# Patient Record
Sex: Female | Born: 1975 | Race: White | Hispanic: No | Marital: Married | Smoking: Former smoker
Health system: Southern US, Community
[De-identification: ages and names within clinical notes are randomized; demographics above are authoritative.]

## PROBLEM LIST (undated history)

## (undated) VITALS — BP 107/89 | HR 89 | Temp 98.3°F | Resp 16 | Ht 66.0 in | Wt 120.0 lb

## (undated) DIAGNOSIS — F32A Depression, unspecified: Secondary | ICD-10-CM

## (undated) DIAGNOSIS — N390 Urinary tract infection, site not specified: Secondary | ICD-10-CM

## (undated) DIAGNOSIS — F1110 Opioid abuse, uncomplicated: Secondary | ICD-10-CM

## (undated) DIAGNOSIS — F329 Major depressive disorder, single episode, unspecified: Secondary | ICD-10-CM

## (undated) DIAGNOSIS — F1910 Other psychoactive substance abuse, uncomplicated: Secondary | ICD-10-CM

## (undated) HISTORY — PX: TUBAL LIGATION: SHX77

## (undated) HISTORY — PX: CHOLECYSTECTOMY: SHX55

---

## 2003-04-17 ENCOUNTER — Emergency Department (HOSPITAL_COMMUNITY): Admission: EM | Admit: 2003-04-17 | Discharge: 2003-04-17 | Payer: Self-pay | Admitting: Emergency Medicine

## 2003-07-15 ENCOUNTER — Emergency Department (HOSPITAL_COMMUNITY): Admission: EM | Admit: 2003-07-15 | Discharge: 2003-07-15 | Payer: Self-pay | Admitting: Emergency Medicine

## 2003-07-20 ENCOUNTER — Emergency Department (HOSPITAL_COMMUNITY): Admission: EM | Admit: 2003-07-20 | Discharge: 2003-07-20 | Payer: Self-pay | Admitting: Emergency Medicine

## 2004-01-31 ENCOUNTER — Emergency Department (HOSPITAL_COMMUNITY): Admission: EM | Admit: 2004-01-31 | Discharge: 2004-02-01 | Payer: Self-pay | Admitting: Emergency Medicine

## 2004-07-14 ENCOUNTER — Emergency Department (HOSPITAL_COMMUNITY): Admission: EM | Admit: 2004-07-14 | Discharge: 2004-07-14 | Payer: Self-pay | Admitting: Emergency Medicine

## 2004-08-17 ENCOUNTER — Emergency Department (HOSPITAL_COMMUNITY): Admission: EM | Admit: 2004-08-17 | Discharge: 2004-08-17 | Payer: Self-pay | Admitting: Family Medicine

## 2004-08-29 ENCOUNTER — Emergency Department (HOSPITAL_COMMUNITY): Admission: EM | Admit: 2004-08-29 | Discharge: 2004-08-29 | Payer: Self-pay | Admitting: *Deleted

## 2004-09-06 ENCOUNTER — Emergency Department (HOSPITAL_COMMUNITY): Admission: EM | Admit: 2004-09-06 | Discharge: 2004-09-06 | Payer: Self-pay | Admitting: Emergency Medicine

## 2004-12-26 ENCOUNTER — Emergency Department (HOSPITAL_COMMUNITY): Admission: EM | Admit: 2004-12-26 | Discharge: 2004-12-26 | Payer: Self-pay | Admitting: *Deleted

## 2005-03-20 ENCOUNTER — Emergency Department (HOSPITAL_COMMUNITY): Admission: EM | Admit: 2005-03-20 | Discharge: 2005-03-20 | Payer: Self-pay | Admitting: Emergency Medicine

## 2005-07-07 ENCOUNTER — Emergency Department (HOSPITAL_COMMUNITY): Admission: EM | Admit: 2005-07-07 | Discharge: 2005-07-07 | Payer: Self-pay | Admitting: Emergency Medicine

## 2005-09-28 ENCOUNTER — Emergency Department (HOSPITAL_COMMUNITY): Admission: EM | Admit: 2005-09-28 | Discharge: 2005-09-28 | Payer: Self-pay | Admitting: Emergency Medicine

## 2008-04-15 ENCOUNTER — Emergency Department (HOSPITAL_COMMUNITY): Admission: EM | Admit: 2008-04-15 | Discharge: 2008-04-15 | Payer: Self-pay | Admitting: Emergency Medicine

## 2008-06-28 ENCOUNTER — Emergency Department (HOSPITAL_COMMUNITY): Admission: EM | Admit: 2008-06-28 | Discharge: 2008-06-28 | Payer: Self-pay | Admitting: Emergency Medicine

## 2008-06-30 ENCOUNTER — Emergency Department (HOSPITAL_COMMUNITY): Admission: EM | Admit: 2008-06-30 | Discharge: 2008-06-30 | Payer: Self-pay | Admitting: Emergency Medicine

## 2008-08-26 ENCOUNTER — Encounter: Admission: RE | Admit: 2008-08-26 | Discharge: 2008-08-26 | Payer: Self-pay | Admitting: Obstetrics and Gynecology

## 2009-03-24 ENCOUNTER — Emergency Department (HOSPITAL_COMMUNITY): Admission: EM | Admit: 2009-03-24 | Discharge: 2009-03-24 | Payer: Self-pay | Admitting: Emergency Medicine

## 2009-06-08 ENCOUNTER — Emergency Department (HOSPITAL_COMMUNITY): Admission: EM | Admit: 2009-06-08 | Discharge: 2009-06-09 | Payer: Self-pay | Admitting: Emergency Medicine

## 2009-06-15 ENCOUNTER — Emergency Department (HOSPITAL_COMMUNITY): Admission: EM | Admit: 2009-06-15 | Discharge: 2009-06-15 | Payer: Self-pay | Admitting: Emergency Medicine

## 2009-10-06 ENCOUNTER — Emergency Department (HOSPITAL_COMMUNITY): Admission: EM | Admit: 2009-10-06 | Discharge: 2009-10-06 | Payer: Self-pay | Admitting: Emergency Medicine

## 2009-11-29 ENCOUNTER — Emergency Department (HOSPITAL_COMMUNITY): Admission: EM | Admit: 2009-11-29 | Discharge: 2009-11-29 | Payer: Self-pay | Admitting: Emergency Medicine

## 2009-12-02 ENCOUNTER — Inpatient Hospital Stay (HOSPITAL_COMMUNITY): Admission: AD | Admit: 2009-12-02 | Discharge: 2009-12-02 | Payer: Self-pay | Admitting: Obstetrics & Gynecology

## 2009-12-02 ENCOUNTER — Ambulatory Visit: Payer: Self-pay | Admitting: Gynecology

## 2010-01-25 ENCOUNTER — Emergency Department (HOSPITAL_COMMUNITY): Admission: EM | Admit: 2010-01-25 | Discharge: 2010-01-25 | Payer: Self-pay | Admitting: Emergency Medicine

## 2010-08-25 LAB — POCT I-STAT, CHEM 8
BUN: 10 mg/dL (ref 6–23)
Calcium, Ion: 1.15 mmol/L (ref 1.12–1.32)
Creatinine, Ser: 0.9 mg/dL (ref 0.4–1.2)
Glucose, Bld: 88 mg/dL (ref 70–99)
Hemoglobin: 14.6 g/dL (ref 12.0–15.0)
TCO2: 25 mmol/L (ref 0–100)

## 2010-08-25 LAB — CBC
HCT: 40.7 % (ref 36.0–46.0)
MCH: 33.6 pg (ref 26.0–34.0)
MCV: 97.6 fL (ref 78.0–100.0)
Platelets: 220 10*3/uL (ref 150–400)
RBC: 4.17 MIL/uL (ref 3.87–5.11)
WBC: 7.7 10*3/uL (ref 4.0–10.5)

## 2010-08-25 LAB — DIFFERENTIAL
Eosinophils Absolute: 0.1 10*3/uL (ref 0.0–0.7)
Eosinophils Relative: 1 % (ref 0–5)
Lymphocytes Relative: 34 % (ref 12–46)
Lymphs Abs: 2.6 10*3/uL (ref 0.7–4.0)
Monocytes Absolute: 0.8 10*3/uL (ref 0.1–1.0)

## 2010-08-26 ENCOUNTER — Inpatient Hospital Stay (HOSPITAL_COMMUNITY)
Admission: AD | Admit: 2010-08-26 | Discharge: 2010-08-26 | Disposition: A | Discharge status: Home / Self Care | Payer: Self-pay | Source: Ambulatory Visit | Attending: Obstetrics & Gynecology | Admitting: Obstetrics & Gynecology

## 2010-08-26 ENCOUNTER — Inpatient Hospital Stay (HOSPITAL_COMMUNITY): Payer: Self-pay

## 2010-08-26 DIAGNOSIS — N83209 Unspecified ovarian cyst, unspecified side: Secondary | ICD-10-CM

## 2010-08-26 DIAGNOSIS — R109 Unspecified abdominal pain: Secondary | ICD-10-CM | POA: Insufficient documentation

## 2010-08-26 DIAGNOSIS — N12 Tubulo-interstitial nephritis, not specified as acute or chronic: Secondary | ICD-10-CM | POA: Insufficient documentation

## 2010-08-26 LAB — URINALYSIS, ROUTINE W REFLEX MICROSCOPIC
Bilirubin Urine: NEGATIVE
Glucose, UA: NEGATIVE mg/dL
Ketones, ur: NEGATIVE mg/dL
Protein, ur: NEGATIVE mg/dL
pH: 5 (ref 5.0–8.0)

## 2010-08-26 LAB — CBC
HCT: 42.3 % (ref 36.0–46.0)
Hemoglobin: 15.1 g/dL — ABNORMAL HIGH (ref 12.0–15.0)
MCH: 33.3 pg (ref 26.0–34.0)
MCV: 93.4 fL (ref 78.0–100.0)
Platelets: 262 10*3/uL (ref 150–400)
RBC: 4.53 MIL/uL (ref 3.87–5.11)
WBC: 11.4 10*3/uL — ABNORMAL HIGH (ref 4.0–10.5)

## 2010-08-26 LAB — WET PREP, GENITAL

## 2010-08-26 LAB — URINE MICROSCOPIC-ADD ON

## 2010-08-27 LAB — GC/CHLAMYDIA PROBE AMP, GENITAL: GC Probe Amp, Genital: NEGATIVE

## 2010-08-28 LAB — URINALYSIS, ROUTINE W REFLEX MICROSCOPIC
Glucose, UA: NEGATIVE mg/dL
Protein, ur: NEGATIVE mg/dL
Specific Gravity, Urine: 1.022 (ref 1.005–1.030)
Urobilinogen, UA: 1 mg/dL (ref 0.0–1.0)

## 2010-08-28 LAB — GC/CHLAMYDIA PROBE AMP, GENITAL

## 2010-08-28 LAB — WET PREP, GENITAL
Yeast Wet Prep HPF POC: NONE SEEN
Yeast Wet Prep HPF POC: NONE SEEN

## 2010-08-28 LAB — CBC
HCT: 41.4 % (ref 36.0–46.0)
HCT: 42.4 % (ref 36.0–46.0)
Hemoglobin: 14.9 g/dL (ref 12.0–15.0)
MCHC: 35.4 g/dL (ref 30.0–36.0)
MCV: 97.9 fL (ref 78.0–100.0)
Platelets: 225 10*3/uL (ref 150–400)
RBC: 4.34 MIL/uL (ref 3.87–5.11)
RDW: 13 % (ref 11.5–15.5)
RDW: 13.1 % (ref 11.5–15.5)
WBC: 6.1 10*3/uL (ref 4.0–10.5)

## 2010-08-28 LAB — URINE CULTURE

## 2010-08-28 LAB — DIFFERENTIAL
Basophils Absolute: 0.1 10*3/uL (ref 0.0–0.1)
Basophils Relative: 1 % (ref 0–1)
Eosinophils Absolute: 0.1 10*3/uL (ref 0.0–0.7)
Eosinophils Relative: 2 % (ref 0–5)

## 2010-08-28 LAB — BASIC METABOLIC PANEL
BUN: 12 mg/dL (ref 6–23)
Chloride: 107 mEq/L (ref 96–112)
Creatinine, Ser: 0.73 mg/dL (ref 0.4–1.2)
Glucose, Bld: 106 mg/dL — ABNORMAL HIGH (ref 70–99)
Potassium: 3.6 mEq/L (ref 3.5–5.1)

## 2010-08-28 LAB — POCT PREGNANCY, URINE: Preg Test, Ur: NEGATIVE

## 2010-08-30 LAB — COMPREHENSIVE METABOLIC PANEL
CO2: 23 mEq/L (ref 19–32)
Calcium: 8.7 mg/dL (ref 8.4–10.5)
Creatinine, Ser: 0.65 mg/dL (ref 0.4–1.2)
GFR calc non Af Amer: >60 mL/min (ref >60)
Glucose, Bld: 99 mg/dL (ref 70–99)
Total Protein: 6.2 g/dL (ref 6.0–8.3)

## 2010-08-30 LAB — DIFFERENTIAL
Lymphocytes Relative: 40 % (ref 12–46)
Lymphs Abs: 2.6 10*3/uL (ref 0.7–4.0)
Neutrophils Relative %: 49 % (ref 43–77)

## 2010-08-30 LAB — URINALYSIS, ROUTINE W REFLEX MICROSCOPIC
Hgb urine dipstick: NEGATIVE
Specific Gravity, Urine: 1.003 — ABNORMAL LOW (ref 1.005–1.030)
Urobilinogen, UA: 0.2 mg/dL (ref 0.0–1.0)

## 2010-08-30 LAB — POCT PREGNANCY, URINE: Preg Test, Ur: NEGATIVE

## 2010-08-30 LAB — CBC
Hemoglobin: 13.2 g/dL (ref 12.0–15.0)
MCHC: 34.3 g/dL (ref 30.0–36.0)
MCV: 98.4 fL (ref 78.0–100.0)
RBC: 3.92 MIL/uL (ref 3.87–5.11)

## 2010-08-30 LAB — GC/CHLAMYDIA PROBE AMP, GENITAL
Chlamydia, DNA Probe: NEGATIVE
GC Probe Amp, Genital: NEGATIVE

## 2010-08-30 LAB — WET PREP, GENITAL: Trich, Wet Prep: NONE SEEN

## 2010-08-30 LAB — LIPASE, BLOOD: Lipase: 23 U/L (ref 11–59)

## 2010-09-01 ENCOUNTER — Inpatient Hospital Stay (HOSPITAL_COMMUNITY)
Admission: AD | Admit: 2010-09-01 | Discharge: 2010-09-01 | Disposition: A | Discharge status: Home / Self Care | Payer: Self-pay | Source: Ambulatory Visit | Attending: Obstetrics and Gynecology | Admitting: Obstetrics and Gynecology

## 2010-09-01 ENCOUNTER — Inpatient Hospital Stay (HOSPITAL_COMMUNITY): Payer: Self-pay

## 2010-09-01 DIAGNOSIS — N390 Urinary tract infection, site not specified: Secondary | ICD-10-CM

## 2010-09-01 DIAGNOSIS — N949 Unspecified condition associated with female genital organs and menstrual cycle: Secondary | ICD-10-CM | POA: Insufficient documentation

## 2010-09-01 LAB — RAPID URINE DRUG SCREEN, HOSP PERFORMED
Amphetamines: NOT DETECTED
Benzodiazepines: POSITIVE — AB
Cocaine: NOT DETECTED
Tetrahydrocannabinol: POSITIVE — AB

## 2010-09-01 LAB — URINE MICROSCOPIC-ADD ON

## 2010-09-01 LAB — URINALYSIS, ROUTINE W REFLEX MICROSCOPIC
Nitrite: NEGATIVE
Protein, ur: NEGATIVE mg/dL
Specific Gravity, Urine: 1.025 (ref 1.005–1.030)
Urobilinogen, UA: 0.2 mg/dL (ref 0.0–1.0)

## 2010-09-01 LAB — CBC
MCHC: 35 g/dL (ref 30.0–36.0)
RDW: 13 % (ref 11.5–15.5)
WBC: 8.6 10*3/uL (ref 4.0–10.5)

## 2010-09-15 ENCOUNTER — Emergency Department (HOSPITAL_COMMUNITY)
Admission: EM | Admit: 2010-09-15 | Discharge: 2010-09-15 | Disposition: A | Discharge status: Home / Self Care | Payer: Self-pay | Attending: Emergency Medicine | Admitting: Emergency Medicine

## 2010-09-15 DIAGNOSIS — F3289 Other specified depressive episodes: Secondary | ICD-10-CM | POA: Insufficient documentation

## 2010-09-15 DIAGNOSIS — R319 Hematuria, unspecified: Secondary | ICD-10-CM | POA: Insufficient documentation

## 2010-09-15 DIAGNOSIS — R109 Unspecified abdominal pain: Secondary | ICD-10-CM | POA: Insufficient documentation

## 2010-09-15 DIAGNOSIS — F329 Major depressive disorder, single episode, unspecified: Secondary | ICD-10-CM | POA: Insufficient documentation

## 2010-09-15 DIAGNOSIS — F17200 Nicotine dependence, unspecified, uncomplicated: Secondary | ICD-10-CM | POA: Insufficient documentation

## 2010-09-15 DIAGNOSIS — M545 Low back pain, unspecified: Secondary | ICD-10-CM | POA: Insufficient documentation

## 2010-09-15 DIAGNOSIS — N390 Urinary tract infection, site not specified: Secondary | ICD-10-CM | POA: Insufficient documentation

## 2010-09-15 DIAGNOSIS — R10819 Abdominal tenderness, unspecified site: Secondary | ICD-10-CM | POA: Insufficient documentation

## 2010-09-15 DIAGNOSIS — M549 Dorsalgia, unspecified: Secondary | ICD-10-CM | POA: Insufficient documentation

## 2010-09-15 DIAGNOSIS — R300 Dysuria: Secondary | ICD-10-CM | POA: Insufficient documentation

## 2010-09-15 LAB — CBC
HCT: 41 % (ref 36.0–46.0)
Hemoglobin: 14.3 g/dL (ref 12.0–15.0)
RDW: 12.7 % (ref 11.5–15.5)

## 2010-09-15 LAB — URINALYSIS, ROUTINE W REFLEX MICROSCOPIC
Bilirubin Urine: NEGATIVE
Ketones, ur: NEGATIVE mg/dL
Nitrite: NEGATIVE
Protein, ur: NEGATIVE mg/dL
Urobilinogen, UA: 1 mg/dL (ref 0.0–1.0)
pH: 6.5 (ref 5.0–8.0)

## 2010-09-15 LAB — POCT I-STAT, CHEM 8
BUN: 8 mg/dL (ref 6–23)
Calcium, Ion: 1.17 mmol/L (ref 1.12–1.32)
Creatinine, Ser: 0.6 mg/dL (ref 0.4–1.2)
TCO2: 30 mmol/L (ref 0–100)

## 2010-09-15 LAB — DIFFERENTIAL
Basophils Absolute: 0 10*3/uL (ref 0.0–0.1)
Eosinophils Relative: 2 % (ref 0–5)
Lymphocytes Relative: 24 % (ref 12–46)
Monocytes Absolute: 0.4 10*3/uL (ref 0.1–1.0)

## 2010-09-15 LAB — URINE MICROSCOPIC-ADD ON

## 2010-09-15 LAB — POCT PREGNANCY, URINE: Preg Test, Ur: NEGATIVE

## 2010-09-25 ENCOUNTER — Inpatient Hospital Stay (HOSPITAL_COMMUNITY)
Admission: AD | Admit: 2010-09-25 | Discharge: 2010-09-25 | Disposition: A | Discharge status: Home / Self Care | Payer: Self-pay | Source: Ambulatory Visit | Attending: Obstetrics & Gynecology | Admitting: Obstetrics & Gynecology

## 2010-09-25 DIAGNOSIS — N760 Acute vaginitis: Secondary | ICD-10-CM

## 2010-09-25 DIAGNOSIS — N83209 Unspecified ovarian cyst, unspecified side: Secondary | ICD-10-CM | POA: Insufficient documentation

## 2010-09-25 DIAGNOSIS — A499 Bacterial infection, unspecified: Secondary | ICD-10-CM

## 2010-09-25 DIAGNOSIS — B9689 Other specified bacterial agents as the cause of diseases classified elsewhere: Secondary | ICD-10-CM | POA: Insufficient documentation

## 2010-09-25 LAB — CBC
HCT: 39.4 % (ref 36.0–46.0)
Hemoglobin: 13.6 g/dL (ref 12.0–15.0)
MCH: 32.6 pg (ref 26.0–34.0)
MCHC: 34.5 g/dL (ref 30.0–36.0)

## 2010-09-25 LAB — URINALYSIS, ROUTINE W REFLEX MICROSCOPIC
Bilirubin Urine: NEGATIVE
Glucose, UA: NEGATIVE mg/dL
Hgb urine dipstick: NEGATIVE
Ketones, ur: NEGATIVE mg/dL
pH: 8 (ref 5.0–8.0)

## 2010-09-25 LAB — POCT PREGNANCY, URINE: Preg Test, Ur: NEGATIVE

## 2010-10-05 ENCOUNTER — Encounter: Payer: Self-pay | Admitting: Family Medicine

## 2010-10-11 ENCOUNTER — Other Ambulatory Visit: Payer: Self-pay | Admitting: Obstetrics & Gynecology

## 2010-10-11 DIAGNOSIS — N83202 Unspecified ovarian cyst, left side: Secondary | ICD-10-CM

## 2010-10-12 ENCOUNTER — Ambulatory Visit (HOSPITAL_COMMUNITY): Payer: Self-pay | Attending: Obstetrics & Gynecology

## 2010-10-27 ENCOUNTER — Encounter: Payer: Self-pay | Admitting: Family Medicine

## 2010-10-27 ENCOUNTER — Inpatient Hospital Stay (HOSPITAL_COMMUNITY)
Admission: AD | Admit: 2010-10-27 | Discharge: 2010-10-27 | Disposition: A | Discharge status: Home / Self Care | Payer: Self-pay | Source: Ambulatory Visit | Attending: Family Medicine | Admitting: Family Medicine

## 2010-10-27 ENCOUNTER — Inpatient Hospital Stay (HOSPITAL_COMMUNITY): Payer: Self-pay

## 2010-10-27 DIAGNOSIS — B9689 Other specified bacterial agents as the cause of diseases classified elsewhere: Secondary | ICD-10-CM | POA: Insufficient documentation

## 2010-10-27 DIAGNOSIS — A499 Bacterial infection, unspecified: Secondary | ICD-10-CM

## 2010-10-27 DIAGNOSIS — R109 Unspecified abdominal pain: Secondary | ICD-10-CM

## 2010-10-27 DIAGNOSIS — N83209 Unspecified ovarian cyst, unspecified side: Secondary | ICD-10-CM

## 2010-10-27 DIAGNOSIS — N760 Acute vaginitis: Secondary | ICD-10-CM | POA: Insufficient documentation

## 2010-10-27 LAB — URINALYSIS, ROUTINE W REFLEX MICROSCOPIC
Ketones, ur: NEGATIVE mg/dL
Leukocytes, UA: NEGATIVE
Nitrite: NEGATIVE
Specific Gravity, Urine: >1.03 — ABNORMAL HIGH (ref 1.005–1.030)
Urobilinogen, UA: 0.2 mg/dL (ref 0.0–1.0)
pH: 5.5 (ref 5.0–8.0)

## 2010-10-27 LAB — CBC
MCV: 95.8 fL (ref 78.0–100.0)
Platelets: 281 10*3/uL (ref 150–400)
RBC: 4.57 MIL/uL (ref 3.87–5.11)
WBC: 11.8 10*3/uL — ABNORMAL HIGH (ref 4.0–10.5)

## 2010-10-27 LAB — WET PREP, GENITAL: Trich, Wet Prep: NONE SEEN

## 2010-10-27 LAB — URINE MICROSCOPIC-ADD ON

## 2010-10-27 LAB — POCT PREGNANCY, URINE: Preg Test, Ur: NEGATIVE

## 2010-10-28 LAB — GC/CHLAMYDIA PROBE AMP, GENITAL: Chlamydia, DNA Probe: NEGATIVE

## 2010-11-08 ENCOUNTER — Inpatient Hospital Stay (HOSPITAL_COMMUNITY)
Admission: AD | Admit: 2010-11-08 | Discharge: 2010-11-08 | Disposition: A | Discharge status: Home / Self Care | Payer: Self-pay | Source: Ambulatory Visit | Attending: Family Medicine | Admitting: Family Medicine

## 2010-11-08 DIAGNOSIS — N390 Urinary tract infection, site not specified: Secondary | ICD-10-CM | POA: Insufficient documentation

## 2010-11-08 LAB — URINE MICROSCOPIC-ADD ON

## 2010-11-08 LAB — URINALYSIS, ROUTINE W REFLEX MICROSCOPIC
Bilirubin Urine: NEGATIVE
Glucose, UA: NEGATIVE mg/dL
Specific Gravity, Urine: 1.025 (ref 1.005–1.030)
Urobilinogen, UA: 0.2 mg/dL (ref 0.0–1.0)
pH: 6 (ref 5.0–8.0)

## 2010-11-08 LAB — WET PREP, GENITAL: Yeast Wet Prep HPF POC: NONE SEEN

## 2010-11-30 ENCOUNTER — Encounter: Payer: Self-pay | Admitting: Obstetrics and Gynecology

## 2011-01-09 ENCOUNTER — Emergency Department (HOSPITAL_COMMUNITY)
Admission: EM | Admit: 2011-01-09 | Discharge: 2011-01-09 | Disposition: A | Discharge status: Home / Self Care | Payer: Self-pay | Attending: Emergency Medicine | Admitting: Emergency Medicine

## 2011-01-09 ENCOUNTER — Emergency Department (HOSPITAL_COMMUNITY): Payer: Self-pay

## 2011-01-09 DIAGNOSIS — M79609 Pain in unspecified limb: Secondary | ICD-10-CM | POA: Insufficient documentation

## 2011-01-09 DIAGNOSIS — N938 Other specified abnormal uterine and vaginal bleeding: Secondary | ICD-10-CM | POA: Insufficient documentation

## 2011-01-09 DIAGNOSIS — IMO0002 Reserved for concepts with insufficient information to code with codable children: Secondary | ICD-10-CM | POA: Insufficient documentation

## 2011-01-09 DIAGNOSIS — Y9352 Activity, horseback riding: Secondary | ICD-10-CM | POA: Insufficient documentation

## 2011-01-09 DIAGNOSIS — N898 Other specified noninflammatory disorders of vagina: Secondary | ICD-10-CM | POA: Insufficient documentation

## 2011-01-09 DIAGNOSIS — N949 Unspecified condition associated with female genital organs and menstrual cycle: Secondary | ICD-10-CM | POA: Insufficient documentation

## 2011-01-09 DIAGNOSIS — Z23 Encounter for immunization: Secondary | ICD-10-CM | POA: Insufficient documentation

## 2011-02-27 ENCOUNTER — Emergency Department (HOSPITAL_COMMUNITY)
Admission: EM | Admit: 2011-02-27 | Discharge: 2011-02-27 | Disposition: A | Discharge status: Home / Self Care | Payer: Self-pay | Attending: Emergency Medicine | Admitting: Emergency Medicine

## 2011-02-27 ENCOUNTER — Emergency Department (HOSPITAL_COMMUNITY): Payer: Self-pay

## 2011-02-27 DIAGNOSIS — R05 Cough: Secondary | ICD-10-CM | POA: Insufficient documentation

## 2011-02-27 DIAGNOSIS — B9789 Other viral agents as the cause of diseases classified elsewhere: Secondary | ICD-10-CM | POA: Insufficient documentation

## 2011-02-27 DIAGNOSIS — J40 Bronchitis, not specified as acute or chronic: Secondary | ICD-10-CM | POA: Insufficient documentation

## 2011-02-27 DIAGNOSIS — R059 Cough, unspecified: Secondary | ICD-10-CM | POA: Insufficient documentation

## 2011-04-22 IMAGING — US US ART/VEN ABD/PELV/SCROTUM DOPPLER LTD
1 series · 13 of 25 positions shown · non-contrast
Comparison: 08/26/2010

CLINICAL DATA: P 08/11/2010. Right lower quadrant pain with vaginal
bleeding for 3 weeks

TRANSABDOMINAL AND TRANSVAGINAL ULTRASOUND OF PELVIS
DOPPLER ULTRASOUND OF OVARIES
TECHNIQUE: Both transabdominal and transvaginal ultrasound
examinations of the pelvis were performed. Transabdominal technique
was performed for global imaging of the pelvis including uterus,
ovaries, adnexal regions, and pelvic cul-de-sac.
It was necessary to proceed with endovaginal exam following the
transabdominal exam to visualize the endometrial lining and
ovaries.
Color and duplex Doppler ultrasound was utilized to evaluate blood
flow to the ovaries.

[Series 1: us pelvis complete · 13 of 77 slices shown]
[im 1/77]
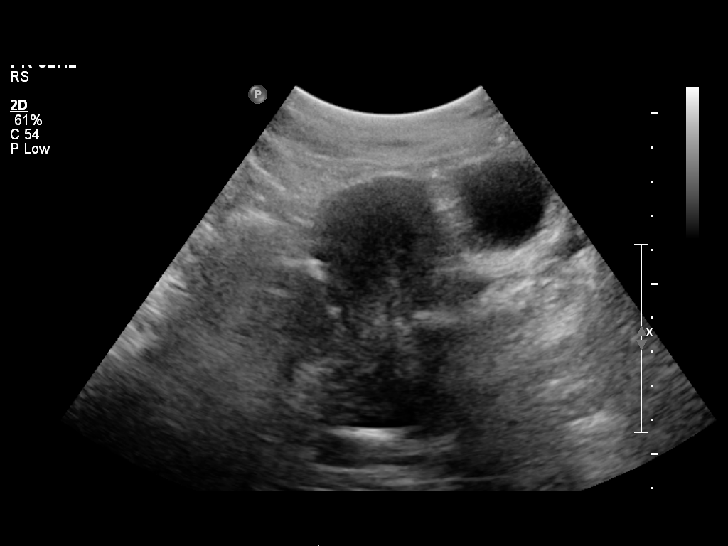
[im 7/77]
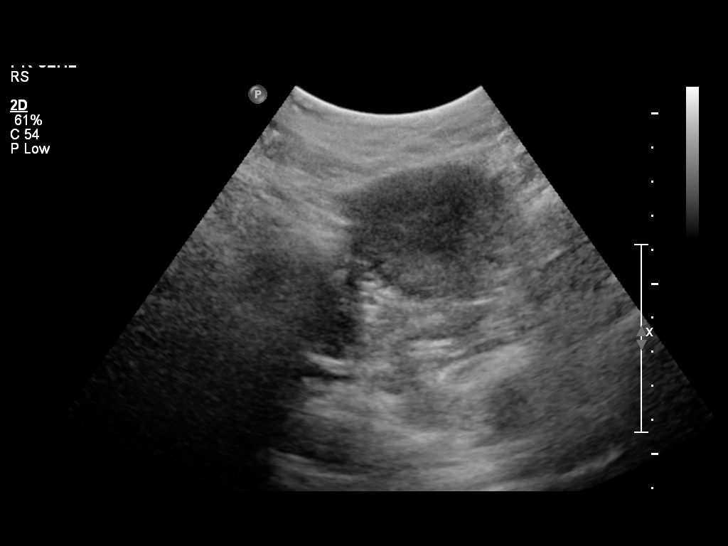
[im 13/77]
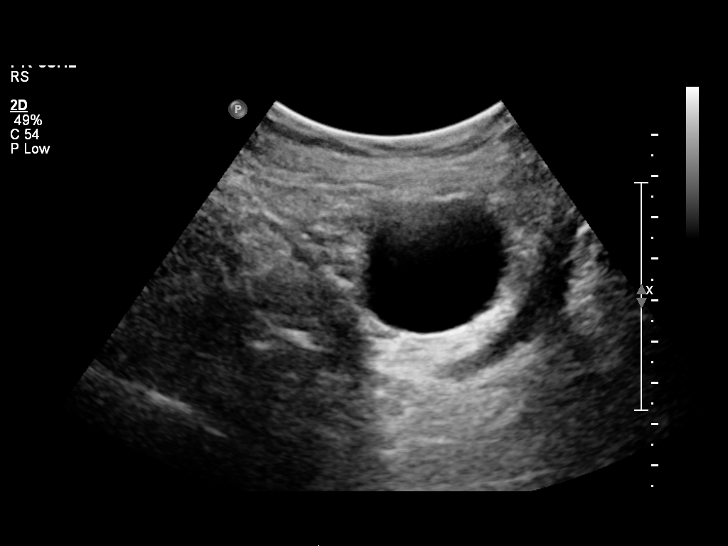
[im 20/77]
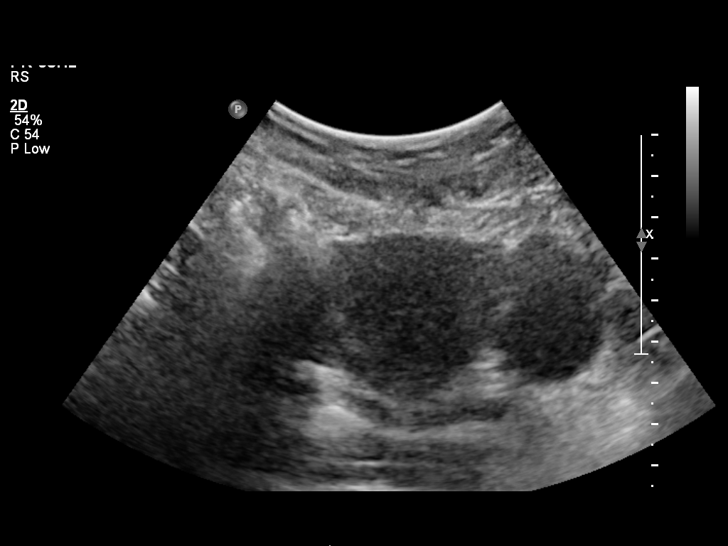
[im 26/77]
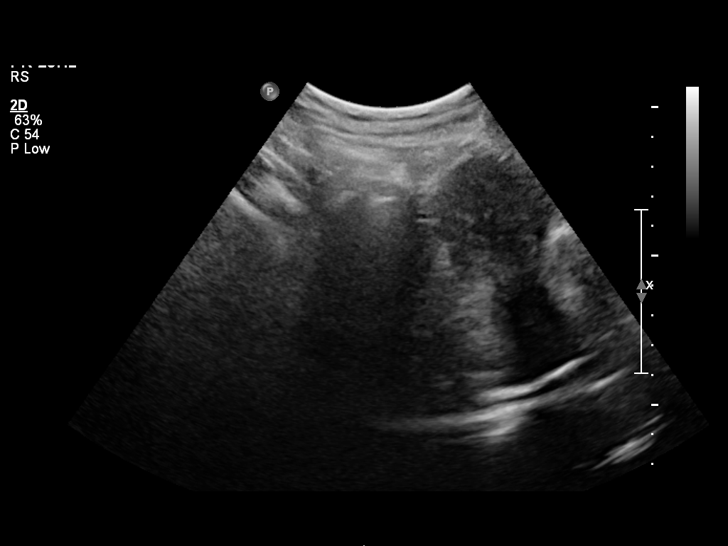
[im 32/77]
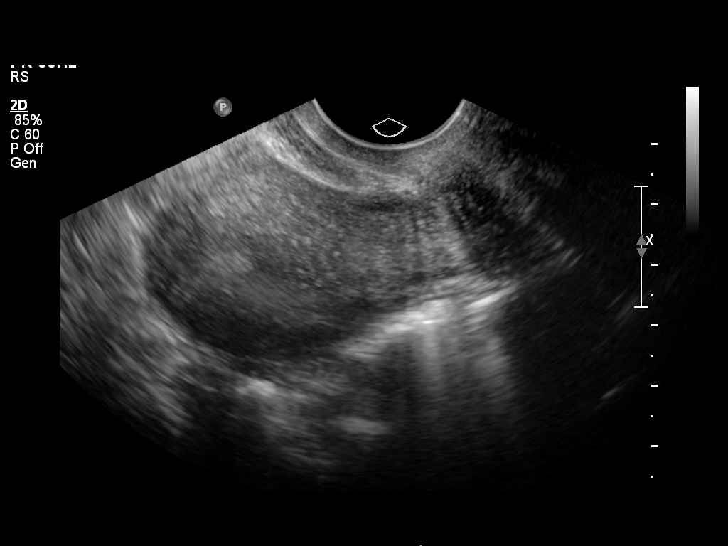
[im 39/77]
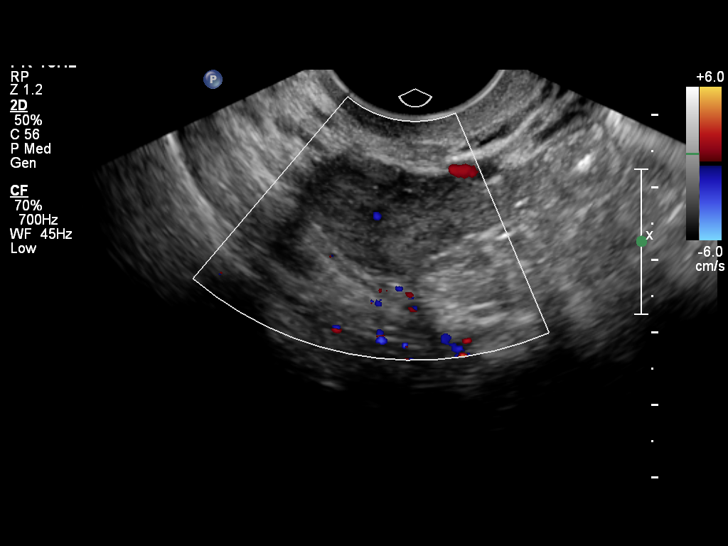
[im 45/77]
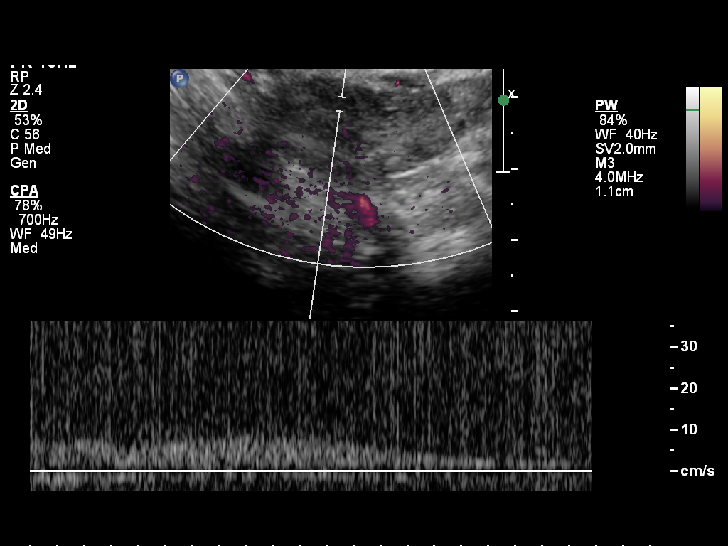
[im 51/77]
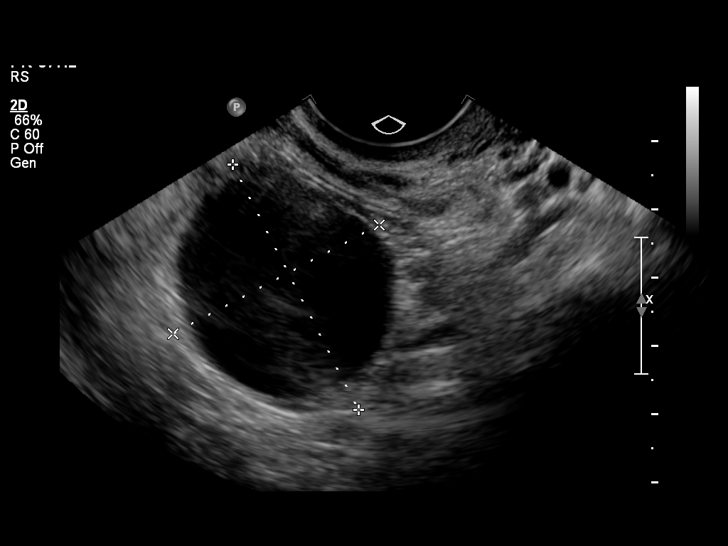
[im 58/77]
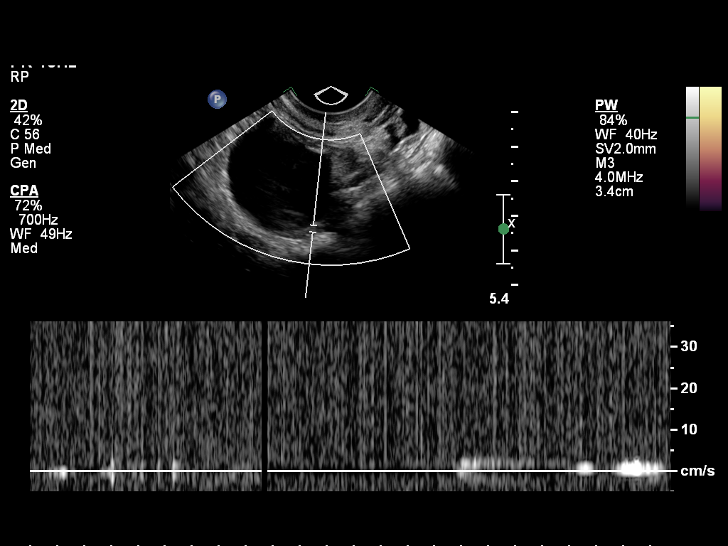
[im 64/77]
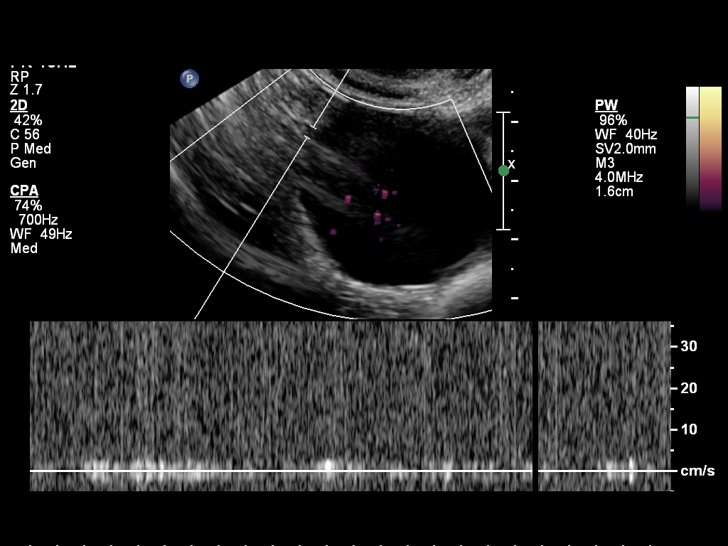
[im 70/77]
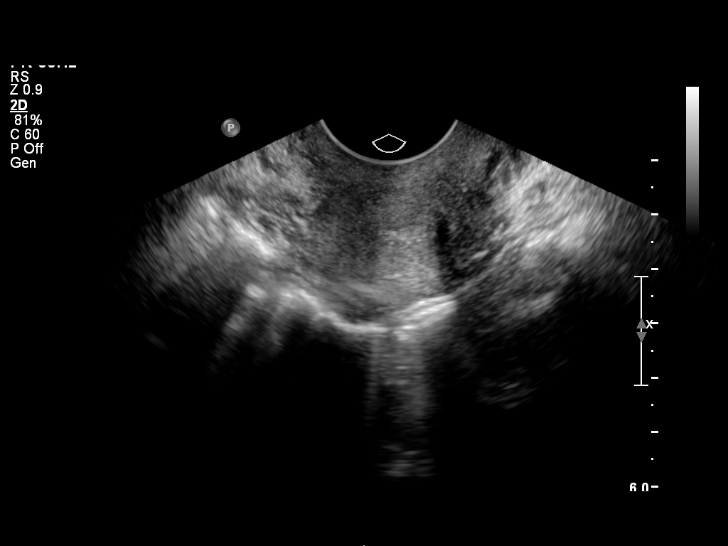
[im 77/77]
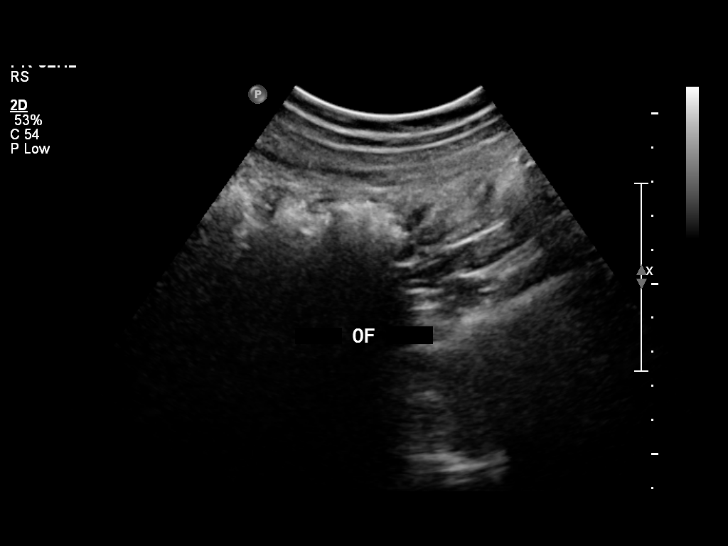

[13 of 25 positions shown; findings below may reference images not displayed]

FINDINGS: Uterus:  Has a sagittal length of 9.5 cm, an AP depth of 3.9 cm and
a transverse width of 4.8 cm.  A homogeneous uterine myometrium is
seen.  No fibroids or other uterine masses identified.

Endometrium:  Is homogeneously echogenic with an AP width of
mm.  No areas of focal thickening or heterogeneity are seen.

Right ovary:  Has a normal appearance measuring 2.2 x 1.6 x 2.6 cm.
.

Left ovary:  Measures 4.0 x 3.4 x 3.6 cm and contains a unilocular
simple cyst measuring 3.3 x 2.9 x 3.1 cm.  This is not
significantly changed in size since the recent exam of 08/26/2010.
No internal echoes are seen today.

Other findings:  No free fluid. Evaluation in the region of the
patient's pain reveals  underlying bowel.

Pulsed Doppler evaluation demonstrates normal low-resistance
arterial and venous waveforms in both ovaries.
IMPRESSION: Left ovarian cyst, stable in size, appearing simple today. The
location of the patient's pain is above the level of the right
ovary.

No sonographic evidence for ovarian torsion.

## 2011-06-17 IMAGING — US US TRANSVAGINAL NON-OB
1 series · 14 of 25 positions shown · non-contrast
Comparison: 09/01/2010

CLINICAL DATA: Left lower quadrant pain.

TRANSVAGINAL ULTRASOUND OF PELVIS
TECHNIQUE: Transvaginal ultrasound examination of the pelvis was
performed including evaluation of the uterus, ovaries, adnexal
regions, and pelvic cul-de-sac.

[Series 1: us transvaginal non-ob · 14 of 36 slices shown]
[im 1/36]
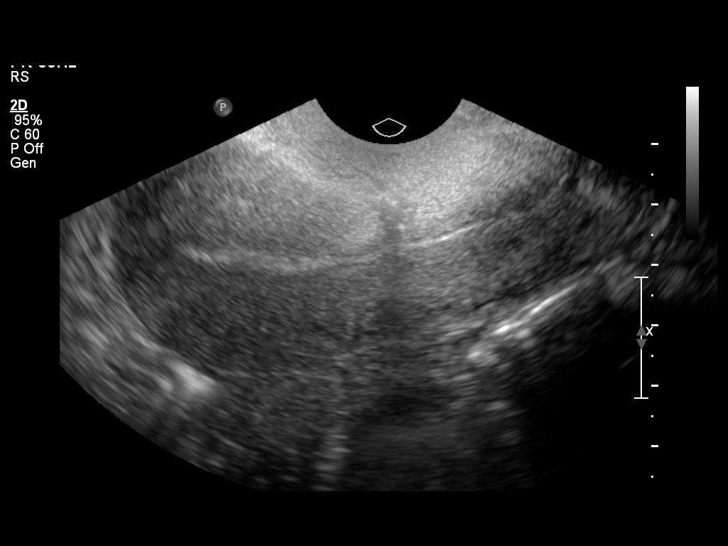
[im 3/36]
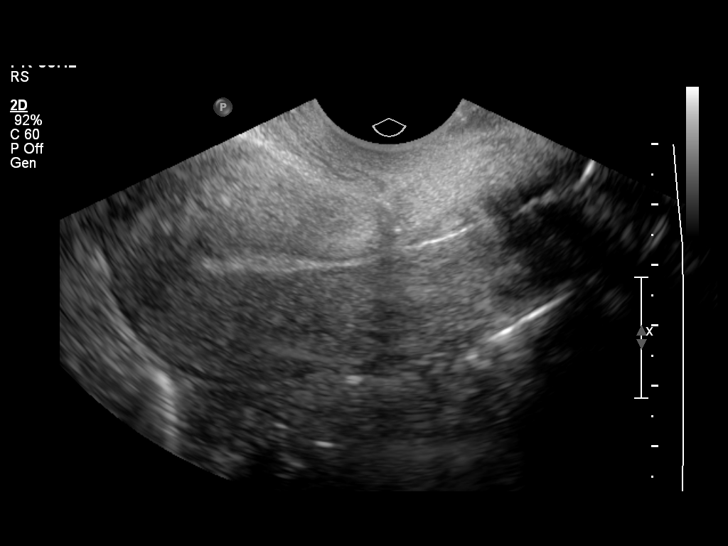
[im 6/36]
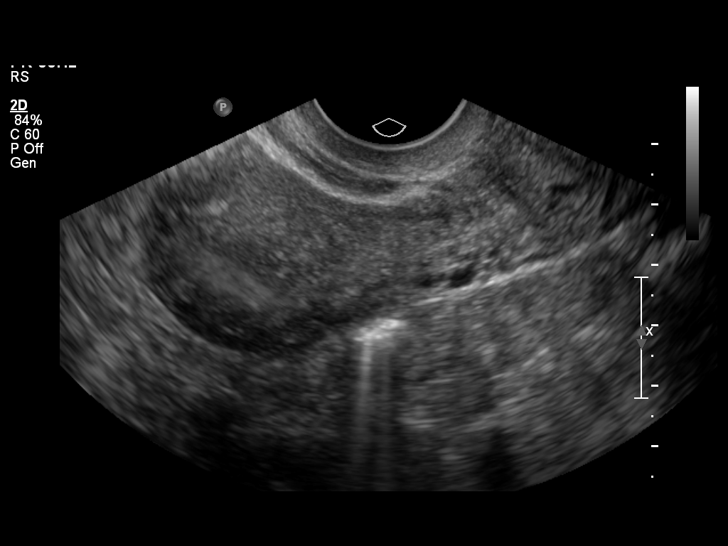
[im 9/36]
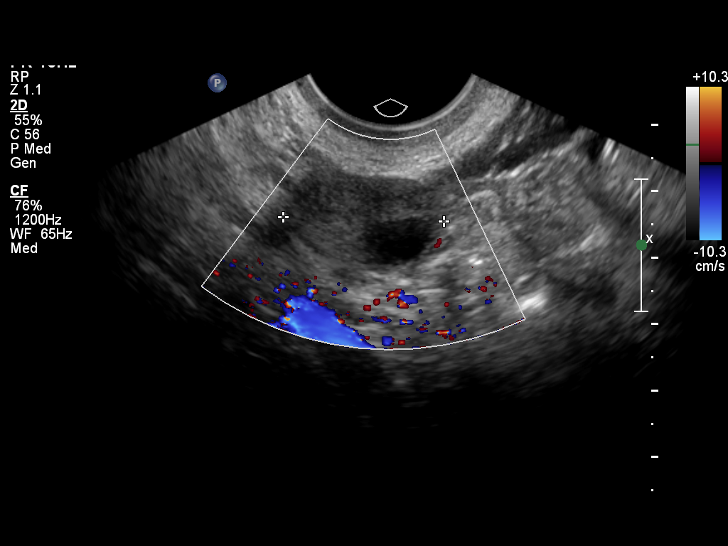
[im 12/36]
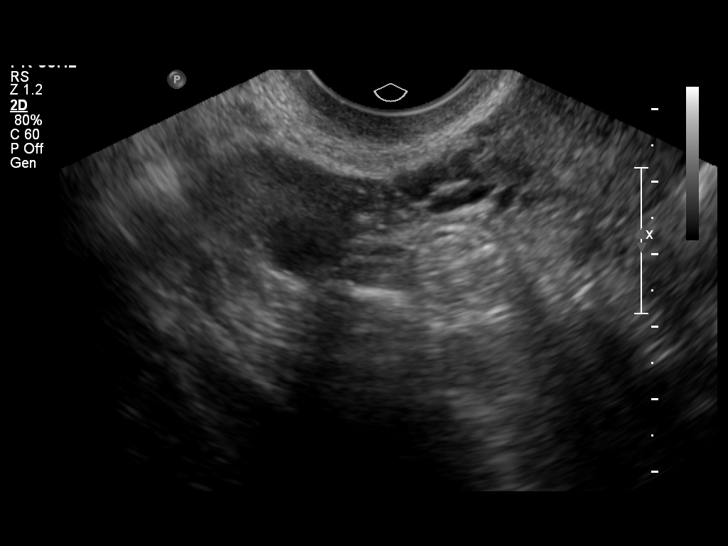
[im 14/36]
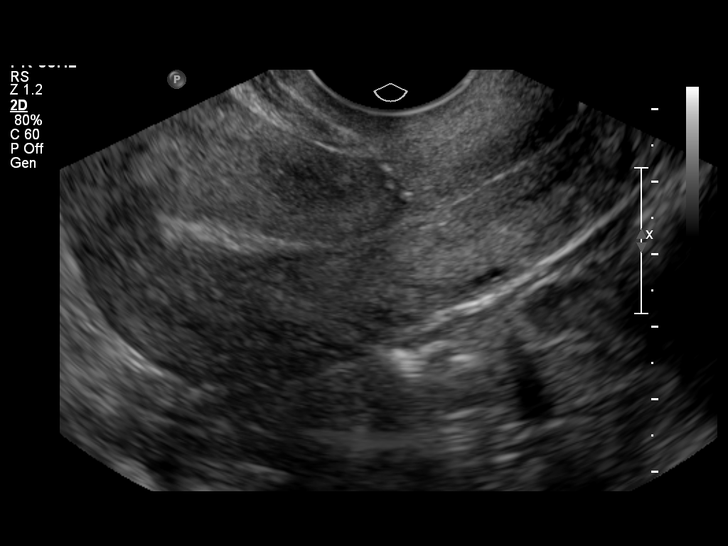
[im 17/36]
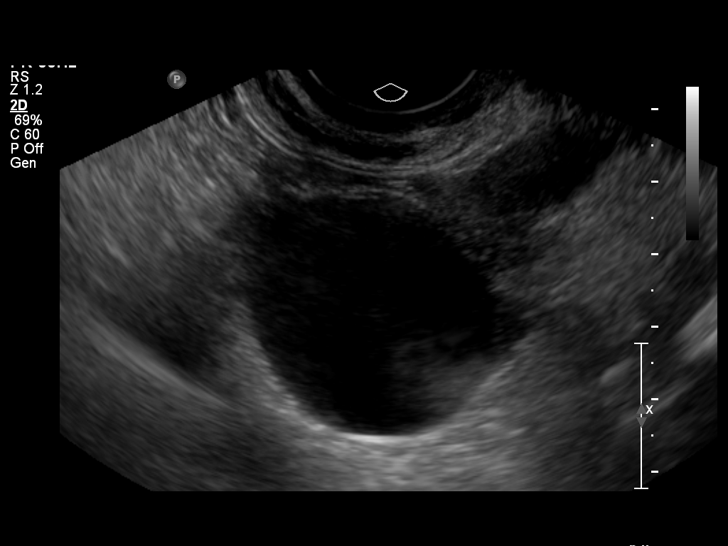
[im 19/36]
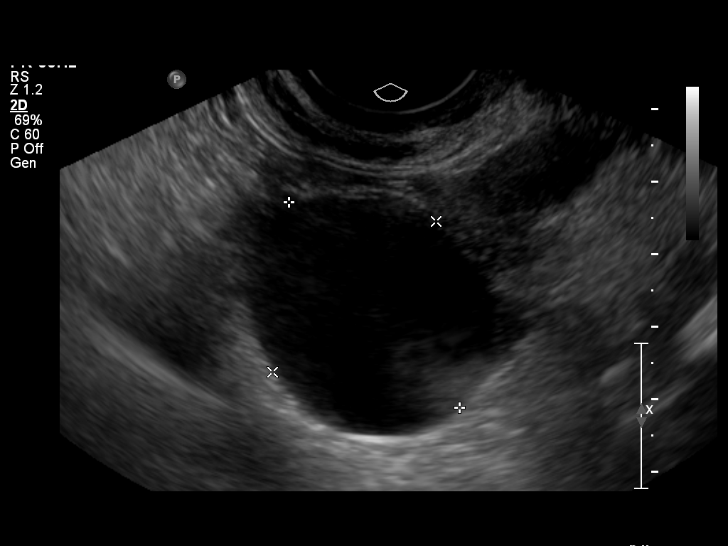
[im 22/36]
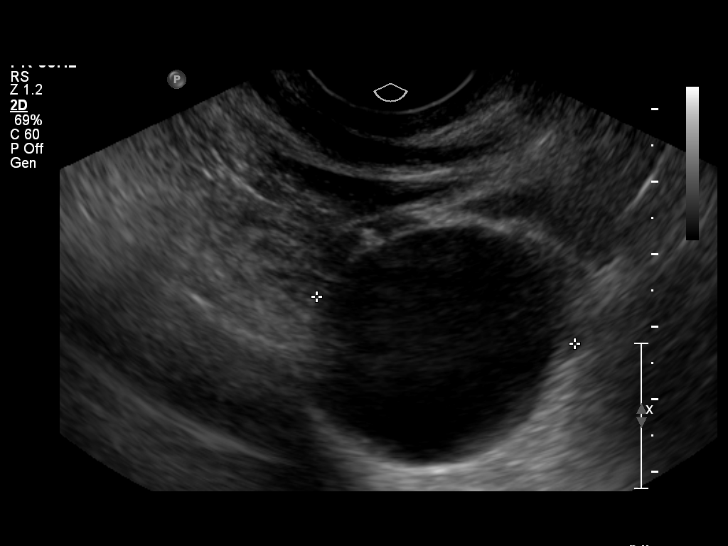
[im 24/36]
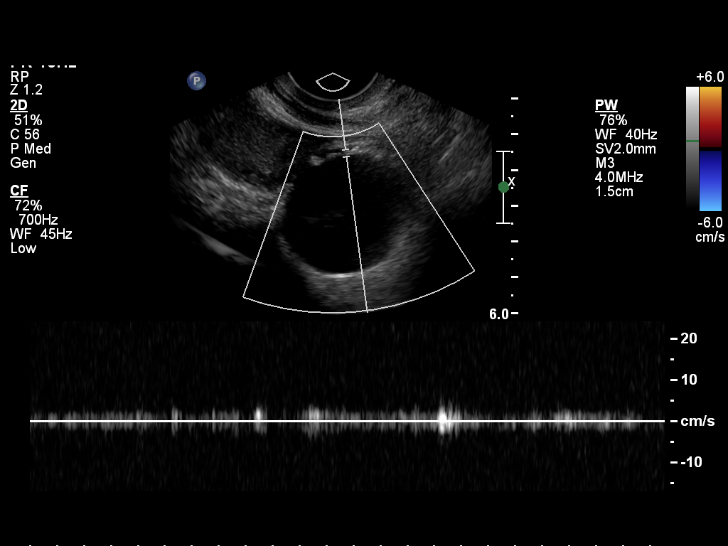
[im 27/36]
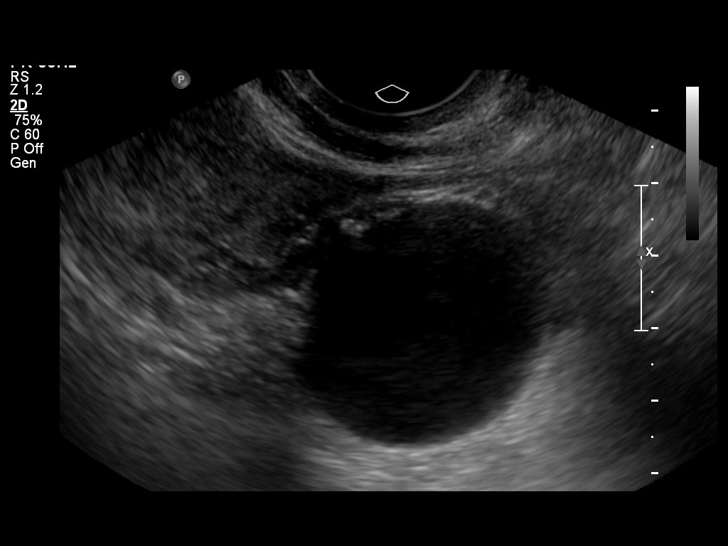
[im 30/36]
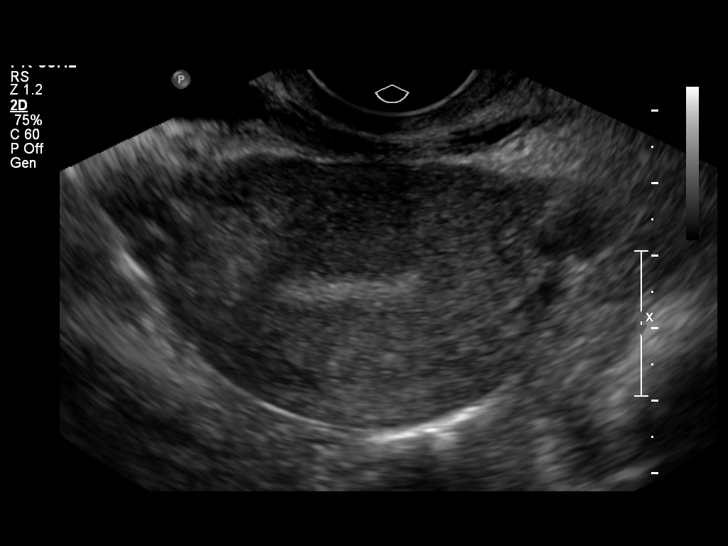
[im 33/36]
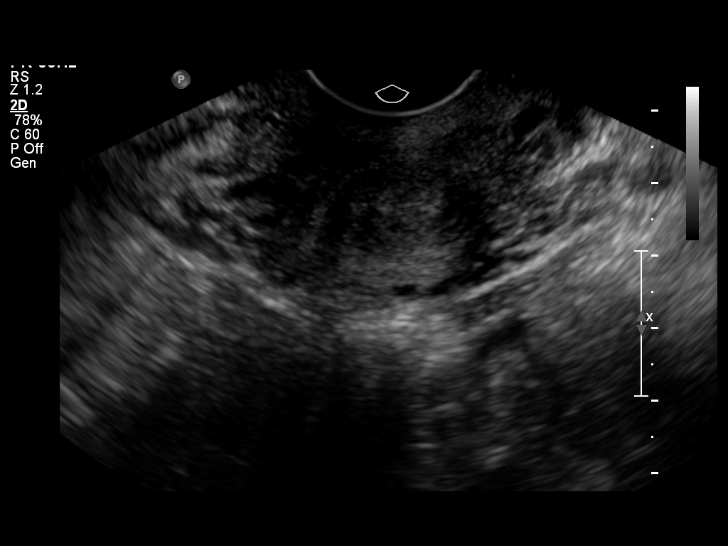
[im 36/36]
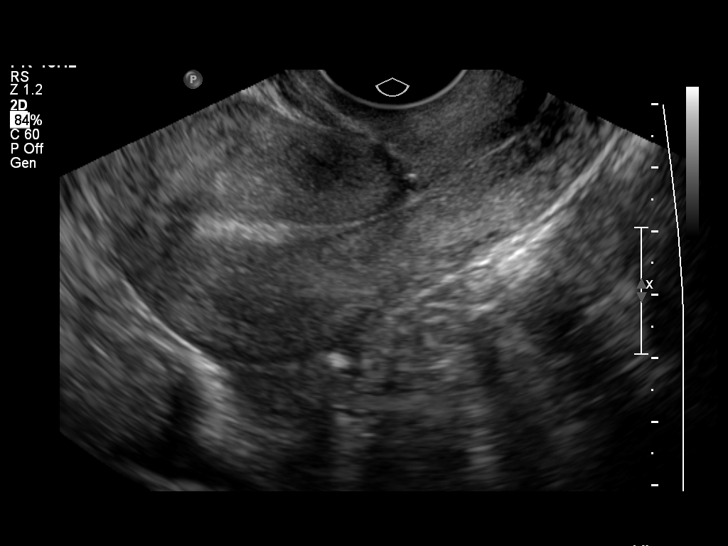

[14 of 25 positions shown; findings below may reference images not displayed]

FINDINGS: Uterus 9.0 x 3.9 x 5.4 cm.  Normal appearance.

Endometrium 4 mm in thickness, normal.

Right Ovary 2.6 x 1.7 x 2.4 cm.  Normal.

Left Ovary 4.1 x 3.3 x 3.6 cm.  There is a simple appearing 3.7 x
3.1 x 3.1 cm cyst, essentially unchanged since the prior exam.

Other Findings:  No free fluid.
IMPRESSION: Stable appearance of the pelvis since the prior exam of 09/01/2010.
Small benign appearing cyst on the left ovary is unchanged.

## 2011-06-20 ENCOUNTER — Emergency Department (HOSPITAL_COMMUNITY): Payer: No Typology Code available for payment source

## 2011-06-20 ENCOUNTER — Encounter (HOSPITAL_COMMUNITY): Payer: Self-pay | Admitting: *Deleted

## 2011-06-20 ENCOUNTER — Emergency Department (HOSPITAL_COMMUNITY)
Admission: EM | Admit: 2011-06-20 | Discharge: 2011-06-20 | Disposition: A | Discharge status: Home / Self Care | Payer: No Typology Code available for payment source | Attending: Emergency Medicine | Admitting: Emergency Medicine

## 2011-06-20 DIAGNOSIS — M542 Cervicalgia: Secondary | ICD-10-CM | POA: Insufficient documentation

## 2011-06-20 DIAGNOSIS — S0083XA Contusion of other part of head, initial encounter: Secondary | ICD-10-CM | POA: Insufficient documentation

## 2011-06-20 DIAGNOSIS — S0003XA Contusion of scalp, initial encounter: Secondary | ICD-10-CM | POA: Insufficient documentation

## 2011-06-20 DIAGNOSIS — IMO0002 Reserved for concepts with insufficient information to code with codable children: Secondary | ICD-10-CM

## 2011-06-20 MED ORDER — IBUPROFEN 800 MG PO TABS
800.0000 mg | ORAL_TABLET | Freq: Once | ORAL | Status: AC
  Administered 2011-06-20: 800 mg via ORAL
  Filled 2011-06-20: qty 1

## 2011-06-20 MED ORDER — METRONIDAZOLE 500 MG PO TABS
ORAL_TABLET | ORAL | Status: AC
  Administered 2011-06-20: 2000 mg via ORAL
  Filled 2011-06-20: qty 4

## 2011-06-20 MED ORDER — IBUPROFEN 800 MG PO TABS: 800.0000 mg | ORAL_TABLET | Freq: Three times a day (TID) | ORAL | Status: AC | PRN

## 2011-06-20 MED ORDER — AZITHROMYCIN 1 G PO PACK
PACK | ORAL | Status: AC
  Administered 2011-06-20: 1 g via ORAL
  Filled 2011-06-20: qty 1

## 2011-06-20 MED ORDER — PROMETHAZINE HCL 25 MG PO TABS
ORAL_TABLET | ORAL | Status: AC
  Administered 2011-06-20: 75 mg
  Filled 2011-06-20: qty 3

## 2011-06-20 MED ORDER — CEFIXIME 400 MG PO TABS
ORAL_TABLET | ORAL | Status: AC
  Administered 2011-06-20: 400 mg via ORAL
  Filled 2011-06-20: qty 1

## 2011-06-20 MED ORDER — LEVONORGESTREL 0.75 MG PO TABS
ORAL_TABLET | ORAL | Status: AC
  Administered 2011-06-20: 1.5 mg via ORAL
  Filled 2011-06-20: qty 2
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
## 2011-06-20 NOTE — ED Provider Notes (Signed)
Medical screening examination/treatment/procedure(s) were performed by non-physician practitioner and as supervising physician I was immediately available for consultation/collaboration.   Nat Christen, MD 06/20/11 2351
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
## 2011-06-20 NOTE — ED Notes (Addendum)
Leisure centre manager to dept to eval pt

## 2011-06-22 LAB — POCT PREGNANCY, URINE: Preg Test, Ur: NEGATIVE

## 2011-09-17 ENCOUNTER — Encounter (HOSPITAL_COMMUNITY): Payer: Self-pay | Admitting: Obstetrics and Gynecology

## 2011-09-17 ENCOUNTER — Inpatient Hospital Stay (HOSPITAL_COMMUNITY)
Admission: AD | Admit: 2011-09-17 | Discharge: 2011-09-17 | Disposition: A | Discharge status: Home / Self Care | Payer: Self-pay | Source: Ambulatory Visit | Attending: Obstetrics and Gynecology | Admitting: Obstetrics and Gynecology

## 2011-09-17 DIAGNOSIS — N644 Mastodynia: Secondary | ICD-10-CM | POA: Insufficient documentation

## 2011-09-17 DIAGNOSIS — R079 Chest pain, unspecified: Secondary | ICD-10-CM | POA: Insufficient documentation

## 2011-09-17 DIAGNOSIS — N390 Urinary tract infection, site not specified: Secondary | ICD-10-CM

## 2011-09-17 DIAGNOSIS — R300 Dysuria: Secondary | ICD-10-CM | POA: Insufficient documentation

## 2011-09-17 DIAGNOSIS — R0789 Other chest pain: Secondary | ICD-10-CM

## 2011-09-17 HISTORY — DX: Depression, unspecified: F32.A

## 2011-09-17 HISTORY — DX: Urinary tract infection, site not specified: N39.0

## 2011-09-17 HISTORY — DX: Major depressive disorder, single episode, unspecified: F32.9

## 2011-09-17 LAB — URINALYSIS, ROUTINE W REFLEX MICROSCOPIC
Glucose, UA: NEGATIVE mg/dL
Ketones, ur: 15 mg/dL — AB
Leukocytes, UA: NEGATIVE
Nitrite: POSITIVE — AB
pH: 6 (ref 5.0–8.0)

## 2011-09-17 LAB — WET PREP, GENITAL
Clue Cells Wet Prep HPF POC: NONE SEEN
Trich, Wet Prep: NONE SEEN

## 2011-09-17 LAB — URINE MICROSCOPIC-ADD ON

## 2011-09-17 LAB — POCT PREGNANCY, URINE: Preg Test, Ur: NEGATIVE

## 2011-09-17 MED ORDER — KETOROLAC TROMETHAMINE 60 MG/2ML IM SOLN
60.0000 mg | Freq: Once | INTRAMUSCULAR | Status: AC
  Administered 2011-09-17: 60 mg via INTRAMUSCULAR
  Filled 2011-09-17: qty 2

## 2011-09-17 MED ORDER — CYCLOBENZAPRINE HCL 10 MG PO TABS
10.0000 mg | ORAL_TABLET | Freq: Once | ORAL | Status: AC
  Administered 2011-09-17: 10 mg via ORAL
  Filled 2011-09-17: qty 1

## 2011-09-17 MED ORDER — CYCLOBENZAPRINE HCL 10 MG PO TABS: 10.0000 mg | ORAL_TABLET | Freq: Two times a day (BID) | ORAL | Status: AC | PRN

## 2011-09-17 MED ORDER — CIPROFLOXACIN HCL 500 MG PO TABS: 500.0000 mg | ORAL_TABLET | Freq: Two times a day (BID) | ORAL | Status: AC

## 2011-09-17 MED ORDER — NAPROXEN 500 MG PO TABS: 500.0000 mg | ORAL_TABLET | Freq: Two times a day (BID) | ORAL | Status: AC

## 2011-09-17 NOTE — MAU Provider Note (Signed)
Attestation of Attending Supervision of Advanced Practitioner: Evaluation and management procedures were performed by the PA/NP/CNM/OB Fellow under my supervision/collaboration. Chart reviewed and agree with management and plan.  Kathleen Likins V 09/17/2011 9:30 PM

## 2011-09-17 NOTE — MAU Provider Note (Signed)
History     CSN: [id number]  Arrival date and time: 09/17/11 1430   First Provider Initiated Contact with Patient 09/17/11 1518      Chief Complaint  Patient presents with  . Chest Pain    left side rib pain  . Dysuria   HPI  Pt is not pregnant and presents with pain with urination for about 1 week and also left sided rib and breast pain after falling off a horse yesterday afternoon.  Pt also has a vaginal discharge that she thinks is a recurrence of BV.  Pt has had frequent ED visits with pain medication prescriptions and history of substance abuse.  Pt has a history of frequent UTIs.  She has had a BTL and also condoms with condom breakage recently.    Past Medical History  Diagnosis Date  . No pertinent past medical history   . Drug dependence   . Depression     Past Surgical History  Procedure Date  . Cesarean section   . Cholecystectomy     History reviewed. No pertinent family history.  History  Substance Use Topics  . Smoking status: Current Everyday Smoker    Types: Cigarettes  . Smokeless tobacco: Not on file  . Alcohol Use: No    Allergies:  Allergies  Allergen Reactions  . Penicillins Hives and Nausea And Vomiting    Prescriptions prior to admission  Medication Sig Dispense Refill  . acetaminophen (TYLENOL) 325 MG tablet Take 650 mg by mouth every 6 (six) hours as needed. pain      . ibuprofen (ADVIL,MOTRIN) 200 MG tablet Take 800 mg by mouth every 6 (six) hours as needed. Pain/inflamation        Review of Systems  Constitutional: Negative for fever and chills.  Cardiovascular: Negative for orthopnea.  Gastrointestinal: Positive for abdominal pain. Negative for diarrhea and constipation.  Genitourinary: Positive for dysuria.  Musculoskeletal:       Left sided rib pain with tenderness and left breast tenderness   Physical Exam   Blood pressure 127/88, pulse 92, temperature 97.4 F (36.3 C), temperature source Oral, resp. rate 16, height 5\' 5"   (1.651 m), weight 131 lb (59.421 kg), last menstrual period 09/11/2011.  Physical Exam  Vitals reviewed. Constitutional: She is oriented to person, place, and time. She appears well-developed and well-nourished.  HENT:  Head: Normocephalic.  Eyes: Pupils are equal, round, and reactive to light.  Neck: Normal range of motion. Neck supple.  Cardiovascular: Normal rate.   Respiratory: Effort normal and breath sounds normal. She has no wheezes. She has no rales. She exhibits tenderness.  GI: Soft. She exhibits no distension. There is tenderness. There is no rebound and no guarding.  Genitourinary:       Small amount of watery white discharge in vault; mucosa clean pale pink; uterus NSSC no CMT adnexal without palpable enlargement- mild right adnexal tenderness  Musculoskeletal: Normal range of motion.       Pt did NOT have difficulty moving in the bed to get in lithotomy position or pain with bed movement  Neurological: She is alert and oriented to person, place, and time.  Skin: Skin is warm and dry.  Psychiatric: Her behavior is normal.    MAU Course  Procedures Injury secondary to horse fall Recurrent UTI   Assessment and Plan  M-S injury secondary to horse fall-prescription for Flexeril 10mg  TID and NSAIDs Recurrent UTI- prophylaxis discussed- prescription for Cipro 500 mg BID for 5 days Need for  primary care provider  [facility] 09/17/2011, 3:32 PM

## 2011-09-17 NOTE — MAU Note (Signed)
Larey Seat off horse yesterday left side rib pain having dysuria for about one week, history of tubal ligation almost one year ago.

## 2011-09-18 LAB — GC/CHLAMYDIA PROBE AMP, GENITAL: Chlamydia, DNA Probe: NEGATIVE

## 2011-12-11 ENCOUNTER — Inpatient Hospital Stay (HOSPITAL_COMMUNITY)
Admission: AD | Admit: 2011-12-11 | Discharge: 2011-12-11 | Discharge status: Against Medical Advice | Payer: Self-pay | Source: Ambulatory Visit | Attending: Obstetrics & Gynecology | Admitting: Obstetrics & Gynecology

## 2011-12-11 DIAGNOSIS — R109 Unspecified abdominal pain: Secondary | ICD-10-CM | POA: Insufficient documentation

## 2011-12-11 DIAGNOSIS — L293 Anogenital pruritus, unspecified: Secondary | ICD-10-CM | POA: Insufficient documentation

## 2011-12-11 DIAGNOSIS — R300 Dysuria: Secondary | ICD-10-CM | POA: Insufficient documentation

## 2011-12-11 LAB — URINALYSIS, ROUTINE W REFLEX MICROSCOPIC
Glucose, UA: NEGATIVE mg/dL
Leukocytes, UA: NEGATIVE
Nitrite: NEGATIVE
Protein, ur: NEGATIVE mg/dL

## 2011-12-11 NOTE — MAU Note (Signed)
Pt started yesterday with thick, white discharge, labial itching and low abd pain as well as as pain upon urination.

## 2011-12-11 NOTE — MAU Note (Signed)
Pt originally signed in at 1845 but then left before triage and returned at 2000

## 2013-07-01 ENCOUNTER — Emergency Department (HOSPITAL_COMMUNITY)
Admission: EM | Admit: 2013-07-01 | Discharge: 2013-07-01 | Disposition: A | Discharge status: Home / Self Care | Payer: Self-pay | Attending: Emergency Medicine | Admitting: Emergency Medicine

## 2013-07-01 ENCOUNTER — Encounter (HOSPITAL_COMMUNITY): Payer: Self-pay | Admitting: *Deleted

## 2013-07-01 ENCOUNTER — Encounter (HOSPITAL_COMMUNITY): Payer: Self-pay | Admitting: Emergency Medicine

## 2013-07-01 ENCOUNTER — Ambulatory Visit (HOSPITAL_COMMUNITY)
Admission: RE | Admit: 2013-07-01 | Discharge: 2013-07-01 | Disposition: A | Discharge status: Home / Self Care | Payer: Self-pay | Source: Intra-hospital | Attending: Psychiatry | Admitting: Psychiatry

## 2013-07-01 DIAGNOSIS — Z3202 Encounter for pregnancy test, result negative: Secondary | ICD-10-CM | POA: Insufficient documentation

## 2013-07-01 DIAGNOSIS — F1110 Opioid abuse, uncomplicated: Secondary | ICD-10-CM | POA: Insufficient documentation

## 2013-07-01 DIAGNOSIS — Z880 Allergy status to penicillin: Secondary | ICD-10-CM | POA: Insufficient documentation

## 2013-07-01 DIAGNOSIS — Z87440 Personal history of urinary (tract) infections: Secondary | ICD-10-CM | POA: Insufficient documentation

## 2013-07-01 DIAGNOSIS — J45909 Unspecified asthma, uncomplicated: Secondary | ICD-10-CM | POA: Insufficient documentation

## 2013-07-01 DIAGNOSIS — F1910 Other psychoactive substance abuse, uncomplicated: Secondary | ICD-10-CM

## 2013-07-01 DIAGNOSIS — F1120 Opioid dependence, uncomplicated: Secondary | ICD-10-CM

## 2013-07-01 DIAGNOSIS — F17200 Nicotine dependence, unspecified, uncomplicated: Secondary | ICD-10-CM | POA: Insufficient documentation

## 2013-07-01 LAB — RAPID URINE DRUG SCREEN, HOSP PERFORMED
Amphetamines: NOT DETECTED
Barbiturates: NOT DETECTED
Benzodiazepines: POSITIVE — AB
Cocaine: NOT DETECTED
Opiates: POSITIVE — AB
Tetrahydrocannabinol: POSITIVE — AB

## 2013-07-01 LAB — ACETAMINOPHEN LEVEL
ACETAMINOPHEN (TYLENOL), SERUM: 27.1 ug/mL (ref 10–30)
Acetaminophen (Tylenol), Serum: 34.9 ug/mL — ABNORMAL HIGH (ref 10–30)

## 2013-07-01 LAB — COMPREHENSIVE METABOLIC PANEL
ALT: 8 U/L (ref 0–35)
AST: 16 U/L (ref 0–37)
Albumin: 4.2 g/dL (ref 3.5–5.2)
Alkaline Phosphatase: 47 U/L (ref 39–117)
BILIRUBIN TOTAL: 0.3 mg/dL (ref 0.3–1.2)
BUN: 8 mg/dL (ref 6–23)
CALCIUM: 9.7 mg/dL (ref 8.4–10.5)
CHLORIDE: 98 meq/L (ref 96–112)
CO2: 26 meq/L (ref 19–32)
CREATININE: 0.67 mg/dL (ref 0.50–1.10)
Glucose, Bld: 98 mg/dL (ref 70–99)
Potassium: 4.1 mEq/L (ref 3.7–5.3)
Sodium: 136 mEq/L — ABNORMAL LOW (ref 137–147)
Total Protein: 7.1 g/dL (ref 6.0–8.3)

## 2013-07-01 LAB — CBC
HEMATOCRIT: 41 % (ref 36.0–46.0)
Hemoglobin: 14.2 g/dL (ref 12.0–15.0)
MCH: 33 pg (ref 26.0–34.0)
MCHC: 34.6 g/dL (ref 30.0–36.0)
MCV: 95.3 fL (ref 78.0–100.0)
Platelets: 290 10*3/uL (ref 150–400)
RBC: 4.3 MIL/uL (ref 3.87–5.11)
RDW: 13.1 % (ref 11.5–15.5)
WBC: 9 10*3/uL (ref 4.0–10.5)

## 2013-07-01 LAB — POCT PREGNANCY, URINE: PREG TEST UR: NEGATIVE

## 2013-07-01 LAB — SALICYLATE LEVEL

## 2013-07-01 LAB — ETHANOL

## 2013-07-01 MED ORDER — ONDANSETRON HCL 4 MG PO TABS: 4.0000 mg | ORAL_TABLET | Freq: Four times a day (QID) | ORAL | Status: DC

## 2013-07-01 MED ORDER — LOPERAMIDE HCL 2 MG PO CAPS: 2.0000 mg | ORAL_CAPSULE | Freq: Four times a day (QID) | ORAL | Status: DC | PRN

## 2013-07-01 MED ORDER — NAPROXEN 500 MG PO TABS: 500.0000 mg | ORAL_TABLET | Freq: Two times a day (BID) | ORAL | Status: DC

## 2013-07-01 MED ORDER — DICYCLOMINE HCL 20 MG PO TABS: 20.0000 mg | ORAL_TABLET | Freq: Two times a day (BID) | ORAL | Status: DC

## 2013-07-01 NOTE — ED Notes (Signed)
Pt here for medical clearance. She's requesting help with substance abuse, opiates and marijuana. Pt is also asking for STD testing
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
## 2013-07-01 NOTE — ED Notes (Addendum)
Patient's belongings: Manson PasseyBrown hoodie/coat White short sleeved jacket Grey/Multi colored leggings Green halter top Pink socks Blue bra White bikini Blue jeans White belt Cell phone Cell phone charger Ipad Black/Multi colored bathing suit [employer] shoes Lavender bra Spoon White purse White and gold purse Gold purse Pink bra Black lingerie Underwear x 5 Cosmetics bag with tampons, make up inside [employer] Black boots

## 2013-07-01 NOTE — ED Notes (Signed)
VS updated Patient denies complaints or needs at this time Call bell in reach

## 2013-07-01 NOTE — Discharge Instructions (Signed)
.I have given you medications to detox at home. Withdraw from opiates will not kill you, it is however very uncomfortable. The medications help with nausea, diarrhea, pain, and abdominal cramping.  Use the resource guide below for outpatient resources. Return if you are unable to hold down fluids.  Narcotic Withdrawal When drug use interferes with normal living activities and relationships, it is abuse. Abuse includes problems with family and friends. Psychological dependence has developed when your mind tells you that the drug is needed. This is usually followed by a physical dependence in which you need more of the drug to get the same feeling or "high." This is known as addiction or chemical dependency. Risk is greater when chemical dependency exists in the family. SYMPTOMS  When tolerance to narcotics has developed, stopping of the narcotic suddenly can cause uncomfortable physical symptoms. Most of the time these are mild and consist of shakes or jitters (tremors) in the hands,a rapid heart rate, rapid breathing, and temperature. Sometimes these symptoms are associated with anxiety, panic attacks, and bad dreams. Other symptoms include:  Irritability.  Anxiety.  Runny nose.  "Goose flesh."  Diarrhea.  Feeling sick to the stomach (nauseous).  Muscle spasms.  Sleeplessness.  Chills.  Sweats.  Drug cravings.  Confusion. The severity of the withdrawal is based on the individual and varies from person to person. Many people choose to continue using narcotics to get rid of the discomfort of withdrawal. They also use to try to feel normal. TREATMENT  Quitting an addiction means stopping use of all chemicals. This is hard but may save your life. With continual drug use, possible outcomes are often loss of self respect and esteem, violence, death, and eventually prison if the use of narcotics has led to the death of another. Addiction cannot be cured, but it can be stopped. This often  requires outside help and the care of professionals. Most hospitals and clinics can refer you to a specialized care center. It is not necessary for you to go through the uncomfortable symptoms of withdrawal. Your caregiver can provide you with medications that will help you through this difficult period. Try to avoid situations, friends, or alcohol, which may have made it possible for you to continue using narcotics in the past. Learn how to say no! HOME CARE INSTRUCTIONS   Drink fluids, get plenty of rest, and take hot baths.  Medicines may be prescribed to help control withdrawal symptoms.  Over-the-counter medicines may be helpful to control diarrhea or an upset stomach.  If your problems resulted from taking prescription pain medicines, make sure you have a follow-up visit with your caregiver within the next few days. Be open about this problem.  If you are dependent or addicted to street drugs, contact a local drug and alcohol treatment center or Narcotics Anonymous.  Have someone with you to monitor your symptoms.  Engage in healthy activities with friends who do not use drugs.  Stay away from the drug scene. It takes a long period of time to overcome addictions to all drugs. There may be times when you feel as though you want to use. Following loss of a physical addiction and going through withdrawal, you have conquered the most difficult part of getting rid of an addiction. Gradually, you will have a lessening of the craving that is telling you that you need narcotics to feel normal. Call your caregiver or a member of your support group if more support is needed. Learn who to talk to  in your family and among your friends so that during these periods you can receive outside help. SEEK IMMEDIATE MEDICAL CARE IF:   You have vomiting that cannot be controlled, especially if you cannot keep liquids down.  You are seeing things or hearing voices that are not really there  (hallucinating).  You have a seizure. Document Released: 08/19/2002 Document Revised: 08/21/2011 Document Reviewed: 05/24/2008 [facility] Patient Information 2014 [place], [place].  Opiate Dependence The above names are all different names used for opiates. Opiates are any medication made from the poppy plant. It is a medication which produces a calming, sleepy effect. Because achieving this effect requires more and more of this drug to get the same result, opiates become addictive. A family history of addiction or an addictive personality increases the risk. When drug use is interfering with normal living activities, it has become abuse. This includes problems with family, friends, and your job. Psychological dependence has developed when your mind tells you that the drug is needed. This emotional dependence is the craving for the "high" that some drugs cause. Emotional addicts always want this high instead of the way they are feeling when not using the drug. This is difficult to overcome. This is usually followed by physical dependence that has developed when continuing increases of drug are required to get the same feeling or "high". This is known as addiction or chemical dependency.  SIGNS OF CHEMICAL DEPENDENCY  Friends and family tell you there is a problem.  Fighting when using drugs.  Mood swings and insomnia.  Forgetfulness.  Not remembering what you do while using (blackouts).  Feeling sick from using drugs but you continue using.  Lie about use or amounts of drugs (chemicals) used.  Need chemicals to get you going.  Suffer in work International aid/development worker or school because of drug use.  Need drugs to relate to people or feel comfortable in social situations.  Use drugs to forget problems.  Difficulty with attention.  Neglecting obligations. If you answered "yes" to any or some of the above signs of chemical dependency, you may have a problem. The longer the use of drugs continues, the  greater the problems will become. Do not experiment with drugs.  SIGNS AND SYMPTOMS OF PHYSICAL DEPENDENCE  Sudden stopping of the narcotic is uncomfortable when tolerance has developed. Physical problems will develop. This is called withdrawal.  How bad the withdrawal is varies from person to person. Some of the smaller problems are:  Tremors in the hands or shakes and jitters.  A fast heart rate and rapid breathing.  An increase in temperature.  Anxiety and panic attacks with bad dreams.  Muscle aches and pains. You may have more serious problems. These can include:  Feeling sick to your stomach or throwing up.  Dehydration develops if you cannot keep fluids down.  Tremors and chills or fever with sweating and anxiety.  Hallucinations and cravings.  Body aching with restlessness and insomnia.  Seizures or convulsions. These problems can last for months. These uncomfortable feeling can cause you to use drugs again just to feel better. OTHER HEALTH RISKS OF NARCOTICS USE INCLUDE:  The increased possibility of getting AIDS, hepatitis, other infectious or sexually transmitted diseases.  Unplanned pregnancy and having a baby born addicted to narcotics. You then put your baby through painful withdrawal symptoms including: shaking, jerking, and crying in pain. Many babies die. Other babies have lifelong disabilities and learning problems. TREATMENT  Effective treatment and management of narcotic addiction requires a  multi-faceted, team approach that includes:  Medications to minimize the symptoms of narcotic withdrawal.  Medications to reduce the need for continued narcotic use.  Medical management of unrelated medical problems.  Pain management.  Social services.  Psychological treatment.  Behavioral therapies. Stopping your dependence is hard but may save your life. If you continue using drugs, the only possible outcome is loss of self respect and esteem, violence, and  eventually prison or death. To stop abuse, you must first realize you have a problem. You control your behavior. Once you realize this, commit to quitting. Addiction is a disease. You need medical help to get well. Your caregiver can counsel you or refer you for counseling. The best way to do this is to seek out an organization for help. These include Alcoholics Anonymous, Narcotics Anonymous, or the [employer] Council on Alcoholism and Drug Dependence. HOW TO STAY CLEAN WHEN YOU HAVE QUIT USING  Develop healthy activities.  Form friendships with those who do not use drugs.  Stay away from all drugs. Alcohol will lessen your ability to say no.  Have ready excuses available about why you cannot use. If that is difficult, stay away from people who knew you used. HOME CARE INSTRUCTIONS   Both prescription medicines and over-the-counter medicines are used as part of the treatment. It is critical to follow the recommendations of your caregiver at home. Any additional over-the-counter medications or changes in the recommended treatment plan should be discussed with your caregiver first.  It is important to keep fluids down. Juices, soda, Gatorade, or a mixture will help prevent dehydration.  Be prepared for the emotional swings of quitting.  Call your local emergency services if seizures (convulsions) occur or if you are unable to keep liquids down.  Keep a written record of medications you take and times given. Overcoming addictions takes years. Over time you will have a lessening of the craving for narcotics. Talk to your caregiver or a member of your support group if you need more help.  Addiction cannot be cured but it can be stopped. Treatment centers are listed in the yellow pages under: Cocaine, Narcotics, and Alcoholics Anonymous. Most hospitals and clinics can refer you to a specialized care center. Document Released: 03/26/2007 Document Revised: 08/21/2011 Document Reviewed:  03/26/2007 [facility] Patient Information 2014 [place].  Emergency Department Resource Guide 1) Find a Doctor and Pay Out of Pocket Although you won't have to find out who is covered by your insurance plan, it is a good idea to ask around and get recommendations. You will then need to call the office and see if the doctor you have chosen will accept you as a new patient and what types of options they offer for patients who are self-pay. Some doctors offer discounts or will set up payment plans for their patients who do not have insurance, but you will need to ask so you aren't surprised when you get to your appointment.  2) Contact Your Local Health Department Not all health departments have doctors that can see patients for sick visits, but many do, so it is worth a call to see if yours does. If you don't know where your local health department is, you can check in your phone book. The CDC also has a tool to help you locate your state's health department, and many state websites also have listings of all of their local health departments.  3) Find a Walk-in Clinic If your illness is not likely to be very severe  or complicated, you may want to try a walk in clinic. These are popping up all over the country in pharmacies, drugstores, and shopping centers. They're usually staffed by nurse practitioners or physician assistants that have been trained to treat common illnesses and complaints. They're usually fairly quick and inexpensive. However, if you have serious medical issues or chronic medical problems, these are probably not your best option.  No Primary Care Doctor: - Call Health Connect at  [phone] - they can help you locate a primary care doctor that  accepts your insurance, provides certain services, etc. - Physician Referral Service- [phone]  Chronic Pain Problems: Organization         Address  Phone   Notes  [facility]  [phone] Patients need to be  referred by their primary care doctor.   Medication Assistance: Organization         Address  Phone   Notes  [facility] Medication [facility] [address] [place] [place], [place] [address] [phone] --Must be a resident of [facility] -- Must have NO insurance coverage whatsoever (no Medicaid/ Medicare, etc.) -- The pt. MUST have a primary care doctor that directs their care regularly and follows them in the community   [facility]  [phone]   [employer]  [phone]    Agencies that provide inexpensive medical care: Organization         Address  Phone   Notes  [facility]  [phone]   [facility]    [phone]   [facility] [address] [place], [place] [address] [phone]   [facility] [address], [place] [phone]   Planned Parenthood    [phone]   [facility]    [phone]   [facility]  [address], [place] Phone:  [phone], Fax:  [phone] Hours of Operation:  9 am - 6 pm, M-F.  Also accepts Medicaid/Medicare and self-pay.  [facility]  [address], [place] Phone: [phone], Fax: [phone]. Hours of Operation:  8:30 am - 5:30 pm, M-F.  Also accepts Medicaid and self-pay.  [facility] [place] [address], [place] Phone: [phone]   [facility] [address] Natasha Bence [place], [place] [phone], Ext. [phone] Mondays & Thursdays: 7-9 AM.  First 15 patients are seen on a first come, first serve basis.    Medicaid-accepting [facility] Providers:  Organization         Address  Phone   Notes  [facility] [address], [place] [phone] Also accepts self-pay patients.  [facility] [address] Laurell Josephs [place], [place]  [phone]   [facility] [address], [place] [phone]   [facility] [address] [place] [phone]   Renaye Rakers [address], [place]   [phone] Only accepts [place] Access [place] patients after they have their name applied to their card.   Self-Pay (no insurance) in [facility]:  Organization         Address  Phone   Notes  Sickle Cell Patients, [facility] [address] [place], [place] [phone]   [facility] [address], [place] [phone]   [facility]  [address], [place] [phone]   [facility]/Dr. Osei-Bonsu  [address] [place] or [address], [place] [phone] Phone number for both [place] and [place] locations is the same.  [facility]gent Medical and [facility] [address] [place] [phone]   [facility] [address] [place] or [address] [phone]   [facility] [address] [place] [phone], phone; [phone], fax Sees patients 1st and 3rd Saturday of every month.  Must not qualify for public or private insurance (i.e. Medicaid, Medicare, [place] Health Choice, Veterans' Benefits)  Household income should be no more than 200% of the poverty level The clinic cannot treat you if you are pregnant or think you are pregnant  Sexually transmitted diseases are not treated at the clinic.    Dental Care: Organization         Address  Phone  Notes  [facility] [address] [place], [place] [phone] Accepts children up to age 84 who are enrolled in [place] or [place] Health Choice; pregnant women with a Medicaid card; and children who have applied for Medicaid or [place] Health Choice, but were declined, whose parents can  pay a reduced fee at time of service.  [facility]  [address], [place] [phone] Accepts children up to age 85 who are enrolled in [place] or [place] Health Choice; pregnant women with a Medicaid card; and children who have applied for Medicaid or [place] Health Choice, but were declined, whose parents can pay a reduced fee at time of service.  [facility]  [address] [place], [place] [phone] Patients are seen by appointment only. Walk-ins are not accepted. [facility] will see patients 44 years of age and older. Monday - Tuesday (8am-5pm) Most Wednesdays (8:30-5pm) $30 per visit, cash only  [facility] Adult Dental Access [facility]  [address], [facility] [phone] Patients are seen by appointment only. Walk-ins are not accepted. [facility] will see patients 9 years of age and older. One Wednesday Evening (Monthly: Volunteer Based).  $30 per visit, cash only  [employer]  [phone] for adults; Children under age 6, call Graduate Pediatric Dentistry at [phone]. Children aged 34-14, please call [phone] to request a pediatric application.  Dental services are provided in all areas of dental care including fillings, crowns and bridges, complete and partial dentures, implants, gum treatment, root canals, and extractions. Preventive care is also provided. Treatment is provided to both adults and children. Patients are selected via a lottery and there is often a waiting list.   [facility] [address] [place]  [phone] [email or web address]   [facility] [address] [place], [place] [phone], Ext. [phone] Second and Fourth Thursday of each month, opens at 6:30 AM; Clinic ends at 9 AM.  Patients are seen on a first-come first-served basis, and a limited number are seen during each clinic.   [facility]  [address] Ether Griffins [place], [place] [phone]   Eligibility Requirements You must have lived in [place], [place], or [place] counties for at least the last three months.   You cannot  be eligible for state or federal sponsored [place], including [employer], [place], or [employer].   You generally cannot be eligible for healthcare insurance through your employer.    How to apply: Eligibility screenings are held every Tuesday and Wednesday afternoon from 1:00 pm until 4:00 pm. You do not need an appointment for the interview!  [facility] [address], [place], [place] [phone]   [facility]  [phone]   [facility] Health Department  [phone]   [facility] Health Department  [phone]    Behavioral Health Resources in the Community: Intensive Outpatient Programs Organization         Address  Phone  Notes  [facility] Services [address] [place], [place] [phone]   [facility] Outpatient [address] [place], [place] [phone]   [facility] [address], [place], [place]  [phone]   [facility] Mental Health [address],  [place], [place] [phone] or [phone]   Substance Abuse Resources Organization         Address  Phone  Notes  [facility]  [phone]   [facility]  [phone]   [facility]  [phone]   Floydene Flock  [phone]   Residential & Outpatient Substance Abuse Program  [phone]   Psychological Services Organization         Address  Phone  Notes  [facility] Behavioral Health  [phone]   [facility] Services  [phone]   [facility] Mental Health [address], [place] [phone] or [phone]    Mobile Crisis Teams Organization         Address  Phone  Notes  [facility], Mobile Crisis Care Unit  [phone]    [facility]  [address] [place], [place] [phone]   Doristine Locks [address] [place] [phone]    Self-Help/Support Groups Organization         Address  Phone             Notes  [facility] [place] - variety of support groups  [phone]- [phone] Call for more information  Narcotics Anonymous (NA), [facility] [address], [employer] [place]  2 meetings at this location   Statistician         Address  Phone  Notes  [facility] [address],    [place]  [phone]   [facility]  [address] [place] [phone], [place], [place] [phone]   [facility] [address] [place], [place] [phone] Admissions: 8am-3pm M-F  [facility] [address],    [place], [place] [phone]   [facility] [address] [place], [place], [place] [phone]   [facility] [address],  [place], [place] [phone]   [facility] - Intensive Outpatient [address] Laurell Josephs [address], [place], [place] [phone]   [facility] ([facility] [address] [place]  [place], [place] [phone] or [phone]   [facility] ([facility]) [address], [place], [place] [phone] Accepts Medicaid  [facility] [address],  [place] [phone] Substance Abuse/Addiction Treatment   [facility] Organization         Address  Phone  Notes  [facility]  [phone]   Angie Fava, PhD [phone] [address]  [place], [place]   [phone] or [phone]   [facility]   [address] [place], [place] [phone]   [facility] [address] [place], [place] [phone] Insurance/Medicaid/sponsorship through  [facility] [address]                                     [place], [place] [phone] Therapy/tele-psych/case  [facility] [address] [place], [place] [phone]    Dr. Lolly Mustache  [phone]   [facility]  [employer] [facility] Dept. 1) [address], [place] 2) [address] [place] 3)  [address], [place] [phone]  [phone]   [facility] Child Abuse Hotline [phone]) [phone] or [phone] (After Hours)

## 2013-07-01 NOTE — ED Notes (Signed)
PA at bedside to assess patient As previously noted, patient informed this nurse that she would like "to be checked for any STDs." PA is aware Per PA, pelvic exam to be postponed at this time due to patient being intoxicated

## 2013-07-01 NOTE — ED Notes (Signed)
Patient has three bags of belongings in locker 32. 

## 2013-07-01 NOTE — ED Provider Notes (Signed)
Medical screening examination/treatment/procedure(s) were performed by non-physician practitioner and as supervising physician I was immediately available for consultation/collaboration.  EKG Interpretation   None        Derwood KaplanAnkit Tana Trefry, MD 07/01/13 [phone]

## 2013-07-01 NOTE — ED Provider Notes (Signed)
7:17 AM BP 93/64  Pulse 62  Temp(Src) 98 F (36.7 C) (Oral)  Resp 20  Ht 5\' 6"  (1.676 m)  Wt 130 lb (58.968 kg)  BMI 20.99 kg/m2  SpO2 94%  LMP 06/16/2013  Patient assumed in care hand off from PA browning at 6:00AM/ Seeking help for opiate dependence/ poly substance abuse. Elevated tylenol level, no trending down. Patient appears safe for psych eval.  Patient is not suicidal/ not homicidal. No AVH. Patient will be given Resource guide and discharged with Bentyl and Zofran, for home detox.   Arthor CaptainAbigail Mekiyah Gladwell, PA-C 07/01/13 [phone]
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
## 2013-07-01 NOTE — ED Provider Notes (Signed)
Medical screening examination/treatment/procedure(s) were performed by non-physician practitioner and as supervising physician I was immediately available for consultation/collaboration.  EKG Interpretation   None        Sante Biedermann, MD 07/01/13 0814 

## 2013-07-01 NOTE — ED Notes (Signed)
Patient resting in position of comfort with eyes closed RR WNL--even and unlabored with equal rise and fall of chest Patient in NAD Side rails up, call bell in reach  

## 2013-09-15 ENCOUNTER — Emergency Department (HOSPITAL_COMMUNITY)
Admission: EM | Admit: 2013-09-15 | Discharge: 2013-09-15 | Disposition: A | Discharge status: Home / Self Care | Payer: Self-pay | Attending: Emergency Medicine | Admitting: Emergency Medicine

## 2013-09-15 ENCOUNTER — Encounter (HOSPITAL_COMMUNITY): Payer: Self-pay | Admitting: Emergency Medicine

## 2013-09-15 ENCOUNTER — Emergency Department (HOSPITAL_COMMUNITY): Payer: Self-pay

## 2013-09-15 DIAGNOSIS — R7401 Elevation of levels of liver transaminase levels: Secondary | ICD-10-CM | POA: Insufficient documentation

## 2013-09-15 DIAGNOSIS — R7309 Other abnormal glucose: Secondary | ICD-10-CM | POA: Insufficient documentation

## 2013-09-15 DIAGNOSIS — R7402 Elevation of levels of lactic acid dehydrogenase (LDH): Secondary | ICD-10-CM | POA: Insufficient documentation

## 2013-09-15 DIAGNOSIS — Y9389 Activity, other specified: Secondary | ICD-10-CM | POA: Insufficient documentation

## 2013-09-15 DIAGNOSIS — R739 Hyperglycemia, unspecified: Secondary | ICD-10-CM

## 2013-09-15 DIAGNOSIS — Z87440 Personal history of urinary (tract) infections: Secondary | ICD-10-CM | POA: Insufficient documentation

## 2013-09-15 DIAGNOSIS — T424X4A Poisoning by benzodiazepines, undetermined, initial encounter: Secondary | ICD-10-CM | POA: Insufficient documentation

## 2013-09-15 DIAGNOSIS — T400X1A Poisoning by opium, accidental (unintentional), initial encounter: Secondary | ICD-10-CM | POA: Insufficient documentation

## 2013-09-15 DIAGNOSIS — T424X1A Poisoning by benzodiazepines, accidental (unintentional), initial encounter: Secondary | ICD-10-CM | POA: Insufficient documentation

## 2013-09-15 DIAGNOSIS — Z87828 Personal history of other (healed) physical injury and trauma: Secondary | ICD-10-CM | POA: Insufficient documentation

## 2013-09-15 DIAGNOSIS — E860 Dehydration: Secondary | ICD-10-CM | POA: Insufficient documentation

## 2013-09-15 DIAGNOSIS — F1010 Alcohol abuse, uncomplicated: Secondary | ICD-10-CM | POA: Insufficient documentation

## 2013-09-15 DIAGNOSIS — T40901A Poisoning by unspecified psychodysleptics [hallucinogens], accidental (unintentional), initial encounter: Secondary | ICD-10-CM | POA: Insufficient documentation

## 2013-09-15 DIAGNOSIS — Z880 Allergy status to penicillin: Secondary | ICD-10-CM | POA: Insufficient documentation

## 2013-09-15 DIAGNOSIS — Y9289 Other specified places as the place of occurrence of the external cause: Secondary | ICD-10-CM | POA: Insufficient documentation

## 2013-09-15 DIAGNOSIS — R079 Chest pain, unspecified: Secondary | ICD-10-CM | POA: Insufficient documentation

## 2013-09-15 DIAGNOSIS — Z3202 Encounter for pregnancy test, result negative: Secondary | ICD-10-CM | POA: Insufficient documentation

## 2013-09-15 DIAGNOSIS — T40904A Poisoning by unspecified psychodysleptics [hallucinogens], undetermined, initial encounter: Secondary | ICD-10-CM | POA: Insufficient documentation

## 2013-09-15 DIAGNOSIS — F1920 Other psychoactive substance dependence, uncomplicated: Secondary | ICD-10-CM | POA: Insufficient documentation

## 2013-09-15 DIAGNOSIS — T50901A Poisoning by unspecified drugs, medicaments and biological substances, accidental (unintentional), initial encounter: Secondary | ICD-10-CM

## 2013-09-15 DIAGNOSIS — R740 Nonspecific elevation of levels of transaminase and lactic acid dehydrogenase [LDH]: Secondary | ICD-10-CM

## 2013-09-15 DIAGNOSIS — J45901 Unspecified asthma with (acute) exacerbation: Secondary | ICD-10-CM | POA: Insufficient documentation

## 2013-09-15 LAB — URINALYSIS, ROUTINE W REFLEX MICROSCOPIC
Bilirubin Urine: NEGATIVE
GLUCOSE, UA: 500 mg/dL — AB
HGB URINE DIPSTICK: NEGATIVE
Ketones, ur: NEGATIVE mg/dL
Leukocytes, UA: NEGATIVE
Nitrite: NEGATIVE
Protein, ur: 30 mg/dL — AB
SPECIFIC GRAVITY, URINE: 1.037 — AB (ref 1.005–1.030)
UROBILINOGEN UA: 0.2 mg/dL (ref 0.0–1.0)
pH: 5.5 (ref 5.0–8.0)

## 2013-09-15 LAB — URINE MICROSCOPIC-ADD ON

## 2013-09-15 LAB — RAPID URINE DRUG SCREEN, HOSP PERFORMED
Amphetamines: NOT DETECTED
BENZODIAZEPINES: POSITIVE — AB
Barbiturates: NOT DETECTED
COCAINE: NOT DETECTED
Opiates: POSITIVE — AB
Tetrahydrocannabinol: POSITIVE — AB

## 2013-09-15 LAB — CBC
HEMATOCRIT: 39.1 % (ref 36.0–46.0)
Hemoglobin: 13.6 g/dL (ref 12.0–15.0)
MCH: 32.9 pg (ref 26.0–34.0)
MCHC: 34.8 g/dL (ref 30.0–36.0)
MCV: 94.4 fL (ref 78.0–100.0)
PLATELETS: 267 10*3/uL (ref 150–400)
RBC: 4.14 MIL/uL (ref 3.87–5.11)
RDW: 12.7 % (ref 11.5–15.5)
WBC: 16 10*3/uL — ABNORMAL HIGH (ref 4.0–10.5)

## 2013-09-15 LAB — COMPREHENSIVE METABOLIC PANEL
ALBUMIN: 4.4 g/dL (ref 3.5–5.2)
ALT: 39 U/L — ABNORMAL HIGH (ref 0–35)
AST: 79 U/L — AB (ref 0–37)
Alkaline Phosphatase: 58 U/L (ref 39–117)
BUN: 15 mg/dL (ref 6–23)
CO2: 24 mEq/L (ref 19–32)
CREATININE: 0.77 mg/dL (ref 0.50–1.10)
Calcium: 9.9 mg/dL (ref 8.4–10.5)
Chloride: 100 mEq/L (ref 96–112)
GFR calc Af Amer: >90 mL/min (ref >90)
GFR calc non Af Amer: >90 mL/min (ref >90)
Glucose, Bld: 195 mg/dL — ABNORMAL HIGH (ref 70–99)
POTASSIUM: 4.2 meq/L (ref 3.7–5.3)
Sodium: 142 mEq/L (ref 137–147)
TOTAL PROTEIN: 7.8 g/dL (ref 6.0–8.3)
Total Bilirubin: 0.3 mg/dL (ref 0.3–1.2)

## 2013-09-15 LAB — CBG MONITORING, ED: Glucose-Capillary: 141 mg/dL — ABNORMAL HIGH (ref 70–99)

## 2013-09-15 LAB — LIPASE, BLOOD: LIPASE: 13 U/L (ref 11–59)

## 2013-09-15 LAB — ACETAMINOPHEN LEVEL

## 2013-09-15 LAB — SALICYLATE LEVEL

## 2013-09-15 LAB — ETHANOL

## 2013-09-15 LAB — POC URINE PREG, ED: Preg Test, Ur: NEGATIVE

## 2013-09-15 MED ORDER — SODIUM CHLORIDE 0.9 % IV BOLUS (SEPSIS)
1000.0000 mL | Freq: Once | INTRAVENOUS | Status: AC
  Administered 2013-09-15: 1000 mL via INTRAVENOUS

## 2013-09-15 NOTE — Discharge Instructions (Signed)
Overdose, Adult A person can overdose on alcohol, drugs or both by accident or on purpose. If it was on purpose, it is a serious matter. Professional help should be sought. If the overdose was an accident, certain steps should be taken to make sure that it never happens again. ACCIDENTAL OVERDOSE Overdosing on prescription medications can be a result of:  Not understanding the instructions.  Misreading the label.  Forgetting that you took a dose and then taking another by mistake. This situation happens a lot. To make sure this does not happen again:  Clarify the correct dosage with your caregiver.  Place the correct dosage in a "pill-minder" container (labeled for each day and time of day).  Have someone dispense your medicine. Please be sure to follow-up with your primary care doctor as directed. INTENTIONAL OVERDOSE If the overdose was on purpose, it is a serious situation. Taking more than the prescribed amount of medications (including taking someone else's prescription), abusing street drugs or drinking an amount of alcohol that requires medical treatment can show a variety of possible problems. These may indicate you:  Are depressed or suicidal.  Are abusing drugs, took too much or combined different drugs to experiment with the effects.  Mixed alcohol with drugs and did not realize the danger of doing so (this is drug abuse).  Are suffering addiction to drugs and/or alcohol (also known as chemical dependency).  Binge drink. If you have not been referred to a mental health professional for help, it is important that you get help right away. Only a professional can determine which problems may exist and what the best course of treatment may be. It is your responsibility to follow-up with further evaluation or treatment as directed.  Alcohol is responsible for a large number of overdoses and unintended deaths among college-age young adults. Binge drinking is consuming 4-5 drinks  in a short period of time. The amount of alcohol in standard servings of wine (5 oz.), beer (12 oz.) and distilled spirits (1.5 oz., 80 proof) is the same. Beer or wine can be just as dangerous to the binge drinker as "hard" liquor can be.  CONSEQUENCES OF BINGE DRINKING Alcohol poisoning is the most serious consequence of binge drinking. This is a severe and potentially fatal physical reaction to an alcohol overdose. When too much alcohol is consumed, the brain does not get enough oxygen. The lack of oxygen will eventually cause the brain to shut down the voluntary functions that regulate breathing and heart rate. Symptoms of alcohol poisoning include:  Vomiting.  Passing out (unconsciousness).  Cold, clammy, pale or bluish skin.  Slow or irregular breathing. WHAT SHOULD I DO NEXT? If you have a history of drug abuse or suffer chemical dependency (alcoholism, drug addiction or both), you might consider the following:  Talk with a qualified substance abuse counselor and consider entering a treatment program.  Go to a detox facility if necessary.  If you were attending self-help group meetings, consider returning to them and go often.  Explore other resources located near you (see sources listed below). If you are unsure if you have a substance abuse problem, ask yourself the following questions:  Have you been told by friends or family that drugs/alcohol has become a problem?  Do you get into fights when drinking or using drugs?  Do you have blackouts (not remembering what you do while using)?  Do you lie about use or amounts of drugs or alcohol you consume?  Do you need  chemicals to get you going?  Do you suffer in work or school performance because of drug or alcohol use?  Do you get sick from drug or alcohol use but continue to use anyway?  Do you need drugs or alcohol to relate to people or feel comfortable in social situations?  Do you use drugs or alcohol to forget  problems? If you answered "Yes" to any of the above questions, it means you show signs of chemical dependency and a professional evaluation is suggested. The longer the use of drugs and alcohol continues, the problems will become greater. SEEK IMMEDIATE MEDICAL CARE IF:   You feel like you might repeat your problematic behavior.  You need someone to talk to and feel that it should not wait.  You feel you are a danger to yourself or someone else.  You feel like you are having a new reaction to medications you are taking, or you are getting worse after leaving a care center.  You have an overwhelming urge to drink or use drugs. Addiction cannot be cured, but it can be treated successfully. Treatment centers are listed in the yellow pages under: Cocaine, Narcotics, and Alcoholics Anonymous. Most hospitals and clinics can refer you to a specialized care center. The [place] government maintains a toll-free number for obtaining treatment referrals: [phone] or 778-872-13961-804-290-7867 (TDD) and maintains a website: http://findtreatment.RockToxic.plsamhsa.gov. Other websites for additional information are: www.mentalhealth.RockToxic.plsamhsa.gov. and GreatestFeeling.tnwww.nida.gov. In [place] treatment resources are listed in each [place] with listings available under [employer] or similar titles. Document Released: 06/01/2003 Document Revised: 08/21/2011 Document Reviewed: 04/22/2008 [facility] Patient Information 2014 [place], [place].  Overdose, Adult A person can overdose on alcohol, drugs or both by accident or on purpose. If it was on purpose, it is a serious matter. Professional help should be sought. If the overdose was an accident, certain steps should be taken to make sure that it never happens again. ACCIDENTAL OVERDOSE Overdosing on prescription medications can be a result of:  Not understanding the instructions.  Misreading the label.  Forgetting that you took a dose and then taking another by mistake. This  situation happens a lot. To make sure this does not happen again:  Clarify the correct dosage with your caregiver.  Place the correct dosage in a "pill-minder" container (labeled for each day and time of day).  Have someone dispense your medicine. Please be sure to follow-up with your primary care doctor as directed. INTENTIONAL OVERDOSE If the overdose was on purpose, it is a serious situation. Taking more than the prescribed amount of medications (including taking someone else's prescription), abusing street drugs or drinking an amount of alcohol that requires medical treatment can show a variety of possible problems. These may indicate you:  Are depressed or suicidal.  Are abusing drugs, took too much or combined different drugs to experiment with the effects.  Mixed alcohol with drugs and did not realize the danger of doing so (this is drug abuse).  Are suffering addiction to drugs and/or alcohol (also known as chemical dependency).  Binge drink. If you have not been referred to a mental health professional for help, it is important that you get help right away. Only a professional can determine which problems may exist and what the best course of treatment may be. It is your responsibility to follow-up with further evaluation or treatment as directed.  Alcohol is responsible for a large number of overdoses and unintended deaths among college-age young adults. Binge drinking is consuming  4-5 drinks in a short period of time. The amount of alcohol in standard servings of wine (5 oz.), beer (12 oz.) and distilled spirits (1.5 oz., 80 proof) is the same. Beer or wine can be just as dangerous to the binge drinker as "hard" liquor can be.  CONSEQUENCES OF BINGE DRINKING Alcohol poisoning is the most serious consequence of binge drinking. This is a severe and potentially fatal physical reaction to an alcohol overdose. When too much alcohol is consumed, the brain does not get enough oxygen. The  lack of oxygen will eventually cause the brain to shut down the voluntary functions that regulate breathing and heart rate. Symptoms of alcohol poisoning include:  Vomiting.  Passing out (unconsciousness).  Cold, clammy, pale or bluish skin.  Slow or irregular breathing. WHAT SHOULD I DO NEXT? If you have a history of drug abuse or suffer chemical dependency (alcoholism, drug addiction or both), you might consider the following:  Talk with a qualified substance abuse counselor and consider entering a treatment program.  Go to a detox facility if necessary.  If you were attending self-help group meetings, consider returning to them and go often.  Explore other resources located near you (see sources listed below). If you are unsure if you have a substance abuse problem, ask yourself the following questions:  Have you been told by friends or family that drugs/alcohol has become a problem?  Do you get into fights when drinking or using drugs?  Do you have blackouts (not remembering what you do while using)?  Do you lie about use or amounts of drugs or alcohol you consume?  Do you need chemicals to get you going?  Do you suffer in work or school performance because of drug or alcohol use?  Do you get sick from drug or alcohol use but continue to use anyway?  Do you need drugs or alcohol to relate to people or feel comfortable in social situations?  Do you use drugs or alcohol to forget problems? If you answered "Yes" to any of the above questions, it means you show signs of chemical dependency and a professional evaluation is suggested. The longer the use of drugs and alcohol continues, the problems will become greater. SEEK IMMEDIATE MEDICAL CARE IF:   You feel like you might repeat your problematic behavior.  You need someone to talk to and feel that it should not wait.  You feel you are a danger to yourself or someone else.  You feel like you are having a new reaction to  medications you are taking, or you are getting worse after leaving a care center.  You have an overwhelming urge to drink or use drugs. Addiction cannot be cured, but it can be treated successfully. Treatment centers are listed in the yellow pages under: Cocaine, Narcotics, and Alcoholics Anonymous. Most hospitals and clinics can refer you to a specialized care center. The [place] government maintains a toll-free number for obtaining treatment referrals: (867)876-4301 or 785-618-3299 (TDD) and maintains a website: http://findtreatment.RockToxic.pl. Other websites for additional information are: www.mentalhealth.RockToxic.pl. and GreatestFeeling.tn. In [place] treatment resources are listed in each [place] with listings available under [employer] or similar titles. Document Released: 06/01/2003 Document Revised: 08/21/2011 Document Reviewed: 04/22/2008 [facility] Patient Information 2014 [place], [place].
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
## 2013-09-15 NOTE — ED Notes (Addendum)
Pt admits to heroin IV and unknown amount xanax by mouth

## 2013-09-15 NOTE — ED Notes (Signed)
POCT PREG resulted Neg.

## 2013-09-15 NOTE — Progress Notes (Signed)
[facility] provided pt with a [facility] Orange Card application, highlighting [facility], to help patient establish primary care.  °
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
## 2013-09-17 NOTE — ED Provider Notes (Signed)
Shared service with midlevel provider. I have personally seen and examined the patient, providing direct face to face care, presenting with the chief complaint of overdose. Physical exam findings include aox3 patient, with no neuro deficits, no resp deficits. Plan will be to discharge her when she can ambulate well. No SI/HI. I have reviewed the nursing documentation on past medical history, family history, and social history.   Derwood KaplanAnkit Lorrin Bodner, MD 09/17/13 [phone]

## 2013-09-27 ENCOUNTER — Emergency Department (HOSPITAL_COMMUNITY)
Admission: EM | Admit: 2013-09-27 | Discharge: 2013-09-27 | Disposition: A | Discharge status: Home / Self Care | Payer: Self-pay | Attending: Emergency Medicine | Admitting: Emergency Medicine

## 2013-09-27 ENCOUNTER — Encounter (HOSPITAL_COMMUNITY): Payer: Self-pay | Admitting: Emergency Medicine

## 2013-09-27 ENCOUNTER — Emergency Department (HOSPITAL_COMMUNITY): Payer: Self-pay

## 2013-09-27 DIAGNOSIS — R079 Chest pain, unspecified: Secondary | ICD-10-CM | POA: Insufficient documentation

## 2013-09-27 DIAGNOSIS — M25539 Pain in unspecified wrist: Secondary | ICD-10-CM | POA: Insufficient documentation

## 2013-09-27 DIAGNOSIS — F329 Major depressive disorder, single episode, unspecified: Secondary | ICD-10-CM | POA: Insufficient documentation

## 2013-09-27 DIAGNOSIS — F3289 Other specified depressive episodes: Secondary | ICD-10-CM | POA: Insufficient documentation

## 2013-09-27 DIAGNOSIS — F1120 Opioid dependence, uncomplicated: Secondary | ICD-10-CM | POA: Insufficient documentation

## 2013-09-27 DIAGNOSIS — F17200 Nicotine dependence, unspecified, uncomplicated: Secondary | ICD-10-CM | POA: Insufficient documentation

## 2013-09-27 DIAGNOSIS — M79609 Pain in unspecified limb: Secondary | ICD-10-CM

## 2013-09-27 DIAGNOSIS — Z87440 Personal history of urinary (tract) infections: Secondary | ICD-10-CM | POA: Insufficient documentation

## 2013-09-27 DIAGNOSIS — J45909 Unspecified asthma, uncomplicated: Secondary | ICD-10-CM | POA: Insufficient documentation

## 2013-09-27 LAB — CBC
HCT: 43.6 % (ref 36.0–46.0)
Hemoglobin: 15.5 g/dL — ABNORMAL HIGH (ref 12.0–15.0)
MCH: 33.3 pg (ref 26.0–34.0)
MCHC: 35.6 g/dL (ref 30.0–36.0)
MCV: 93.8 fL (ref 78.0–100.0)
PLATELETS: 315 10*3/uL (ref 150–400)
RBC: 4.65 MIL/uL (ref 3.87–5.11)
RDW: 12.9 % (ref 11.5–15.5)
WBC: 9.1 10*3/uL (ref 4.0–10.5)

## 2013-09-27 LAB — BASIC METABOLIC PANEL
BUN: 9 mg/dL (ref 6–23)
CALCIUM: 9.9 mg/dL (ref 8.4–10.5)
CO2: 22 meq/L (ref 19–32)
Chloride: 102 mEq/L (ref 96–112)
Creatinine, Ser: 0.64 mg/dL (ref 0.50–1.10)
GFR calc Af Amer: >90 mL/min (ref >90)
GFR calc non Af Amer: >90 mL/min (ref >90)
Glucose, Bld: 126 mg/dL — ABNORMAL HIGH (ref 70–99)
Potassium: 3.9 mEq/L (ref 3.7–5.3)
SODIUM: 139 meq/L (ref 137–147)

## 2013-09-27 LAB — LIPASE, BLOOD: Lipase: 15 U/L (ref 11–59)

## 2013-09-27 LAB — I-STAT TROPONIN, ED: Troponin i, poc: 0.01 ng/mL (ref 0.00–0.08)

## 2013-09-27 LAB — CK: Total CK: 82 U/L (ref 7–177)

## 2013-09-27 NOTE — Discharge Instructions (Signed)
Chest Pain (Nonspecific) °It is often hard to give a specific diagnosis for the cause of chest pain. There is always a chance that your pain could be related to something serious, such as a heart attack or a blood clot in the lungs. You need to follow up with your caregiver for further evaluation. °CAUSES  °· Heartburn. °· Pneumonia or bronchitis. °· Anxiety or stress. °· Inflammation around your heart (pericarditis) or lung (pleuritis or pleurisy). °· A blood clot in the lung. °· A collapsed lung (pneumothorax). It can develop suddenly on its own (spontaneous pneumothorax) or from injury (trauma) to the chest. °· Shingles infection (herpes zoster virus). °The chest wall is composed of bones, muscles, and cartilage. Any of these can be the source of the pain. °· The bones can be bruised by injury. °· The muscles or cartilage can be strained by coughing or overwork. °· The cartilage can be affected by inflammation and become sore (costochondritis). °DIAGNOSIS  °Lab tests or other studies, such as X-rays, electrocardiography, stress testing, or cardiac imaging, may be needed to find the cause of your pain.  °TREATMENT  °· Treatment depends on what may be causing your chest pain. Treatment may include: °· Acid blockers for heartburn. °· Anti-inflammatory medicine. °· Pain medicine for inflammatory conditions. °· Antibiotics if an infection is present. °· You may be advised to change lifestyle habits. This includes stopping smoking and avoiding alcohol, caffeine, and chocolate. °· You may be advised to keep your head raised (elevated) when sleeping. This reduces the chance of acid going backward from your stomach into your esophagus. °· Most of the time, nonspecific chest pain will improve within 2 to 3 days with rest and mild pain medicine. °HOME CARE INSTRUCTIONS  °· If antibiotics were prescribed, take your antibiotics as directed. Finish them even if you start to feel better. °· For the next few days, avoid physical  activities that bring on chest pain. Continue physical activities as directed. °· Do not smoke. °· Avoid drinking alcohol. °· Only take over-the-counter or prescription medicine for pain, discomfort, or fever as directed by your caregiver. °· Follow your caregiver's suggestions for further testing if your chest pain does not go away. °· Keep any follow-up appointments you made. If you do not go to an appointment, you could develop lasting (chronic) problems with pain. If there is any problem keeping an appointment, you must call to reschedule. °SEEK MEDICAL CARE IF:  °· You think you are having problems from the medicine you are taking. Read your medicine instructions carefully. °· Your chest pain does not go away, even after treatment. °· You develop a rash with blisters on your chest. °SEEK IMMEDIATE MEDICAL CARE IF:  °· You have increased chest pain or pain that spreads to your arm, neck, jaw, back, or abdomen. °· You develop shortness of breath, an increasing cough, or you are coughing up blood. °· You have severe back or abdominal pain, feel nauseous, or vomit. °· You develop severe weakness, fainting, or chills. °· You have a fever. °THIS IS AN EMERGENCY. Do not wait to see if the pain will go away. Get medical help at once. Call your local emergency services (911 in [place] Do not drive yourself to the hospital. °MAKE SURE YOU:  °· Understand these instructions. °· Will watch your condition. °· Will get help right away if you are not doing well or get worse. °Document Released: 03/08/2005 Document Revised: 08/21/2011 Document Reviewed: 01/02/2008 °Exit[employer]® Patient Information ©2014 [employer],   LL[employer].  Wrist Pain Wrist injuries are frequent in adults and children. A sprain is an injury to the ligaments that hold your bones together. A strain is an injury to muscle or muscle cord-like structures (tendons) from stretching or pulling. Generally, when wrists are moderately tender to touch following a fall or  injury, a break in the bone (fracture) may be present. Most wrist sprains or strains are better in 3 to 5 days, but complete healing may take several weeks. HOME CARE INSTRUCTIONS   Put ice on the injured area.  Put ice in a plastic bag.  Place a towel between your skin and the bag.  Leave the ice on for 15-20 minutes, 03-04 times a day, for the first 2 days.  Keep your arm raised above the level of your heart whenever possible to reduce swelling and pain.  Rest the injured area for at least 48 hours or as directed by your caregiver.  If a splint or elastic bandage has been applied, use it for as long as directed by your caregiver or until seen by a caregiver for a follow-up exam.  Only take over-the-counter or prescription medicines for pain, discomfort, or fever as directed by your caregiver.  Keep all follow-up appointments. You may need to follow up with a specialist or have follow-up X-rays. Improvement in pain level is not a guarantee that you did not fracture a bone in your wrist. The only way to determine whether or not you have a broken bone is by X-ray. SEEK IMMEDIATE MEDICAL CARE IF:   Your fingers are swollen, very red, white, or cold and blue.  Your fingers are numb or tingling.  You have increasing pain.  You have difficulty moving your fingers. MAKE SURE YOU:   Understand these instructions.  Will watch your condition.  Will get help right away if you are not doing well or get worse. Document Released: 03/08/2005 Document Revised: 08/21/2011 Document Reviewed: 07/20/2010 [facility] CenterExitCare Patient Information 2014 [employer]ExitCare, MarylandLLC.

## 2013-09-27 NOTE — ED Notes (Signed)
Patient returned from Doppler study

## 2013-09-27 NOTE — ED Notes (Signed)
Pt transported back to radiology 

## 2013-09-27 NOTE — ED Notes (Signed)
Pt taken to vascular
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
## 2013-09-27 NOTE — Progress Notes (Signed)
VASCULAR LAB PRELIMINARY  PRELIMINARY  PRELIMINARY  PRELIMINARY  Right upper extremity venous Doppler completed.    Preliminary report:  There is no DVT or SVT noted in the right upper extremity.   Kern AlbertaCandace R Graysyn Bache, RVT 09/27/2013, 1:24 PM

## 2013-10-07 ENCOUNTER — Emergency Department (HOSPITAL_COMMUNITY)
Admission: EM | Admit: 2013-10-07 | Discharge: 2013-10-07 | Disposition: A | Discharge status: Home / Self Care | Payer: Self-pay | Attending: Emergency Medicine | Admitting: Emergency Medicine

## 2013-10-07 ENCOUNTER — Emergency Department (HOSPITAL_COMMUNITY): Payer: Self-pay

## 2013-10-07 ENCOUNTER — Encounter (HOSPITAL_COMMUNITY): Payer: Self-pay | Admitting: Emergency Medicine

## 2013-10-07 DIAGNOSIS — Z8659 Personal history of other mental and behavioral disorders: Secondary | ICD-10-CM | POA: Insufficient documentation

## 2013-10-07 DIAGNOSIS — M940 Chondrocostal junction syndrome [Tietze]: Secondary | ICD-10-CM | POA: Insufficient documentation

## 2013-10-07 DIAGNOSIS — Z880 Allergy status to penicillin: Secondary | ICD-10-CM | POA: Insufficient documentation

## 2013-10-07 DIAGNOSIS — B009 Herpesviral infection, unspecified: Secondary | ICD-10-CM | POA: Insufficient documentation

## 2013-10-07 DIAGNOSIS — J45909 Unspecified asthma, uncomplicated: Secondary | ICD-10-CM | POA: Insufficient documentation

## 2013-10-07 DIAGNOSIS — F17200 Nicotine dependence, unspecified, uncomplicated: Secondary | ICD-10-CM | POA: Insufficient documentation

## 2013-10-07 DIAGNOSIS — Z3202 Encounter for pregnancy test, result negative: Secondary | ICD-10-CM | POA: Insufficient documentation

## 2013-10-07 DIAGNOSIS — IMO0002 Reserved for concepts with insufficient information to code with codable children: Secondary | ICD-10-CM | POA: Insufficient documentation

## 2013-10-07 DIAGNOSIS — Z87440 Personal history of urinary (tract) infections: Secondary | ICD-10-CM | POA: Insufficient documentation

## 2013-10-07 LAB — RAPID URINE DRUG SCREEN, HOSP PERFORMED
Amphetamines: NOT DETECTED
Barbiturates: NOT DETECTED
Benzodiazepines: POSITIVE — AB
Cocaine: NOT DETECTED
OPIATES: POSITIVE — AB
TETRAHYDROCANNABINOL: POSITIVE — AB

## 2013-10-07 LAB — MONONUCLEOSIS SCREEN: Mono Screen: NEGATIVE

## 2013-10-07 LAB — SALICYLATE LEVEL: Salicylate Lvl: <2 mg/dL — ABNORMAL LOW (ref 2.8–20.0)

## 2013-10-07 LAB — COMPREHENSIVE METABOLIC PANEL
ALT: 29 U/L (ref 0–35)
AST: 18 U/L (ref 0–37)
Albumin: 3.3 g/dL — ABNORMAL LOW (ref 3.5–5.2)
Alkaline Phosphatase: 148 U/L — ABNORMAL HIGH (ref 39–117)
BUN: 9 mg/dL (ref 6–23)
CALCIUM: 9.7 mg/dL (ref 8.4–10.5)
CO2: 25 mEq/L (ref 19–32)
CREATININE: 0.76 mg/dL (ref 0.50–1.10)
Chloride: 99 mEq/L (ref 96–112)
GLUCOSE: 99 mg/dL (ref 70–99)
Potassium: 3.9 mEq/L (ref 3.7–5.3)
Sodium: 138 mEq/L (ref 137–147)
Total Bilirubin: 0.3 mg/dL (ref 0.3–1.2)
Total Protein: 7.2 g/dL (ref 6.0–8.3)

## 2013-10-07 LAB — TROPONIN I: Troponin I: <0.3 ng/mL (ref <0.30)

## 2013-10-07 LAB — CBC
HCT: 38.9 % (ref 36.0–46.0)
HEMOGLOBIN: 13.5 g/dL (ref 12.0–15.0)
MCH: 32.1 pg (ref 26.0–34.0)
MCHC: 34.7 g/dL (ref 30.0–36.0)
MCV: 92.6 fL (ref 78.0–100.0)
Platelets: 251 10*3/uL (ref 150–400)
RBC: 4.2 MIL/uL (ref 3.87–5.11)
RDW: 12.7 % (ref 11.5–15.5)
WBC: 7.4 10*3/uL (ref 4.0–10.5)

## 2013-10-07 LAB — ACETAMINOPHEN LEVEL

## 2013-10-07 LAB — RAPID STREP SCREEN (MED CTR MEBANE ONLY): Streptococcus, Group A Screen (Direct): NEGATIVE

## 2013-10-07 LAB — PREGNANCY, URINE: PREG TEST UR: NEGATIVE

## 2013-10-07 LAB — ETHANOL: Alcohol, Ethyl (B): <11 mg/dL (ref 0–11)

## 2013-10-07 LAB — D-DIMER, QUANTITATIVE (NOT AT ARMC): D DIMER QUANT: 1.78 ug{FEU}/mL — AB (ref 0.00–0.48)

## 2013-10-07 MED ORDER — IOHEXOL 350 MG/ML SOLN
100.0000 mL | Freq: Once | INTRAVENOUS | Status: AC | PRN
  Administered 2013-10-07: 100 mL via INTRAVENOUS

## 2013-10-07 MED ORDER — ACYCLOVIR 400 MG PO TABS: 400.0000 mg | ORAL_TABLET | Freq: Three times a day (TID) | ORAL | Status: DC
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
## 2013-10-07 NOTE — Progress Notes (Signed)
[facility] provided pt with a list of primary care resourcee, highlighting [facility]. Patient stated that she was homeless. Explained to patient about different resources offered through [facility], including their [facility] and [facility].

## 2013-10-07 NOTE — ED Notes (Signed)
Pt c/o sores above upper lip. Sores are itching, painful and throbbing. Pt c/o midsternal soreness and tenderness. Symptoms noted yesterday by pt. Pt reports that she slept 2 and 1/2 days and woke up yesterday with these symptoms.  Stated that she took a pill a few days ago  and it made her sleep

## 2013-10-07 NOTE — Discharge Instructions (Signed)
Please call your doctor for a followup appointment within 24-48 hours. When you talk to your doctor please let them know that you were seen in the emergency department and have them acquire all of your records so that they can discuss the findings with you and formulate a treatment plan to fully care for your new and ongoing problems. Please call and setup an appointment with your primary care provider to be reassessed for the next 24-48 hours Please rest and stay hydrated Please take medications prescribed for blisters on mouth Please keep site covered-if blisters drain the fluid in the blisters are contagious - please stay away from young children Please continue to monitor symptoms closely and if symptoms are to worsen or change (fever greater than 101, chills, chest pain, shortness of breath, difficulty breathing, numbness, tingling, weakness, dizziness, headaches, nausea, vomiting, diarrhea, abdominal pain, blood in stools, black for stools) please report back to the ED immediately  Herpes Simplex Virus Herpes simplex virus is a viral infection that may infect many different areas of the body, such as the genitalia and mouth. There are two different strains of the virus: herpes simplex virus 1 (HSV-1) and herpes simplex virus 2 (HSV-2). HSV-1 is typically associated with infections of the mouth and lips. HSV-2 is associated with infections of the genitals. However, either strain of the virus may infect any area. HSV may be spread through saliva particles or sexual contact. One unusual form of HSV-1, known as herpes gladiatorum, is passed from skin-to-skin contact, such as in wrestling. SYMPTOMS   Sometimes, no symptoms.  Fever.  Headache.  Muscle aches.  Tingling.  Itching.  Tenderness.  Genital burning feeling.  Genital pain.  Pain with urination.  Pain with sexual intercourse.  Small blisters in the affected areas. RISK FACTORS   Kissing an infected person.  Sharing  eating utensils with an infected person.  Unprotected sexual activity.  Multiple sexual partners.  Direct contact sports without protective clothing.  Contact with an exposed herpes sore.  Stress, illness, and cold increase the risk of recurrence. PROGNOSIS  The primary outbreak of an HSV infection usually lasts 2 to 3 weeks. However, it has been known to last up to 6 weeks. After the primary outbreak subsides, the virus goes into a stage known as latency. During this time, there may be no physical symptoms of infection. After a period of time, some event, such as stress, cold, or illness will trigger another outbreak. This cycle of latency and outbreak may continue indefinitely. The outbreaks usually become milder over time. The body cannot rid itself of HSV. RELATED COMPLICATIONS   Recurrence.  Infection in other areas of the body, such as the eye (ocular herpetic infection, keratitis) and rarely the brain (herpetic encephalitis). TREATMENT  Many HSV infections can be treated without medicine. During an outbreak, avoid touching the sores. Ice may be used to dull the pain and suppress the virus. Exposure to the sun is a common trigger for an outbreak, so the use of sunscreen may help in such cases. Avoid sexual contact during outbreaks. During the latent periods, it is advised that you use latex condoms, which will reduce the likelihood of spreading the virus to another person. Condoms made from animal products do not protect against HSV. Female condoms cover a larger area than female condoms, and may offer the most protection from the transmission of HSV. The presence of HSV will not affect a condom's ability to protect against pregnancy. Only take medicines for pain  and discomfort if directed to do so by your caregiver. Many claims exist that certain dietary changes will prevent an outbreak, but these claims have not been proven. These claims include eating foods that are high in L-lysine and  low in arginine (i.e. yogurt, beets, apples, pears, mangoes, oily fish (such as salmon, haddock, snapper, and swordfish), soybean sprouts, chicken, and tomatoes).  Athletes may return to play once they are showing no symptoms, and they have been treated.  Document Released: 05/29/2005 Document Revised: 08/21/2011 Document Reviewed: 09/10/2008 [facility] Patient Information 2014 [place], [place].   Chest Wall Pain Chest wall pain is pain felt in or around the chest bones and muscles. It may take up to 6 weeks to get better. It may take longer if you are active. Chest wall pain can happen on its own. Other times, things like germs, injury, coughing, or exercise can cause the pain. HOME CARE   Avoid activities that make you tired or cause pain. Try not to use your chest, belly (abdominal), or side muscles. Do not use heavy weights.  Put ice on the sore area.  Put ice in a plastic bag.  Place a towel between your skin and the bag.  Leave the ice on for 15-20 minutes for the first 2 days.  Only take medicine as told by your doctor. GET HELP RIGHT AWAY IF:   You have more pain or are very uncomfortable.  You have a fever.  Your chest pain gets worse.  You have new problems.  You feel sick to your stomach (nauseous) or throw up (vomit).  You start to sweat or feel lightheaded.  You have a cough with mucus (phlegm).  You cough up blood. MAKE SURE YOU:   Understand these instructions.  Will watch your condition.  Will get help right away if you are not doing well or get worse. Document Released: 11/15/2007 Document Revised: 08/21/2011 Document Reviewed: 01/23/2011 [facility]ExitCare Patient Information 2014 [place]Care, [place].   Emergency Department Resource Guide 1) Find a Doctor and Pay Out of Pocket Although you won't have to find out who is covered by your insurance plan, it is a good idea to ask around and get recommendations. You will then need to call the office and see if the doctor  you have chosen will accept you as a new patient and what types of options they offer for patients who are self-pay. Some doctors offer discounts or will set up payment plans for their patients who do not have insurance, but you will need to ask so you aren't surprised when you get to your appointment.  2) Contact Your Local Health Department Not all health departments have doctors that can see patients for sick visits, but many do, so it is worth a call to see if yours does. If you don't know where your local health department is, you can check in your phone book. The CDC also has a tool to help you locate your state's health department, and many state websites also have listings of all of their local health departments.  3) Find a Walk-in Clinic If your illness is not likely to be very severe or complicated, you may want to try a walk in clinic. These are popping up all over the country in pharmacies, drugstores, and shopping centers. They're usually staffed by nurse practitioners or physician assistants that have been trained to treat common illnesses and complaints. They're usually fairly quick and inexpensive. However, if you have serious medical issues or chronic  medical problems, these are probably not your best option.  No Primary Care Doctor: - Call Health Connect at  [phone] - they can help you locate a primary care doctor that  accepts your insurance, provides certain services, etc. - Physician Referral Service- [phone]  Chronic Pain Problems: Organization         Address  Phone   Notes  [facility]  [phone] Patients need to be referred by their primary care doctor.   Medication Assistance: Organization         Address  Phone   Notes  [facility] Medication [facility] [address] [place], [place] [address] [phone] --Must be a resident of [facility] -- Must have NO insurance coverage whatsoever (no Medicaid/  Medicare, etc.) -- The pt. MUST have a primary care doctor that directs their care regularly and follows them in the community   [facility]  [phone]   [employer]  [phone]    Agencies that provide inexpensive medical care: Organization         Address  Phone   Notes  [facility]  [phone]   [facility]    [phone]   [facility] [address] [place], [place] [address] [phone]   [facility] [address] [place] [place] [phone]   Planned Parenthood    [phone]   [facility]    [phone]   [facility]  [address], [place] Phone:  [phone], Fax:  [phone] Hours of Operation:  9 am - 6 pm, M-F.  Also accepts Medicaid/Medicare and self-pay.  [facility] for Children  [address], [place] Phone: [phone], Fax: [phone]. Hours of Operation:  8:30 am - 5:30 pm, M-F.  Also accepts Medicaid and self-pay.  [facility] [place] [address], [place] Phone: [phone]   [facility] [address] Natasha Bence [place], [place] [phone], Ext. [phone] Mondays & Thursdays: 7-9 AM.  First 15 patients are seen on a first come, first serve basis.    Medicaid-accepting [facility] Providers:  Organization         Address  Phone   Notes  [facility] [address], [place] [phone] Also accepts self-pay patients.  [facility] [address], [place]  [phone]   [facility] [address], [place] [phone]   [facility] [address] [place] [phone]   Renaye Rakers [address], [place]   [phone] Only accepts [place] patients after they have their name applied to their card.    Self-Pay (no insurance) in [place]:  Organization         Address  Phone   Notes  Sickle Cell Patients, [facility] [address] [place], [place] [phone]   [facility] [address] [place], [place] [phone]   [facility] [place]  [address], [place] [phone]   [facility]/Dr. Osei-Bonsu  [address] [place] or [address], [place] [phone] Phone number for both [place] and [place] locations is the same.  Urgent  [facility] [address] [place] [phone]   [facility] [address] [place] or [address] [phone]   [facility] [address] [place] [phone], phone; [phone], fax Sees patients 1st and 3rd Saturday of every month.  Must not qualify for public or private insurance (i.e. Medicaid, Medicare, [place] Health Choice, Veterans' Benefits)  Household income should be no more than 200% of the poverty level The clinic cannot treat you if you are pregnant or think you are pregnant  Sexually transmitted diseases are not treated at the clinic.    Dental Care: Organization         Address  Phone  Notes  [facility] of [facility] [address] [place], [place] [phone] Accepts children up to age 77 who are enrolled in [place] or [place] Health Choice; pregnant women with a Medicaid card; and children who have applied for Medicaid or [place] Health Choice, but were declined, whose parents can pay a reduced fee at time of service.  [facility]  [address], [place] [phone] Accepts children up to age 70 who are enrolled in [place] or [place] Health Choice; pregnant women with a Medicaid card; and children who have applied for Medicaid or [place] Health Choice,  but were declined, whose parents can pay a reduced fee at time of service.  [facility]  [address] [place], [place] [phone] Patients are seen by appointment only. Walk-ins are not accepted. [facility] will see patients 66 years of age and older. Monday - Tuesday (8am-5pm) Most Wednesdays (8:30-5pm) $30 per visit, cash only  [facility]  [address], [facility] [phone] Patients are seen by appointment only. Walk-ins are not accepted. [facility] will see patients 59 years of age and older. One Wednesday Evening (Monthly: Volunteer Based).  $30 per visit, cash only  [employer]  [phone] for adults; Children under age 56, call Graduate Pediatric Dentistry at [phone]. Children aged 41-14, please call [phone] to request a pediatric application.  Dental services are provided in all areas of dental care including fillings, crowns and bridges, complete and partial dentures, implants, gum treatment, root canals, and extractions. Preventive care is also provided. Treatment is provided to both adults and children. Patients are selected via a lottery and there is often a waiting list.   [facility] [address] [place]  [phone] [email or web address]   [facility] [address] [place], [place] [phone], Ext. [phone] Second and Fourth Thursday of each month, opens at 6:30 AM; Clinic ends at 9 AM.  Patients are seen on a first-come first-served basis, and a limited number are seen during each clinic.   [facility]  [address] Ether Griffins [place], [place] [phone]   Eligibility Requirements You must have lived in [place], [place], or [place] counties for at least the last three months.   You cannot be eligible for state or federal sponsored [place], including [employer], [place], or [employer].   You generally  cannot be eligible for healthcare insurance through your employer.    How to apply: Eligibility screenings are held every Tuesday and Wednesday afternoon from 1:00 pm until 4:00 pm. You do not need an appointment for the interview!  [facility] [address], [place], [place] [phone]   [facility]  [phone]   [facility]  [phone]   [facility]  [phone]    Behavioral Health Resources in the Community: Intensive Outpatient Programs Organization         Address  Phone  Notes  [facility] [address], [place], [place] [phone]   [facility] [address], [place], [place] [phone]   [facility] [address], [place], [place]  [phone]   [facility] [address],  [place], [place] [phone] or [phone]   Substance Abuse Resources Organization         Address  Phone  Notes  [facility]  [phone]   [facility]  [phone]   [facility]  [phone]   [facility]  [phone]   Residential & Outpatient Substance Abuse Program  [phone]   Psychological Services Organization         Address  Phone  Notes  [facility]  [phone]- [phone]   [facility]  [phone]- [phone]   [facility] [address], [place] [phone] or [phone]    Mobile Crisis Teams Organization         Address  Phone  Notes  [facility], Mobile Crisis Care Unit  [phone]   [facility]  [address] [place], [place] [phone]   Doristine LocksSharon DeEsch [address] [place] [phone]    Self-Help/Support Groups Organization         Address  Phone             Notes  [facility] [place] - variety of support groups  336- [phone] Call for more  information  Narcotics Anonymous (NA), [facility] [address], [place]   2 meetings at this location   Statisticianesidential Treatment Programs Organization         Address  Phone  Notes  [facility] [address],    [place]  [phone]   [facility]  [address], [place] [phone], [place], [place] [phone]   [facility] [address], [place] [phone] Admissions: 8am-3pm M-F  [facility] [address]    [place], [place] [phone]   [facility] [address], [place], [place] [phone]   [facility] [address]  [place], [place] [phone]   [facility] - Intensive Outpatient [address], [place], [place] [phone]   [facility] ([facility] [address] [place] [address]  [place], [place] [phone] or [phone]   [facility] ([facility]) [address] [place], [place] [phone] Accepts Medicaid  [facility] [address]  [place] [phone] Substance Abuse/Addiction Treatment   [facility] Resources Organization         Address  Phone  Notes  [facility]  [phone]   Angie FavaJulie Brannon, PhD [address] [place], [place]   [phone] or [phone]   [facility]   [address] [place], [place] [phone]   [facility] [address], [place], [place] [phone] Insurance/Medicaid/sponsorship through [employer] [facility]  [facility] [address] [place] [address]  [place], [place] [phone] Therapy/tele-psych/case  °[facility] [address]  ° [place], [place] [phone]    °Dr. Arfeen  [phone]   °[facility] [place]  [employer] [place] [facility] 1) [address],  °2) [address], [place] °3)  [address], [place] [phone]  [phone] °([phone] ° °([phone]) [phone]   °[place] Child Abuse Hotline (336) 342-1394 or [phone] (After Hours)    ° ° °

## 2013-10-08 LAB — HSV 1 ANTIBODY, IGG: HSV 1 Glycoprotein G Ab, IgG: 3.96 IV — ABNORMAL HIGH

## 2013-10-08 LAB — HSV 2 ANTIBODY, IGG: HSV 2 GLYCOPROTEIN G AB, IGG: 6.79 IV — AB

## 2013-10-09 LAB — CULTURE, GROUP A STREP

## 2013-10-10 NOTE — ED Provider Notes (Signed)
Medical screening examination/treatment/procedure(s) were performed by non-physician practitioner and as supervising physician I was immediately available for consultation/collaboration.   EKG Interpretation   Date/Time:  Tuesday October 07 2013 12:09:36 EDT Ventricular Rate:  83 PR Interval:  144 QRS Duration: 72 QT Interval:  360 QTC Calculation: 423 R Axis:   80 Text Interpretation:  Sinus rhythm ED PHYSICIAN INTERPRETATION AVAILABLE  IN [facility] Confirmed by TEST, Record ([id number]) on 10/09/2013 7:25:39  AM       Juliet RudeNathan R. Rubin PayorPickering, MD 10/10/13 [id number]

## 2014-04-13 ENCOUNTER — Encounter (HOSPITAL_COMMUNITY): Payer: Self-pay | Admitting: Emergency Medicine

## 2014-09-30 ENCOUNTER — Emergency Department (HOSPITAL_COMMUNITY): Payer: Medicaid Other

## 2014-09-30 ENCOUNTER — Encounter (HOSPITAL_COMMUNITY): Payer: Self-pay | Admitting: *Deleted

## 2014-09-30 ENCOUNTER — Inpatient Hospital Stay (HOSPITAL_COMMUNITY)
Admission: EM | Admit: 2014-09-30 | Discharge: 2014-10-08 | DRG: 917 | Disposition: A | Discharge status: Rehabilitation Facility | Payer: Medicaid Other | Attending: Internal Medicine | Admitting: Internal Medicine

## 2014-09-30 DIAGNOSIS — J9601 Acute respiratory failure with hypoxia: Secondary | ICD-10-CM

## 2014-09-30 DIAGNOSIS — F17210 Nicotine dependence, cigarettes, uncomplicated: Secondary | ICD-10-CM | POA: Diagnosis present

## 2014-09-30 DIAGNOSIS — F329 Major depressive disorder, single episode, unspecified: Secondary | ICD-10-CM | POA: Diagnosis present

## 2014-09-30 DIAGNOSIS — J690 Pneumonitis due to inhalation of food and vomit: Secondary | ICD-10-CM | POA: Insufficient documentation

## 2014-09-30 DIAGNOSIS — R52 Pain, unspecified: Secondary | ICD-10-CM

## 2014-09-30 DIAGNOSIS — R739 Hyperglycemia, unspecified: Secondary | ICD-10-CM | POA: Diagnosis present

## 2014-09-30 DIAGNOSIS — B9620 Unspecified Escherichia coli [E. coli] as the cause of diseases classified elsewhere: Secondary | ICD-10-CM | POA: Diagnosis present

## 2014-09-30 DIAGNOSIS — L03116 Cellulitis of left lower limb: Secondary | ICD-10-CM | POA: Diagnosis present

## 2014-09-30 DIAGNOSIS — T424X1A Poisoning by benzodiazepines, accidental (unintentional), initial encounter: Secondary | ICD-10-CM | POA: Diagnosis present

## 2014-09-30 DIAGNOSIS — M659 Synovitis and tenosynovitis, unspecified: Secondary | ICD-10-CM | POA: Diagnosis present

## 2014-09-30 DIAGNOSIS — A419 Sepsis, unspecified organism: Secondary | ICD-10-CM | POA: Diagnosis not present

## 2014-09-30 DIAGNOSIS — N170 Acute kidney failure with tubular necrosis: Secondary | ICD-10-CM | POA: Diagnosis present

## 2014-09-30 DIAGNOSIS — L03114 Cellulitis of left upper limb: Secondary | ICD-10-CM | POA: Diagnosis present

## 2014-09-30 DIAGNOSIS — T400X1A Poisoning by opium, accidental (unintentional), initial encounter: Principal | ICD-10-CM | POA: Diagnosis present

## 2014-09-30 DIAGNOSIS — T796XXA Traumatic ischemia of muscle, initial encounter: Secondary | ICD-10-CM | POA: Diagnosis not present

## 2014-09-30 DIAGNOSIS — N390 Urinary tract infection, site not specified: Secondary | ICD-10-CM | POA: Diagnosis present

## 2014-09-30 DIAGNOSIS — F1310 Sedative, hypnotic or anxiolytic abuse, uncomplicated: Secondary | ICD-10-CM | POA: Diagnosis present

## 2014-09-30 DIAGNOSIS — F1210 Cannabis abuse, uncomplicated: Secondary | ICD-10-CM | POA: Diagnosis present

## 2014-09-30 DIAGNOSIS — J9600 Acute respiratory failure, unspecified whether with hypoxia or hypercapnia: Secondary | ICD-10-CM | POA: Diagnosis present

## 2014-09-30 DIAGNOSIS — R6521 Severe sepsis with septic shock: Secondary | ICD-10-CM | POA: Diagnosis not present

## 2014-09-30 DIAGNOSIS — E872 Acidosis, unspecified: Secondary | ICD-10-CM

## 2014-09-30 DIAGNOSIS — E876 Hypokalemia: Secondary | ICD-10-CM | POA: Diagnosis not present

## 2014-09-30 DIAGNOSIS — R5381 Other malaise: Secondary | ICD-10-CM | POA: Diagnosis not present

## 2014-09-30 DIAGNOSIS — N179 Acute kidney failure, unspecified: Secondary | ICD-10-CM

## 2014-09-30 DIAGNOSIS — M6282 Rhabdomyolysis: Secondary | ICD-10-CM | POA: Diagnosis present

## 2014-09-30 DIAGNOSIS — R609 Edema, unspecified: Secondary | ICD-10-CM

## 2014-09-30 DIAGNOSIS — T1491 Suicide attempt: Secondary | ICD-10-CM | POA: Diagnosis not present

## 2014-09-30 DIAGNOSIS — F419 Anxiety disorder, unspecified: Secondary | ICD-10-CM | POA: Diagnosis present

## 2014-09-30 DIAGNOSIS — F1110 Opioid abuse, uncomplicated: Secondary | ICD-10-CM | POA: Diagnosis present

## 2014-09-30 DIAGNOSIS — D62 Acute posthemorrhagic anemia: Secondary | ICD-10-CM | POA: Diagnosis not present

## 2014-09-30 DIAGNOSIS — R062 Wheezing: Secondary | ICD-10-CM | POA: Insufficient documentation

## 2014-09-30 DIAGNOSIS — R4182 Altered mental status, unspecified: Secondary | ICD-10-CM | POA: Diagnosis present

## 2014-09-30 DIAGNOSIS — I998 Other disorder of circulatory system: Secondary | ICD-10-CM

## 2014-09-30 DIAGNOSIS — Z515 Encounter for palliative care: Secondary | ICD-10-CM | POA: Insufficient documentation

## 2014-09-30 DIAGNOSIS — R748 Abnormal levels of other serum enzymes: Secondary | ICD-10-CM | POA: Diagnosis not present

## 2014-09-30 DIAGNOSIS — Y92002 Bathroom of unspecified non-institutional (private) residence single-family (private) house as the place of occurrence of the external cause: Secondary | ICD-10-CM

## 2014-09-30 DIAGNOSIS — G92 Toxic encephalopathy: Secondary | ICD-10-CM | POA: Diagnosis present

## 2014-09-30 DIAGNOSIS — Z79899 Other long term (current) drug therapy: Secondary | ICD-10-CM

## 2014-09-30 DIAGNOSIS — G5631 Lesion of radial nerve, right upper limb: Secondary | ICD-10-CM | POA: Diagnosis present

## 2014-09-30 DIAGNOSIS — L03317 Cellulitis of buttock: Secondary | ICD-10-CM | POA: Diagnosis present

## 2014-09-30 DIAGNOSIS — T424X2A Poisoning by benzodiazepines, intentional self-harm, initial encounter: Secondary | ICD-10-CM | POA: Diagnosis not present

## 2014-09-30 DIAGNOSIS — M21372 Foot drop, left foot: Secondary | ICD-10-CM | POA: Diagnosis present

## 2014-09-30 DIAGNOSIS — G5692 Unspecified mononeuropathy of left upper limb: Secondary | ICD-10-CM | POA: Diagnosis present

## 2014-09-30 DIAGNOSIS — E875 Hyperkalemia: Secondary | ICD-10-CM | POA: Diagnosis present

## 2014-09-30 DIAGNOSIS — F418 Other specified anxiety disorders: Secondary | ICD-10-CM | POA: Insufficient documentation

## 2014-09-30 DIAGNOSIS — G9340 Encephalopathy, unspecified: Secondary | ICD-10-CM | POA: Insufficient documentation

## 2014-09-30 DIAGNOSIS — M792 Neuralgia and neuritis, unspecified: Secondary | ICD-10-CM | POA: Insufficient documentation

## 2014-09-30 DIAGNOSIS — T391X2A Poisoning by 4-Aminophenol derivatives, intentional self-harm, initial encounter: Secondary | ICD-10-CM | POA: Diagnosis not present

## 2014-09-30 DIAGNOSIS — R0600 Dyspnea, unspecified: Secondary | ICD-10-CM

## 2014-09-30 LAB — HEPATIC FUNCTION PANEL
ALBUMIN: 2.6 g/dL — AB (ref 3.5–5.2)
ALT: 109 U/L — ABNORMAL HIGH (ref 0–35)
AST: 307 U/L — ABNORMAL HIGH (ref 0–37)
Alkaline Phosphatase: 86 U/L (ref 39–117)
BILIRUBIN DIRECT: 0.2 mg/dL (ref 0.0–0.5)
Indirect Bilirubin: 0.6 mg/dL (ref 0.3–0.9)
TOTAL PROTEIN: 4.9 g/dL — AB (ref 6.0–8.3)
Total Bilirubin: 0.8 mg/dL (ref 0.3–1.2)

## 2014-09-30 LAB — URINALYSIS, ROUTINE W REFLEX MICROSCOPIC
Glucose, UA: NEGATIVE mg/dL
Ketones, ur: NEGATIVE mg/dL
Nitrite: POSITIVE — AB
Protein, ur: 30 mg/dL — AB
SPECIFIC GRAVITY, URINE: 1.015 (ref 1.005–1.030)
UROBILINOGEN UA: 2 mg/dL — AB (ref 0.0–1.0)
pH: 5.5 (ref 5.0–8.0)

## 2014-09-30 LAB — RAPID URINE DRUG SCREEN, HOSP PERFORMED
Amphetamines: NOT DETECTED
BARBITURATES: NOT DETECTED
BENZODIAZEPINES: POSITIVE — AB
Cocaine: NOT DETECTED
OPIATES: POSITIVE — AB
Tetrahydrocannabinol: POSITIVE — AB

## 2014-09-30 LAB — BASIC METABOLIC PANEL
ANION GAP: 12 (ref 5–15)
ANION GAP: 10 (ref 5–15)
Anion gap: 11 (ref 5–15)
BUN: 33 mg/dL — ABNORMAL HIGH (ref 6–23)
BUN: 29 mg/dL — ABNORMAL HIGH (ref 6–23)
BUN: 26 mg/dL — AB (ref 6–23)
CALCIUM: 6.9 mg/dL — AB (ref 8.4–10.5)
CHLORIDE: 103 mmol/L (ref 96–112)
CHLORIDE: 113 mmol/L — AB (ref 96–112)
CHLORIDE: 111 mmol/L (ref 96–112)
CO2: 20 mmol/L (ref 19–32)
CO2: 16 mmol/L — ABNORMAL LOW (ref 19–32)
CO2: 16 mmol/L — ABNORMAL LOW (ref 19–32)
CREATININE: 2.39 mg/dL — AB (ref 0.50–1.10)
CREATININE: 1.78 mg/dL — AB (ref 0.50–1.10)
CREATININE: 1.63 mg/dL — AB (ref 0.50–1.10)
Calcium: 6.2 mg/dL — CL (ref 8.4–10.5)
Calcium: 6.2 mg/dL — CL (ref 8.4–10.5)
GFR calc Af Amer: 29 mL/min — ABNORMAL LOW (ref >90)
GFR calc Af Amer: 41 mL/min — ABNORMAL LOW (ref >90)
GFR calc non Af Amer: 25 mL/min — ABNORMAL LOW (ref >90)
GFR calc non Af Amer: 36 mL/min — ABNORMAL LOW (ref >90)
GFR, EST AFRICAN AMERICAN: 46 mL/min — AB (ref >90)
GFR, EST NON AFRICAN AMERICAN: 40 mL/min — AB (ref >90)
GLUCOSE: 200 mg/dL — AB (ref 70–99)
GLUCOSE: 165 mg/dL — AB (ref 70–99)
GLUCOSE: 149 mg/dL — AB (ref 70–99)
Potassium: 7.5 mmol/L (ref 3.5–5.1)
Potassium: 5.3 mmol/L — ABNORMAL HIGH (ref 3.5–5.1)
Potassium: 5.5 mmol/L — ABNORMAL HIGH (ref 3.5–5.1)
SODIUM: 138 mmol/L (ref 135–145)
Sodium: 135 mmol/L (ref 135–145)
Sodium: 139 mmol/L (ref 135–145)

## 2014-09-30 LAB — CBC WITH DIFFERENTIAL/PLATELET
BASOS ABS: 0 10*3/uL (ref 0.0–0.1)
BASOS PCT: 0 % (ref 0–1)
BASOS PCT: 0 % (ref 0–1)
Basophils Absolute: 0 10*3/uL (ref 0.0–0.1)
Eosinophils Absolute: 0 10*3/uL (ref 0.0–0.7)
Eosinophils Absolute: 0 10*3/uL (ref 0.0–0.7)
Eosinophils Relative: 0 % (ref 0–5)
Eosinophils Relative: 0 % (ref 0–5)
HCT: 44.7 % (ref 36.0–46.0)
HEMATOCRIT: 41.1 % (ref 36.0–46.0)
HEMOGLOBIN: 13.7 g/dL (ref 12.0–15.0)
Hemoglobin: 14.1 g/dL (ref 12.0–15.0)
LYMPHS ABS: 0.9 10*3/uL (ref 0.7–4.0)
Lymphocytes Relative: 5 % — ABNORMAL LOW (ref 12–46)
Lymphocytes Relative: 7 % — ABNORMAL LOW (ref 12–46)
Lymphs Abs: 1 10*3/uL (ref 0.7–4.0)
MCH: 31.1 pg (ref 26.0–34.0)
MCH: 32 pg (ref 26.0–34.0)
MCHC: 31.5 g/dL (ref 30.0–36.0)
MCHC: 33.3 g/dL (ref 30.0–36.0)
MCV: 98.5 fL (ref 78.0–100.0)
MCV: 96 fL (ref 78.0–100.0)
MONO ABS: 1.8 10*3/uL — AB (ref 0.1–1.0)
MONO ABS: 0.8 10*3/uL (ref 0.1–1.0)
MONOS PCT: 6 % (ref 3–12)
Monocytes Relative: 9 % (ref 3–12)
NEUTROS ABS: 10.9 10*3/uL — AB (ref 1.7–7.7)
NEUTROS PCT: 86 % — AB (ref 43–77)
Neutro Abs: 16.3 10*3/uL — ABNORMAL HIGH (ref 1.7–7.7)
Neutrophils Relative %: 87 % — ABNORMAL HIGH (ref 43–77)
Platelets: 340 10*3/uL (ref 150–400)
Platelets: 244 10*3/uL (ref 150–400)
RBC: 4.54 MIL/uL (ref 3.87–5.11)
RBC: 4.28 MIL/uL (ref 3.87–5.11)
RDW: 14.4 % (ref 11.5–15.5)
RDW: 14.4 % (ref 11.5–15.5)
WBC: 19.1 10*3/uL — AB (ref 4.0–10.5)
WBC: 12.6 10*3/uL — ABNORMAL HIGH (ref 4.0–10.5)

## 2014-09-30 LAB — COMPREHENSIVE METABOLIC PANEL
ALT: 85 U/L — ABNORMAL HIGH (ref 0–35)
AST: 174 U/L — ABNORMAL HIGH (ref 0–37)
Albumin: 3.7 g/dL (ref 3.5–5.2)
Alkaline Phosphatase: 111 U/L (ref 39–117)
Anion gap: 19 — ABNORMAL HIGH (ref 5–15)
BILIRUBIN TOTAL: 0.8 mg/dL (ref 0.3–1.2)
BUN: 34 mg/dL — ABNORMAL HIGH (ref 6–23)
CHLORIDE: 101 mmol/L (ref 96–112)
CO2: 18 mmol/L — AB (ref 19–32)
CREATININE: 2.93 mg/dL — AB (ref 0.50–1.10)
Calcium: 8.2 mg/dL — ABNORMAL LOW (ref 8.4–10.5)
GFR calc Af Amer: 23 mL/min — ABNORMAL LOW (ref >90)
GFR, EST NON AFRICAN AMERICAN: 20 mL/min — AB (ref >90)
Glucose, Bld: 127 mg/dL — ABNORMAL HIGH (ref 70–99)
Potassium: 6.5 mmol/L (ref 3.5–5.1)
Sodium: 138 mmol/L (ref 135–145)
Total Protein: 6.6 g/dL (ref 6.0–8.3)

## 2014-09-30 LAB — I-STAT CG4 LACTIC ACID, ED: Lactic Acid, Venous: 6.38 mmol/L (ref 0.5–2.0)

## 2014-09-30 LAB — LACTIC ACID, PLASMA
LACTIC ACID, VENOUS: 3.1 mmol/L — AB (ref 0.5–2.0)
Lactic Acid, Venous: 3.5 mmol/L (ref 0.5–2.0)

## 2014-09-30 LAB — PROCALCITONIN: PROCALCITONIN: 15.32 ng/mL

## 2014-09-30 LAB — CK TOTAL AND CKMB (NOT AT ARMC)
CK, MB: 294 ng/mL (ref 0.3–4.0)
RELATIVE INDEX: 1.3 (ref 0.0–2.5)
Total CK: 23240 U/L — ABNORMAL HIGH (ref 7–177)

## 2014-09-30 LAB — I-STAT ARTERIAL BLOOD GAS, ED
ACID-BASE DEFICIT: 14 mmol/L — AB (ref 0.0–2.0)
Bicarbonate: 17.7 mEq/L — ABNORMAL LOW (ref 20.0–24.0)
O2 Saturation: 100 %
PCO2 ART: 65.6 mmHg — AB (ref 35.0–45.0)
PO2 ART: 329 mmHg — AB (ref 80.0–100.0)
TCO2: 20 mmol/L (ref 0–100)
pH, Arterial: 7.038 — CL (ref 7.350–7.450)

## 2014-09-30 LAB — CBG MONITORING, ED: Glucose-Capillary: 169 mg/dL — ABNORMAL HIGH (ref 70–99)

## 2014-09-30 LAB — CK: CK TOTAL: 6610 U/L — AB (ref 7–177)

## 2014-09-30 LAB — URINE MICROSCOPIC-ADD ON

## 2014-09-30 LAB — GLUCOSE, CAPILLARY
GLUCOSE-CAPILLARY: 193 mg/dL — AB (ref 70–99)
Glucose-Capillary: 158 mg/dL — ABNORMAL HIGH (ref 70–99)

## 2014-09-30 LAB — MRSA PCR SCREENING: MRSA by PCR: NEGATIVE

## 2014-09-30 LAB — ACETAMINOPHEN LEVEL: Acetaminophen (Tylenol), Serum: <10 ug/mL — ABNORMAL LOW (ref 10–30)

## 2014-09-30 LAB — ETHANOL: Alcohol, Ethyl (B): <5 mg/dL (ref 0–9)

## 2014-09-30 MED ORDER — SODIUM CHLORIDE 0.9 % IV BOLUS (SEPSIS)
1000.0000 mL | Freq: Once | INTRAVENOUS | Status: AC
  Administered 2014-09-30: 1000 mL via INTRAVENOUS

## 2014-09-30 MED ORDER — DEXTROSE 5 % IV SOLN
2.0000 g | Freq: Once | INTRAVENOUS | Status: AC
  Administered 2014-09-30: 2 g via INTRAVENOUS
  Filled 2014-09-30: qty 2

## 2014-09-30 MED ORDER — PANTOPRAZOLE SODIUM 40 MG IV SOLR
40.0000 mg | Freq: Every day | INTRAVENOUS | Status: DC
  Filled 2014-09-30: qty 40

## 2014-09-30 MED ORDER — ETOMIDATE 2 MG/ML IV SOLN
INTRAVENOUS | Status: AC | PRN
  Administered 2014-09-30: 20 mg via INTRAVENOUS

## 2014-09-30 MED ORDER — DEXTROSE 50 % IV SOLN: 1.0000 | Freq: Once | INTRAVENOUS | Status: DC

## 2014-09-30 MED ORDER — LEVOFLOXACIN IN D5W 500 MG/100ML IV SOLN
500.0000 mg | INTRAVENOUS | Status: DC
  Filled 2014-09-30: qty 100

## 2014-09-30 MED ORDER — SODIUM CHLORIDE 0.9 % IV SOLN
INTRAVENOUS | Status: AC | PRN
  Administered 2014-09-30: 1000 mL via INTRAVENOUS

## 2014-09-30 MED ORDER — ROCURONIUM BROMIDE 50 MG/5ML IV SOLN
INTRAVENOUS | Status: AC
  Filled 2014-09-30: qty 2

## 2014-09-30 MED ORDER — DOCUSATE SODIUM 50 MG/5ML PO LIQD
100.0000 mg | Freq: Two times a day (BID) | ORAL | Status: DC | PRN
  Administered 2014-10-05: 100 mg
  Filled 2014-09-30 (×3): qty 10

## 2014-09-30 MED ORDER — ETOMIDATE 2 MG/ML IV SOLN
INTRAVENOUS | Status: AC
  Filled 2014-09-30: qty 20

## 2014-09-30 MED ORDER — FENTANYL BOLUS VIA INFUSION
50.0000 ug | INTRAVENOUS | Status: DC | PRN
  Administered 2014-09-30: 50 ug via INTRAVENOUS
  Filled 2014-09-30: qty 50

## 2014-09-30 MED ORDER — VANCOMYCIN HCL 500 MG IV SOLR
500.0000 mg | INTRAVENOUS | Status: DC
  Administered 2014-10-01: 500 mg via INTRAVENOUS
  Filled 2014-09-30 (×2): qty 500

## 2014-09-30 MED ORDER — PANTOPRAZOLE SODIUM 40 MG IV SOLR
40.0000 mg | Freq: Every day | INTRAVENOUS | Status: AC
  Administered 2014-09-30 – 2014-10-01 (×2): 40 mg via INTRAVENOUS
  Filled 2014-09-30: qty 40

## 2014-09-30 MED ORDER — SODIUM BICARBONATE 8.4 % IV SOLN
INTRAVENOUS | Status: DC
  Administered 2014-09-30 – 2014-10-01 (×3): via INTRAVENOUS
  Filled 2014-09-30 (×6): qty 150

## 2014-09-30 MED ORDER — CHLORHEXIDINE GLUCONATE 0.12 % MT SOLN
15.0000 mL | Freq: Two times a day (BID) | OROMUCOSAL | Status: DC
  Administered 2014-09-30 – 2014-10-01 (×2): 15 mL via OROMUCOSAL
  Filled 2014-09-30 (×2): qty 15

## 2014-09-30 MED ORDER — SODIUM CHLORIDE 0.9 % IV SOLN
INTRAVENOUS | Status: DC
  Administered 2014-09-30 – 2014-10-01 (×2): via INTRAVENOUS

## 2014-09-30 MED ORDER — SODIUM CHLORIDE 0.9 % IV BOLUS (SEPSIS)
1000.0000 mL | INTRAVENOUS | Status: AC
  Administered 2014-09-30 (×2): 1000 mL via INTRAVENOUS

## 2014-09-30 MED ORDER — INSULIN ASPART 100 UNIT/ML IV SOLN: 10.0000 [IU] | Freq: Once | INTRAVENOUS | Status: DC

## 2014-09-30 MED ORDER — LEVOFLOXACIN IN D5W 750 MG/150ML IV SOLN
750.0000 mg | Freq: Once | INTRAVENOUS | Status: AC
  Administered 2014-09-30: 750 mg via INTRAVENOUS
  Filled 2014-09-30: qty 150

## 2014-09-30 MED ORDER — SODIUM CHLORIDE 0.9 % IV SOLN
25.0000 ug/h | INTRAVENOUS | Status: DC
  Administered 2014-09-30: 100 ug/h via INTRAVENOUS
  Administered 2014-10-01: 400 ug/h via INTRAVENOUS
  Filled 2014-09-30 (×5): qty 50

## 2014-09-30 MED ORDER — FENTANYL CITRATE (PF) 100 MCG/2ML IJ SOLN
INTRAMUSCULAR | Status: AC
  Filled 2014-09-30: qty 2

## 2014-09-30 MED ORDER — SUCCINYLCHOLINE CHLORIDE 20 MG/ML IJ SOLN
INTRAMUSCULAR | Status: AC
  Filled 2014-09-30: qty 1

## 2014-09-30 MED ORDER — VANCOMYCIN HCL IN DEXTROSE 1-5 GM/200ML-% IV SOLN
1000.0000 mg | Freq: Once | INTRAVENOUS | Status: AC
  Administered 2014-09-30: 1000 mg via INTRAVENOUS
  Filled 2014-09-30: qty 200

## 2014-09-30 MED ORDER — ROCURONIUM BROMIDE 50 MG/5ML IV SOLN
INTRAVENOUS | Status: AC | PRN
  Administered 2014-09-30: 70 mg via INTRAVENOUS

## 2014-09-30 MED ORDER — CETYLPYRIDINIUM CHLORIDE 0.05 % MT LIQD
7.0000 mL | Freq: Four times a day (QID) | OROMUCOSAL | Status: DC
  Administered 2014-10-01 (×2): 7 mL via OROMUCOSAL

## 2014-09-30 MED ORDER — MIDAZOLAM HCL 2 MG/2ML IJ SOLN
2.0000 mg | INTRAMUSCULAR | Status: DC | PRN
  Administered 2014-09-30 – 2014-10-01 (×4): 2 mg via INTRAVENOUS
  Filled 2014-09-30 (×6): qty 2

## 2014-09-30 MED ORDER — DEXTROSE 5 % IV SOLN
1.0000 g | Freq: Three times a day (TID) | INTRAVENOUS | Status: DC
  Administered 2014-09-30 – 2014-10-01 (×2): 1 g via INTRAVENOUS
  Filled 2014-09-30 (×3): qty 1

## 2014-09-30 MED ORDER — ONDANSETRON HCL 4 MG/2ML IJ SOLN: 4.0000 mg | Freq: Four times a day (QID) | INTRAMUSCULAR | Status: DC | PRN

## 2014-09-30 MED ORDER — SODIUM CHLORIDE 0.9 % IV SOLN: INTRAVENOUS | Status: DC

## 2014-09-30 MED ORDER — SODIUM CHLORIDE 0.9 % IV SOLN
1.0000 g | Freq: Once | INTRAVENOUS | Status: AC
  Administered 2014-09-30: 1 g via INTRAVENOUS
  Filled 2014-09-30: qty 10

## 2014-09-30 MED ORDER — FENTANYL CITRATE (PF) 100 MCG/2ML IJ SOLN
50.0000 ug | Freq: Once | INTRAMUSCULAR | Status: AC
  Administered 2014-09-30: 50 ug via INTRAVENOUS

## 2014-09-30 MED ORDER — MIDAZOLAM HCL 2 MG/2ML IJ SOLN
2.0000 mg | INTRAMUSCULAR | Status: DC | PRN
  Administered 2014-09-30: 2 mg via INTRAVENOUS

## 2014-09-30 MED ORDER — PROPOFOL 1000 MG/100ML IV EMUL
5.0000 ug/kg/min | Freq: Once | INTRAVENOUS | Status: AC
  Administered 2014-09-30: 5 ug/kg/min via INTRAVENOUS

## 2014-09-30 MED ORDER — LEVOFLOXACIN IN D5W 500 MG/100ML IV SOLN: 500.0000 mg | INTRAVENOUS | Status: DC

## 2014-09-30 MED ORDER — HEPARIN SODIUM (PORCINE) 5000 UNIT/ML IJ SOLN
5000.0000 [IU] | Freq: Three times a day (TID) | INTRAMUSCULAR | Status: DC
  Administered 2014-09-30 – 2014-10-08 (×25): 5000 [IU] via SUBCUTANEOUS
  Filled 2014-09-30 (×27): qty 1

## 2014-09-30 MED ORDER — PROPOFOL 1000 MG/100ML IV EMUL
INTRAVENOUS | Status: AC
  Filled 2014-09-30: qty 100

## 2014-09-30 MED ORDER — LIDOCAINE HCL (CARDIAC) 20 MG/ML IV SOLN
INTRAVENOUS | Status: AC
Start: 2014-09-30 — End: 2014-10-01
  Filled 2014-09-30: qty 5

## 2014-09-30 MED ORDER — ALBUTEROL SULFATE (2.5 MG/3ML) 0.083% IN NEBU
2.5000 mg | INHALATION_SOLUTION | RESPIRATORY_TRACT | Status: DC | PRN
  Administered 2014-10-03: 2.5 mg via RESPIRATORY_TRACT
  Filled 2014-09-30: qty 3

## 2014-09-30 NOTE — ED Notes (Signed)
NOTIFIED DR. S. ZACKOWSKI OF PATIENTS LAB RESULTS OF CG4+LACTIC ACID @14 :32PM 09/30/2014.
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
## 2014-09-30 NOTE — Progress Notes (Signed)
Decreased movement noted on LLE and LUE. Pt also grimaces with any ROM or manipulation of LUE/LLE. Also note decreased ability to move Left toes as compared to Right.

## 2014-09-30 NOTE — ED Notes (Signed)
Pt transported to CT by this RN.

## 2014-09-30 NOTE — ED Notes (Addendum)
Per EMS- pt was found by family in the bathroom floor at midnight. Pt was unresponsive and agonal upon EMS arrival. Bloody vomit noted to floor and face when EMS arrived. Pt received 2mg  IM narcan by fire. Pupils are dilated. Respirations improved pt remained unresponsive. 18g L hand. 6.0 ET tube in left nostril. CBG 117. Empty bag found on floor of bathroom. Unknown drug use history.

## 2014-09-30 NOTE — Procedures (Signed)
Intubation Procedure Note Renato GailsCrystal Regal [id number] February 24, 1976  Procedure: Intubation Indications: Airway protection and maintenance  Procedure Details Consent: Unable to obtain consent because of emergent medical necessity. Time Out: Verified patient identification, verified procedure, site/side was marked, verified correct patient position, special equipment/implants available, medications/allergies/relevent history reviewed, required imaging and test results available.  Performed  Maximum sterile technique was used including cap, gloves, gown, hand hygiene and mask.  MAC and 3    Evaluation Hemodynamic Status: BP stable throughout; O2 sats: stable throughout Patient's Current Condition: stable Complications: No apparent complications Patient did tolerate procedure well. Chest X-ray ordered to verify placement.  CXR: pending  Pt. Was intubated at 7.5 tube at 23 at the lip.   Lilli LightSchleuning, Katrianna Friesenhahn D 09/30/2014

## 2014-09-30 NOTE — Progress Notes (Signed)
RN called RT to patient room for patient desat.  Performed recruitment maneuver without any improvement.  Bagged lavaged patient and obtained a small amount of thick tan secretions.  Sats increased to 91%.  Increased FIO2 to 70%.  Sats currently 93%.  Will continue to monitor.

## 2014-09-30 NOTE — Progress Notes (Signed)
Chaplain paged to visit pt family.   Pt's mom, Okey RegalCarol and family friend Cheyenne AdasRobert Davis were present.   Pt's mom noted that pt has had drug problem for many years taking pills "Xanax 5-10 at a time" and alcohol abuse.   Molly MaduroRobert found her curled on the floor and a little baggie with the residue of a white substance on the counter".   Pt is married but she and her husband are currently separated but still talk per pt mom. He is a Naval architecttruck driver and often takes routes that traverse the states.   Pt Mom recently had a cancerous lump removed and been dealing with multiple life changes and hardships.   Chaplain Fredrik CoveRoger is with family as they are bedside.  Gala RomneyBrown, Traveon Louro J, Chaplain 09/30/2014 5:17 PM

## 2014-09-30 NOTE — Progress Notes (Signed)
Chaplain assumed this case from Eastman ChemicalChaplain Larry Brown shortly after 5:00. He was in consult B with pt's mom Enid DerryCarole and family friend Molly MaduroRobert and introduced them to me. A nurse then appeared and led them to bedside at Trauma B, and BB&[employer] CorporationChaplain Brown quickly undated me on the situation. Chaplain provided empathic listening and ministry of presence. I located RN on two occasions when pt mom had a question. Pt mom noticed pt moving her head and RN said that was an encouraging sign. Accompanied family to 77M entrance when pt was moved there about 5:40, then led them to waiting area and asked 77M secretary to inform nurse of their presence.
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
## 2014-09-30 NOTE — ED Notes (Addendum)
Placed size 14 frenc temp  foley into patient dark yellow urine in return

## 2014-09-30 NOTE — ED Notes (Signed)
Dr. Deretha EmoryZackowski notified of pts BP and temperature. Bair hugger applied.

## 2014-09-30 NOTE — Consult Note (Signed)
I WAS ASKED TO EXAMINE LEFT WRIST BY INTENSIVE CARE MD CHART REVIEWED  LEFT WRIST: MILD ECCHYMOSIS AND INDURATION DORSUM OF DISTAL WRIST REGION NO FLUCTUANCE MINIMAL REDNESS, FINGERS A BIT SWOLLEN DISTALLY NO SWELLING VOLARLY NO EVIDENCE OF NECROTIZING FASCITIS GOOD RADIAL PULSE  IMP: LEFT WRIST SWELLING AND CONTUSIVE INJURY  PLAN: ICE ELEVATION DO NOT RECOMMEND SURGERY ON HAND WILL CONTINUE TO OBSERVE ON IV ABX PLEASE CONTACT ME DIRECTLY IF CLINICAL COURSE CHANGES [phone] CELL

## 2014-09-30 NOTE — Progress Notes (Signed)
Critical Lactic Acid of 3.1, calcium 6.2, and CKMB of 294-- paged to Dr Newton PiggKennery  [facility]. Visitation rules covered with family. Family states she takes Xanax and Oxycontin but dont think she has a perscription for either.

## 2014-10-01 ENCOUNTER — Inpatient Hospital Stay (HOSPITAL_COMMUNITY): Payer: Medicaid Other

## 2014-10-01 ENCOUNTER — Other Ambulatory Visit (HOSPITAL_COMMUNITY): Payer: Self-pay

## 2014-10-01 LAB — BASIC METABOLIC PANEL
ANION GAP: 10 (ref 5–15)
ANION GAP: 7 (ref 5–15)
BUN: 23 mg/dL (ref 6–23)
BUN: 11 mg/dL (ref 6–23)
CALCIUM: 6.5 mg/dL — AB (ref 8.4–10.5)
CALCIUM: 6.2 mg/dL — AB (ref 8.4–10.5)
CHLORIDE: 111 mmol/L (ref 96–112)
CO2: 22 mmol/L (ref 19–32)
CO2: 32 mmol/L (ref 19–32)
Chloride: 99 mmol/L (ref 96–112)
Creatinine, Ser: 1.46 mg/dL — ABNORMAL HIGH (ref 0.50–1.10)
Creatinine, Ser: 1.01 mg/dL (ref 0.50–1.10)
GFR calc Af Amer: 52 mL/min — ABNORMAL LOW (ref >90)
GFR calc Af Amer: 82 mL/min — ABNORMAL LOW (ref >90)
GFR calc non Af Amer: 45 mL/min — ABNORMAL LOW (ref >90)
GFR calc non Af Amer: 71 mL/min — ABNORMAL LOW (ref >90)
Glucose, Bld: 106 mg/dL — ABNORMAL HIGH (ref 70–99)
Glucose, Bld: 245 mg/dL — ABNORMAL HIGH (ref 70–99)
Potassium: 3.9 mmol/L (ref 3.5–5.1)
Potassium: 3 mmol/L — ABNORMAL LOW (ref 3.5–5.1)
SODIUM: 143 mmol/L (ref 135–145)
Sodium: 138 mmol/L (ref 135–145)

## 2014-10-01 LAB — CK
CK TOTAL: 27272 U/L — AB (ref 7–177)
Total CK: >50000 U/L — ABNORMAL HIGH (ref 7–177)

## 2014-10-01 LAB — CBC
HEMATOCRIT: 38.5 % (ref 36.0–46.0)
Hemoglobin: 12.8 g/dL (ref 12.0–15.0)
MCH: 31.2 pg (ref 26.0–34.0)
MCHC: 33.2 g/dL (ref 30.0–36.0)
MCV: 93.9 fL (ref 78.0–100.0)
Platelets: 236 10*3/uL (ref 150–400)
RBC: 4.1 MIL/uL (ref 3.87–5.11)
RDW: 14.4 % (ref 11.5–15.5)
WBC: 9.7 10*3/uL (ref 4.0–10.5)

## 2014-10-01 LAB — CK TOTAL AND CKMB (NOT AT ARMC): Total CK: 49873 U/L — ABNORMAL HIGH (ref 7–177)

## 2014-10-01 LAB — GLUCOSE, CAPILLARY
Glucose-Capillary: 109 mg/dL — ABNORMAL HIGH (ref 70–99)
Glucose-Capillary: 116 mg/dL — ABNORMAL HIGH (ref 70–99)
Glucose-Capillary: 85 mg/dL (ref 70–99)

## 2014-10-01 LAB — LACTIC ACID, PLASMA
LACTIC ACID, VENOUS: 2.4 mmol/L — AB (ref 0.5–2.0)
LACTIC ACID, VENOUS: 3.1 mmol/L — AB (ref 0.5–2.0)

## 2014-10-01 MED ORDER — SILVER SULFADIAZINE 1 % EX CREA
TOPICAL_CREAM | Freq: Every day | CUTANEOUS | Status: DC
  Administered 2014-10-01: 1 via TOPICAL
  Administered 2014-10-02 – 2014-10-08 (×7): via TOPICAL
  Filled 2014-10-01: qty 85

## 2014-10-01 MED ORDER — CETYLPYRIDINIUM CHLORIDE 0.05 % MT LIQD
7.0000 mL | Freq: Two times a day (BID) | OROMUCOSAL | Status: DC
  Administered 2014-10-01: 7 mL via OROMUCOSAL

## 2014-10-01 MED ORDER — SODIUM CHLORIDE 0.9 % IV BOLUS (SEPSIS)
1000.0000 mL | Freq: Once | INTRAVENOUS | Status: DC
  Administered 2014-10-01: 1000 mL via INTRAVENOUS

## 2014-10-01 MED ORDER — SODIUM CHLORIDE 0.9 % IV SOLN
1.0000 g | Freq: Once | INTRAVENOUS | Status: AC
  Administered 2014-10-01: 1 g via INTRAVENOUS
  Filled 2014-10-01: qty 10

## 2014-10-01 MED ORDER — FENTANYL CITRATE (PF) 100 MCG/2ML IJ SOLN
50.0000 ug | INTRAMUSCULAR | Status: DC | PRN
  Administered 2014-10-01 – 2014-10-02 (×9): 50 ug via INTRAVENOUS
  Administered 2014-10-03: 100 ug via INTRAVENOUS
  Administered 2014-10-03 – 2014-10-04 (×4): 50 ug via INTRAVENOUS
  Filled 2014-10-01 (×14): qty 2

## 2014-10-01 MED ORDER — POTASSIUM CHLORIDE 10 MEQ/100ML IV SOLN
10.0000 meq | INTRAVENOUS | Status: AC
  Administered 2014-10-02 (×4): 10 meq via INTRAVENOUS
  Filled 2014-10-01 (×4): qty 100

## 2014-10-01 MED ORDER — DEXTROSE 5 % IV SOLN
1.0000 g | Freq: Three times a day (TID) | INTRAVENOUS | Status: DC
  Administered 2014-10-01 – 2014-10-04 (×9): 1 g via INTRAVENOUS
  Filled 2014-10-01 (×11): qty 1

## 2014-10-01 MED ORDER — POTASSIUM CHLORIDE CRYS ER 20 MEQ PO TBCR
40.0000 meq | EXTENDED_RELEASE_TABLET | Freq: Two times a day (BID) | ORAL | Status: DC
  Filled 2014-10-01: qty 2

## 2014-10-01 MED ORDER — CETYLPYRIDINIUM CHLORIDE 0.05 % MT LIQD
7.0000 mL | OROMUCOSAL | Status: DC | PRN
Start: 2014-10-01 — End: 2014-10-02

## 2014-10-01 NOTE — Procedures (Signed)
Extubation Procedure Note  Patient Details:   Name: Charlotte Strong DOB: [birth year] MRN: [id number]   Airway Documentation:     Evaluation  O2 sats: stable throughout Complications: No apparent complications Patient did tolerate procedure well. Bilateral Breath Sounds: Clear, Diminished Suctioning: Airway Yes   Pt extubated per MD order.  Pt placed on 4l [place] with sats of 94%. RN at bedside.  RT will continue to monitor.  Closson, Terie PurserMorgan Kennetha Pearman 10/01/2014, 8:30 AM

## 2014-10-01 NOTE — Progress Notes (Signed)
UR Completed.  [phone]  

## 2014-10-01 NOTE — Progress Notes (Addendum)
SUBJECTIVE RN approached us for increasing oxygen requirement. At bedside, patient was verbalizing appropriately but was on non-rebreather with O2 sats 100%. CXR ordered with worsening pulmonary edema as compared to yesterday AM.  OBJECTIVE Filed Vitals:   10/01/14 2000  BP: 136/82  Pulse: 119  Temp: 99.3 F (37.4 C)  Resp: 21   General: thin Caucasian female, wearing non-rebreather Cardiac: tachycardic, no rubs, murmurs or gallops Pulm: crackles in the anterior lung fields Ext: warm and well perfused, no pedal or tibial edema Neuro: alert and oriented X3  ASSESSMENT Suspect worsening pulmonary edema given the amount of IV fluids she has received. No prior cardiac history to suggest heart failure.  PLAN -Discontinue IV fluids for now given otherwise reassuring labwork [except for mildly elevated lactate] -Give Kdur 40mEq BID for low K -Consider Lasix 20mg  IV should she have worsening respiratory distress though will hold off for now -Give silvadene cream for skin lesions

## 2014-10-01 NOTE — Clinical Social Work Note (Signed)
CSW Consult Acknowledged:   CSW received a consult for substance abuse. CSW spoke with RN bedside nurse, given the pt current inability to focus/concentrate CSW could not complete a full substance about assessment. CSW will continue to follow the pt.   Raini Tiley, MSW, LCSWA [phone]

## 2014-10-01 NOTE — Progress Notes (Signed)
Wakeup assessment: Patient able to follow commands with full sedation in place. RASS 2 with max fentanyl drip at 400.  Patient requires bedside sitter for frequent re-orientation and prevention of interference with medical devices. Will discuss with MD on rounds this morning. Will continue to monitor closely.

## 2014-10-01 NOTE — Progress Notes (Signed)
CRITICAL VALUE ALERT  Critical value received:  Calcium: 6.2 Lactic Acid: 3.1  Date of notification:  9/21  Time of notification:  2015  Critical value read back:Yes.    Nurse who received alert:  Hazel Samshristian Smith  MD notified (1st page):  Pola CornElink MD  Time of first page:  2045  Responding MD:  Dr. Arsenio LoaderSommer  Time MD responded:  2045

## 2014-10-01 NOTE — Consult Note (Signed)
Adequate perfusion both feet.  Extubation in progress.  Follow neuro vascular exam clinically Will sign off Call if questions or change in condition  Fabienne Brunsharles Ninoshka Wainwright, MD [facility] [place] Office: [phone] Pager: [phone]

## 2014-10-01 NOTE — Progress Notes (Signed)
PULMONARY / CRITICAL CARE MEDICINE   Name: Kieren Ricci MRN: [id number] DOB: 18-Jan-1976    ADMISSION DATE:  09/30/2014  REFERRING MD :  EDP  CHIEF COMPLAINT:  AMS, respiratory failure   INITIAL PRESENTATION: 39 yo female with hx drug abuse, asthma presented 4/20 after being found unresponsive at home with presumed OD left down for at least 12 hours. Patient found to have rhabdomyolosis, respiratory failure requiring intubation, and reperfusion injury.  STUDIES:  CT head/ c-spine 4/20>> neg acute  CXR 4/21>> NG tube tip projects over the upper thorax and should be repositioned/advanced. Left greater than right airspace opacities may reflect pneumonia and/or aspiration.  SIGNIFICANT EVENTS: 4/20>> Intubated and admitted to MICU 4/21>> Extubated  LINES: OETT 4/20>>4/21 OGT 4/20>>4/21  ABX: Aztreonam 4/20>> Levaquin 4/20>>4/21 (d/c due to prolonged QTc) Vanc 4/20>>  SUBJECTIVE: Intubated, agitated  VITAL SIGNS: Temp:  [90.1 F (32.3 C)-100.7 F (38.2 C)] 97.6 F (36.4 C) (04/21 1300) Pulse Rate:  [83-128] 116 (04/21 1300) Resp:  [13-34] 16 (04/21 1300) BP: (57-123)/(41-93) 123/59 mmHg (04/21 1300) SpO2:  [84 %-100 %] 93 % (04/21 1300) FiO2 (%):  [40 %-100 %] 40 % (04/21 0600) Weight:  [114 lb 10.2 oz (51.999 kg)-136 lb 3.9 oz (61.8 kg)] 136 lb 3.9 oz (61.8 kg) (04/21 0422)  VENTILATOR SETTINGS: OFF VENT  INTAKE / OUTPUT:  Intake/Output Summary (Last 24 hours) at 10/01/14 1357 Last data filed at 10/01/14 1200  Gross per 24 hour  Intake 10299.01 ml  Output   2635 ml  Net 7664.01 ml   PHYSICAL EXAMINATION: General:  Young female, agitated, awake Neuro:  Awake RASS +2  HEENT:  ETT, OGT>> later extubated and OGT out Cardiovascular: Tachycardic, regular rhythm Lungs: Rhonchorus, no wheezes or rales Abdomen:  Soft, non-tender, non-distended Musculoskeletal:  Normal appearing musculature, soft, some pain in LLE, 1+ pedal pulses bilaterally  LABS:  CBC  Recent  Labs Lab 09/30/14 1415 09/30/14 1906 10/01/14 0421  WBC 19.1* 12.6* 9.7  HGB 14.1 13.7 12.8  HCT 44.7 41.1 38.5  PLT 340 244 236   BMET  Recent Labs Lab 09/30/14 1906 09/30/14 2120 10/01/14 0421  NA 139 138 143  K 5.3* 5.5* 3.9  CL 113* 111 111  CO2 16* 16* 22  BUN 29* 26* 23  CREATININE 1.78* 1.63* 1.46*  GLUCOSE 165* 149* 106*   Electrolytes  Recent Labs Lab 09/30/14 1906 09/30/14 2120 10/01/14 0421  CALCIUM 6.2* 6.2* 6.5*   Sepsis Markers  Recent Labs Lab 09/30/14 1906 09/30/14 2041 10/01/14 0421  LATICACIDVEN 3.1* 3.5* 2.4*  PROCALCITON 15.32  --   --    ABG  Recent Labs Lab 09/30/14 1652  PHART 7.038*  PCO2ART 65.6*  PO2ART 329.0*   Liver Enzymes  Recent Labs Lab 09/30/14 1415 09/30/14 1906  AST 174* 307*  ALT 85* 109*  ALKPHOS 111 86  BILITOT 0.8 0.8  ALBUMIN 3.7 2.6*   Glucose  Recent Labs Lab 09/30/14 1540 09/30/14 1744 09/30/14 2013 10/01/14 0030 10/01/14 0444 10/01/14 0800  GLUCAP 169* 193* 158* 116* 85 109*   Imaging Ct Head Wo Contrast  09/30/2014   CLINICAL DATA:  39 year old female with altered mental status with possible drug overdose.  EXAM: CT HEAD WITHOUT CONTRAST  CT CERVICAL SPINE WITHOUT CONTRAST  TECHNIQUE: Multidetector CT imaging of the head and cervical spine was performed following the standard protocol without intravenous contrast. Multiplanar CT image reconstructions of the cervical spine were also generated.  COMPARISON:  Prior CT scan of the  head and cervical spine 06/20/2011  FINDINGS: CT HEAD FINDINGS  Insert tonight via perhaps mild soft tissue contusion left temporal scalp just above the ear. No underlying calvarial injury. Mastoid air cells and paranasal sinuses are well aerated. Orbits and globes are intact and symmetric bilaterally.  CT CERVICAL SPINE FINDINGS  No acute fracture, malalignment or prevertebral soft tissue swelling. Unremarkable CT appearance of the thyroid gland. No acute soft tissue  abnormality. Biapical pleural parenchymal scarring. The patient is intubated and a nasogastric tube is present. Both tubes are incompletely imaged.  IMPRESSION: CT HEAD  1. Negative CT CSPINE  1. Negative   Electronically Signed   By: Malachy MoanHeath  McCullough M.D.   On: 09/30/2014 15:33   Ct Cervical Spine Wo Contrast  09/30/2014   CLINICAL DATA:  39 year old female with altered mental status with possible drug overdose.  EXAM: CT HEAD WITHOUT CONTRAST  CT CERVICAL SPINE WITHOUT CONTRAST  TECHNIQUE: Multidetector CT imaging of the head and cervical spine was performed following the standard protocol without intravenous contrast. Multiplanar CT image reconstructions of the cervical spine were also generated.  COMPARISON:  Prior CT scan of the head and cervical spine 06/20/2011  FINDINGS: CT HEAD FINDINGS  Insert tonight via perhaps mild soft tissue contusion left temporal scalp just above the ear. No underlying calvarial injury. Mastoid air cells and paranasal sinuses are well aerated. Orbits and globes are intact and symmetric bilaterally.  CT CERVICAL SPINE FINDINGS  No acute fracture, malalignment or prevertebral soft tissue swelling. Unremarkable CT appearance of the thyroid gland. No acute soft tissue abnormality. Biapical pleural parenchymal scarring. The patient is intubated and a nasogastric tube is present. Both tubes are incompletely imaged.  IMPRESSION: CT HEAD  1. Negative CT CSPINE  1. Negative   Electronically Signed   By: Malachy MoanHeath  McCullough M.D.   On: 09/30/2014 15:33   Dg Chest Port 1 View  09/30/2014   CLINICAL DATA:  Or dental status.  EXAM: PORTABLE CHEST - 1 VIEW  COMPARISON:  09/27/2013 head CT scan dated 10/07/2013  FINDINGS: Endotracheal tube and OG tube within inserted in appear in good position. Is a left perihilar infiltrate extending into the upper lobe and a faint patchy infiltrate at the left lung base. There is also suggestion of infiltrate/ atelectasis in the right upper lobe medially.  Right mid and lower lung zones are clear. Heart size and vascularity are normal. No osseous abnormality.  IMPRESSION: Bilateral infiltrates,/ atelectasis, mainly on the left. The possibility of aspiration pneumonitis should be considered .   Electronically Signed   By: Francene BoyersJames  Maxwell M.D.   On: 09/30/2014 14:50   ASSESSMENT / PLAN:  PULMONARY OETT 4/20>>4/21 Acute respiratory failure - in setting OD  Aspiration PNA  P:   Empiric abx as below  Albuterol Q2H prn    CARDIOVASCULAR Hypotension  Hyperkalemia  P:  Lactic Acid Aggressive volume resuscitation  Telemetry  RENAL AKI: 2.9>1.4  Hyperkalemia - resolved Rhabdomyolysis CK 6000>27000>50000 Metabolic Acidosis  P:   Aggressive volume resuscitation on NS and Bicarb gtt in D5W Repeat BMET tomorrow am Ionized Calcium tomorrow am Trend CK  GASTROINTESTINAL No active issue P:   Ice Chips ADAT tonight Protonix 40 mg IV QD  HEMATOLOGIC Leukocytosis- resolving 19>12>9 P:  Heparin SQ TID Repeat CBC tomorrow am  INFECTIOUS UTI  Aspiration PNA  P:   BCx2 4/20>>>NGTD UCx 4/20>>> Sputum Cx 4/20>>> Levaquin 4/20>>4/21 Azactam 4/20>> Vancomycin x1 4/20>> Repeat CBC tomorrow am  ENDOCRINE Mild Hyperglycemia No known  hx DM    P:   Monitor glucose on chem Add SSI if >180  NEUROLOGIC CT head negative for acute abnormalities Drug Overdose: UDS POS benzos, opiates, THC P:   Versed Q2H prn Consider fentanyl prn for pain  ORTHO:  Reperfusion Injury- improving P:  Vascular surgery signed off  Ortho-no surgical intervention Clinically improved  FAMILY  - Updated Family 4/20  Jill Alexanders, DO PGY-1 Internal Medicine Resident Pager # [phone] 10/01/2014 1:57 PM

## 2014-10-01 NOTE — Progress Notes (Signed)
Pt remains agitated and noncooperative. Rebounds from sedation to agitation and back. Pt grabbing and pulling ETT despite mittens and wrist restraints. Agitation minimally improved with titration up of Fentanyl and q2* PRN Versed

## 2014-10-01 NOTE — Progress Notes (Signed)
Discussed blisters on pt arm and foot with resident.

## 2014-10-01 NOTE — Progress Notes (Signed)
eLink Physician-Brief Progress Note Patient Name: Charlotte GailsCrystal Strong DOB: [birth year] MRN: [id number]   Date of Service  10/01/2014  HPI/Events of Note  Ca++ = 6.2, albumin = 2.6 and Ca++ corrected for albumin = 7.32. Lactic Acid 2.4 --> 3.1. Urine output has been good.   eICU Interventions  Will order: 1. Replete Ca++. 2. 0.9 NaCl 1 liter over 1 hour now.      Intervention Category Intermediate Interventions: Electrolyte abnormality - evaluation and management;Diagnostic test evaluation  Lenell AntuSommer,Jasdeep Dejarnett Eugene 10/01/2014, 8:44 PM
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
## 2014-10-02 ENCOUNTER — Inpatient Hospital Stay (HOSPITAL_COMMUNITY): Payer: Medicaid Other

## 2014-10-02 DIAGNOSIS — G9340 Encephalopathy, unspecified: Secondary | ICD-10-CM | POA: Insufficient documentation

## 2014-10-02 DIAGNOSIS — J690 Pneumonitis due to inhalation of food and vomit: Secondary | ICD-10-CM | POA: Insufficient documentation

## 2014-10-02 LAB — BASIC METABOLIC PANEL
ANION GAP: 9 (ref 5–15)
BUN: 8 mg/dL (ref 6–23)
CO2: 28 mmol/L (ref 19–32)
Calcium: 7.5 mg/dL — ABNORMAL LOW (ref 8.4–10.5)
Chloride: 105 mmol/L (ref 96–112)
Creatinine, Ser: 0.99 mg/dL (ref 0.50–1.10)
GFR calc Af Amer: 84 mL/min — ABNORMAL LOW (ref >90)
GFR calc non Af Amer: 72 mL/min — ABNORMAL LOW (ref >90)
Glucose, Bld: 100 mg/dL — ABNORMAL HIGH (ref 70–99)
POTASSIUM: 3.7 mmol/L (ref 3.5–5.1)
SODIUM: 142 mmol/L (ref 135–145)

## 2014-10-02 LAB — CK
CK TOTAL: 34018 U/L — AB (ref 7–177)
Total CK: 24220 U/L — ABNORMAL HIGH (ref 7–177)

## 2014-10-02 LAB — CBC
HCT: 36.7 % (ref 36.0–46.0)
HEMOGLOBIN: 12.4 g/dL (ref 12.0–15.0)
MCH: 31.6 pg (ref 26.0–34.0)
MCHC: 33.8 g/dL (ref 30.0–36.0)
MCV: 93.6 fL (ref 78.0–100.0)
Platelets: 185 10*3/uL (ref 150–400)
RBC: 3.92 MIL/uL (ref 3.87–5.11)
RDW: 14.6 % (ref 11.5–15.5)
WBC: 10.4 10*3/uL (ref 4.0–10.5)

## 2014-10-02 MED ORDER — VANCOMYCIN HCL IN DEXTROSE 750-5 MG/150ML-% IV SOLN
750.0000 mg | Freq: Two times a day (BID) | INTRAVENOUS | Status: DC
  Administered 2014-10-02 (×2): 750 mg via INTRAVENOUS
  Filled 2014-10-02 (×4): qty 150

## 2014-10-02 MED ORDER — SODIUM CHLORIDE 0.9 % IV SOLN
INTRAVENOUS | Status: DC
  Administered 2014-10-02 – 2014-10-07 (×4): via INTRAVENOUS
  Administered 2014-10-07: 100 mL/h via INTRAVENOUS

## 2014-10-02 MED ORDER — WHITE PETROLATUM GEL
Status: AC
  Administered 2014-10-02: 1
  Filled 2014-10-02: qty 1

## 2014-10-02 MED ORDER — CETYLPYRIDINIUM CHLORIDE 0.05 % MT LIQD
7.0000 mL | Freq: Two times a day (BID) | OROMUCOSAL | Status: DC
  Administered 2014-10-02 – 2014-10-08 (×9): 7 mL via OROMUCOSAL

## 2014-10-02 MED ORDER — DEXTROSE 5 % IV SOLN
INTRAVENOUS | Status: DC
  Administered 2014-10-02 (×2): via INTRAVENOUS
  Filled 2014-10-02 (×5): qty 150
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
## 2014-10-02 NOTE — Progress Notes (Signed)
[facility] changed to travel down [facility]. Pt condition prohibitive to traveling off of unit with out RN. [place] denies that they have and RN available to watch patient. Discussed with AD Amanda Peaarla Fullk. RN also has another unstable patient of high acutiy. US notified and will make a note on it in patients chart.

## 2014-10-02 NOTE — Progress Notes (Signed)
PULMONARY / CRITICAL CARE MEDICINE   Name: Charlotte Strong MRN: [id number] DOB: February 27, 1976    ADMISSION DATE:  09/30/2014  REFERRING MD :  EDP  CHIEF COMPLAINT:  AMS, respiratory failure   BRIEF DESCRIPTION: 39 yo female with pmhx of drug abuse and asthma who was admitted for drug overdose on benzos and opioids and found to have rhabdomyolosis, respiratory failure requiring intubation, and reperfusion injury.  STUDIES:  CT head/ c-spine 4/20>> neg acute  CXR 4/21>> NG tube tip projects over the upper thorax and should be repositioned/advanced. Left greater than right airspace opacities may reflect pneumonia and/or aspiration. L Ankle Xray 4/22>> Left Femur Xray 4/22>> Left Hip Xray 4/22 Left Knee Xray 4/22>>  SIGNIFICANT EVENTS: 4/20>> Intubated and admitted to MICU 4/21>> Extubated  LINES: OETT 4/20>>4/21 OGT 4/20>>4/21  ABX: Aztreonam 4/20>> Levaquin 4/20>>4/21 (d/c due to prolonged QTc) Vanc 4/20>>  SUBJECTIVE: Patient complains of continued pain on left side specifically in her left foot, knee, hip and wrist. She is unable to move her left toes due to the pain. She admits to productive cough, but denies fever.   VITAL SIGNS: Temp:  [97.6 F (36.4 C)-100.8 F (38.2 C)] 100.2 F (37.9 C) (04/22 1000) Pulse Rate:  [109-133] 117 (04/22 1000) Resp:  [15-25] 25 (04/22 1000) BP: (103-136)/(46-91) 117/63 mmHg (04/22 1000) SpO2:  [90 %-99 %] 93 % (04/22 1000) Weight:  [145 lb 8.1 oz (66 kg)] 145 lb 8.1 oz (66 kg) (04/22 0354)  VENTILATOR SETTINGS: OFF VENT  INTAKE / OUTPUT:  Intake/Output Summary (Last 24 hours) at 10/02/14 1131 Last data filed at 10/02/14 1000  Gross per 24 hour  Intake   3010 ml  Output   3565 ml  Net   -555 ml   PHYSICAL EXAMINATION: General:  Young female, awake, mild distress Neuro:  Awake, moves all extremities Cardiovascular: Tachycardic, regular rhythm Lungs: Diffusely rhonchorus, no wheezes or rales Abdomen:  Soft, non-tender,  non-distended Musculoskeletal:  Normal appearing musculature with some bruising, soft,  LLE tender to palpation worst over left foot and knee. 2+ pedal pulses on right, decreased pedal pulses on left. Left hand and wrist edematous and tender to palpation with blistering on skin.   LABS:  CBC  Recent Labs Lab 09/30/14 1906 10/01/14 0421 10/02/14 0533  WBC 12.6* 9.7 10.4  HGB 13.7 12.8 12.4  HCT 41.1 38.5 36.7  PLT 244 236 185   BMET  Recent Labs Lab 10/01/14 0421 10/01/14 1840 10/02/14 0533  NA 143 138 142  K 3.9 3.0* 3.7  CL 111 99 105  CO2 22 32 28  BUN CREATININE 1.46* 1.01 0.99  GLUCOSE 106* 245* 100*   Electrolytes  Recent Labs Lab 10/01/14 0421 10/01/14 1840 10/02/14 0533  CALCIUM 6.5* 6.2* 7.5*   Sepsis Markers  Recent Labs Lab 09/30/14 1906 09/30/14 2041 10/01/14 0421 10/01/14 1840  LATICACIDVEN 3.1* 3.5* 2.4* 3.1*  PROCALCITON 15.32  --   --   --    ABG  Recent Labs Lab 09/30/14 1652  PHART 7.038*  PCO2ART 65.6*  PO2ART 329.0*   Liver Enzymes  Recent Labs Lab 09/30/14 1415 09/30/14 1906  AST 174* 307*  ALT 85* 109*  ALKPHOS 111 86  BILITOT 0.8 0.8  ALBUMIN 3.7 2.6*   Glucose  Recent Labs Lab 09/30/14 1540 09/30/14 1744 09/30/14 2013 10/01/14 0030 10/01/14 0444 10/01/14 0800  GLUCAP 169* 193* 158* 116* 85 109*   Imaging Dg Chest Port 1 View  10/01/2014  CLINICAL DATA:  Dyspnea and worsening shortness of breath.  EXAM: PORTABLE CHEST - 1 VIEW  COMPARISON:  10/01/2014  FINDINGS: Multifocal airspace opacities, increased and most pronounced left perihilar and retrocardiac, right upper lobe/ apex. No overt effusion. No pneumothorax. Cardiomediastinal contours within normal range. Interval extubation. No acute osseous finding. Surgical clips right upper quadrant.  IMPRESSION: Increased bilateral airspace opacities, may reflect multifocal pneumonia and/or edema.  Recommend short-term radiograph follow-up to document  resolution.   Electronically Signed   By: Jearld LeschAndrew  DelGaizo M.D.   On: 10/01/2014 21:29   Dg Chest Port 1 View  10/01/2014   CLINICAL DATA:  Aspiration pneumonia, shortness of breath, intubated.  EXAM: PORTABLE CHEST - 1 VIEW  COMPARISON:  09/30/2014  FINDINGS: Endotracheal tube tip projects 3.3 cm proximal to the carina. NG tube tip projects over the upper thorax and may be within the proximal/mid esophagus. Multifocal airspace opacities, most pronounced within the left lower lung. No pleural effusion or pneumothorax. No acute osseous finding.  IMPRESSION: NG tube tip projects over the upper thorax and should be repositioned/advanced. Discussed via telephone with the patient's nurse, Roe Coombson, at 6:08 a.m. on 10/01/2014.  Left greater than right airspace opacities may reflect pneumonia and/or aspiration.   Electronically Signed   By: Jearld LeschAndrew  DelGaizo M.D.   On: 10/01/2014 06:11   ASSESSMENT / PLAN:  PULMONARY OETT 4/20>>4/21 Acute respiratory failure in setting OD- improved Aspiration PNA requiring 6 L O2 Pulmonary Edema on CXR, more consistent with aspiration PNA P:   Empiric abx as below  Albuterol Q2H prn   Continue IVF as below  CARDIOVASCULAR Hypotension-resolved P:  Aggressive volume resuscitation as below Telemetry  RENAL AKI: 2.9>1.4>0.99, improving  Rhabdomyolysis: CK >50000 Metabolic Acidosis  P:   Aggressive volume resuscitation on NS 100 cc/hr and Bicarb gtt at 100 cc/hr Repeat BMET tomorrow am Trend CK BID Any concern for pulmonary edema-continue fluids, patient has good urine output  GASTROINTESTINAL Elevated Liver Enzymes P:   Repeat CMET tomorrow am  If AST/ALT continue to be elevated, consider checking hepatitis panel Regular diet  HEMATOLOGIC Leukocytosis 2/2 Aspiration PNA- resolving 19>12>9>10.4  P:  Heparin SQ TID  INFECTIOUS E. Coli UTI  Aspiration PNA  P:   BCx2 4/20>>>NGTD UCx 4/20>>>E. Coli Levaquin 4/20>>4/21 Azactam 4/20>> Vancomycin  4/20>> Consider d/c Vanc tomorrow  ENDOCRINE No active issues  P:   Monitor   NEUROLOGIC Drug Overdose: UDS POS benzos, opiates, THC P:   Fentanyl 50-100 mcg Q2H prn  ORTHO:  Reperfusion Injury: Patient continues to have severe pain in left lower extremity and left wrist.  P:  Discussed with orthopedic surgery and hand surgery. Per Dr. Melvyn Novasrtmann, doubt xray will show anything recommends US to assess for fluid collection, doubt surgical intervention, but will assess patient. Per Dr. Lajoyce Cornersuda, pain and decreased movement all secondary to continued muscle injury and nerve damage from compression, doubt xray will show anything, recommends PT/OT with PFO for foot drop, aggressive IVF and protein nutrition.  CK continues to climb L wrist, hip, knee, and foot xray US of left wrist PT/OT  FAMILY  - Updated Family 4/21  Jill AlexandersAlexa Richardson, DO PGY-1 Internal Medicine Resident Pager # [phone] 10/02/2014 11:31 AM

## 2014-10-02 NOTE — Clinical Documentation Improvement (Signed)
Pt admitted with altered mental status  Please clarify if conditon can be further specified as any below.  . Document the etiology of the altered mental status as: --Coma --Confusion/delirium (including drug-induced) --Drowsiness/somnolence --Stupor/semi-coma --Transient alteration of awareness --Encephalopathy Alcoholic Anoxic/hypoxic Drug-induced/toxic (specify drug) Hepatic Hypertensive Hypoglycemic Metabolic/septic  Other (specify) . Document any associated diagnoses/conditions  Supporting Information: 10/01/14: Nursing reports"agitated and noncooperative" Critical care: 10/01/14 agitated during PE Drug Screen results 09/30/14 Thank You,  Elpidio AnisGarnet Sheila Ocasio, RN, BSN, CDi [phone]
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
## 2014-10-02 NOTE — Procedures (Signed)
Pt awake and oriented x4 in Good cheer and spirits. Still c/o pain 10/10 on RLE and RUE  IV Fentanyl for Pain. Will continue to assess.

## 2014-10-03 LAB — COMPREHENSIVE METABOLIC PANEL
ALT: 227 U/L — AB (ref 0–35)
AST: 571 U/L — AB (ref 0–37)
Albumin: 2 g/dL — ABNORMAL LOW (ref 3.5–5.2)
Alkaline Phosphatase: 81 U/L (ref 39–117)
Anion gap: 7 (ref 5–15)
BUN: <5 mg/dL — ABNORMAL LOW (ref 6–23)
CALCIUM: 7.5 mg/dL — AB (ref 8.4–10.5)
CHLORIDE: 102 mmol/L (ref 96–112)
CO2: 30 mmol/L (ref 19–32)
Creatinine, Ser: 0.79 mg/dL (ref 0.50–1.10)
GFR calc Af Amer: >90 mL/min (ref >90)
GFR calc non Af Amer: >90 mL/min (ref >90)
Glucose, Bld: 148 mg/dL — ABNORMAL HIGH (ref 70–99)
Potassium: 2.7 mmol/L — CL (ref 3.5–5.1)
Sodium: 139 mmol/L (ref 135–145)
Total Bilirubin: 0.7 mg/dL (ref 0.3–1.2)
Total Protein: 4.5 g/dL — ABNORMAL LOW (ref 6.0–8.3)

## 2014-10-03 LAB — URINE CULTURE

## 2014-10-03 LAB — LACTIC ACID, PLASMA: Lactic Acid, Venous: 1.6 mmol/L (ref 0.5–2.0)

## 2014-10-03 LAB — PROTIME-INR
INR: 1.38 (ref 0.00–1.49)
PROTHROMBIN TIME: 17.1 s — AB (ref 11.6–15.2)

## 2014-10-03 LAB — CK TOTAL AND CKMB (NOT AT ARMC)
CK TOTAL: 12410 U/L — AB (ref 7–177)
CK, MB: 17 ng/mL — AB (ref 0.3–4.0)
Relative Index: 0.1 (ref 0.0–2.5)

## 2014-10-03 LAB — BRAIN NATRIURETIC PEPTIDE: B NATRIURETIC PEPTIDE 5: 310.1 pg/mL — AB (ref 0.0–100.0)

## 2014-10-03 LAB — PROCALCITONIN: Procalcitonin: 1.4 ng/mL

## 2014-10-03 LAB — CALCIUM, IONIZED: Calcium, Ionized, Serum: 4.3 mg/dL — ABNORMAL LOW (ref 4.5–5.6)

## 2014-10-03 MED ORDER — POTASSIUM CHLORIDE CRYS ER 20 MEQ PO TBCR: 40.0000 meq | EXTENDED_RELEASE_TABLET | Freq: Two times a day (BID) | ORAL | Status: DC

## 2014-10-03 MED ORDER — BUDESONIDE 0.25 MG/2ML IN SUSP
0.2500 mg | Freq: Two times a day (BID) | RESPIRATORY_TRACT | Status: DC
  Administered 2014-10-03 – 2014-10-07 (×8): 0.25 mg via RESPIRATORY_TRACT
  Filled 2014-10-03 (×12): qty 2

## 2014-10-03 MED ORDER — NALOXONE HCL 0.4 MG/ML IJ SOLN: 0.4000 mg | INTRAMUSCULAR | Status: DC | PRN

## 2014-10-03 MED ORDER — SODIUM CHLORIDE 0.9 % IJ SOLN: 9.0000 mL | INTRAMUSCULAR | Status: DC | PRN

## 2014-10-03 MED ORDER — BUDESONIDE 0.25 MG/2ML IN SUSP: 0.2500 mg | Freq: Two times a day (BID) | RESPIRATORY_TRACT | Status: DC

## 2014-10-03 MED ORDER — POTASSIUM CHLORIDE CRYS ER 20 MEQ PO TBCR
40.0000 meq | EXTENDED_RELEASE_TABLET | Freq: Two times a day (BID) | ORAL | Status: AC
  Administered 2014-10-03 (×2): 40 meq via ORAL
  Filled 2014-10-03 (×2): qty 2

## 2014-10-03 MED ORDER — IPRATROPIUM-ALBUTEROL 0.5-2.5 (3) MG/3ML IN SOLN
3.0000 mL | RESPIRATORY_TRACT | Status: DC
  Administered 2014-10-03 – 2014-10-05 (×11): 3 mL via RESPIRATORY_TRACT
  Filled 2014-10-03 (×12): qty 3

## 2014-10-03 MED ORDER — HYDROMORPHONE 0.3 MG/ML IV SOLN
INTRAVENOUS | Status: DC
  Administered 2014-10-03: 1.5 mg via INTRAVENOUS
  Administered 2014-10-03: 22:00:00 via INTRAVENOUS
  Administered 2014-10-03: 0.3 mg via INTRAVENOUS
  Administered 2014-10-03: 2.1 mg via INTRAVENOUS
  Administered 2014-10-04: 1.5 mg via INTRAVENOUS
  Administered 2014-10-04: 1.2 mg via INTRAVENOUS
  Administered 2014-10-04: 2.7 mg via INTRAVENOUS
  Administered 2014-10-04: 21:00:00 via INTRAVENOUS
  Administered 2014-10-04: 0.6 mg via INTRAVENOUS
  Administered 2014-10-05: 0.3 mg via INTRAVENOUS
  Administered 2014-10-05: 2.8 mg via INTRAVENOUS
  Administered 2014-10-05: 3.9 mg via INTRAVENOUS
  Administered 2014-10-05: 6.9 mg via INTRAVENOUS
  Administered 2014-10-06: 3 mg via INTRAVENOUS
  Administered 2014-10-06: via INTRAVENOUS
  Administered 2014-10-06: 2.88 mg via INTRAVENOUS
  Administered 2014-10-06: 3 mg via INTRAVENOUS
  Administered 2014-10-06: 2.7 mg via INTRAVENOUS
  Administered 2014-10-06: 0.9 mg via INTRAVENOUS
  Administered 2014-10-06 – 2014-10-07 (×3): via INTRAVENOUS
  Administered 2014-10-07: 2.4 mg via INTRAVENOUS
  Administered 2014-10-07: 0.3 mg via INTRAVENOUS
  Administered 2014-10-07: 4.5 mg via INTRAVENOUS
  Administered 2014-10-07: 02:00:00 via INTRAVENOUS
  Filled 2014-10-03 (×11): qty 25

## 2014-10-03 MED ORDER — ONDANSETRON HCL 4 MG/2ML IJ SOLN: 4.0000 mg | Freq: Four times a day (QID) | INTRAMUSCULAR | Status: DC | PRN

## 2014-10-03 NOTE — Evaluation (Signed)
Physical Therapy Evaluation Patient Details Name: Charlotte GailsCrystal Strong MRN: [id number] DOB: Aug 28, 1975 Today's Date: 10/03/2014   History of Present Illness  Adm 4/20 s/p OD (found on floor after ?48 hours) with rhabdomyolysis, renal failure, intubated 4/20-4/21; Lt UE and LE nerve palsies due to pressure with blistering of Lt foot; all xrays negative for fracture PMHx-drug use; depression  Clinical Impression  Pt admitted with above diagnosis. Pt currently severely limited in mobility due to Lt foot drop, blisters to Lt foot, and non-functional Lt hand. Pt currently with functional limitations due to the deficits listed below (see PT Problem List).  Pt will benefit from skilled PT to increase their independence and safety with mobility to allow discharge to the venue listed below.       Follow Up Recommendations CIR;Supervision/Assistance - 24 hour    Equipment Recommendations   (Lt platform RW)    Recommendations for Other Services Rehab consult;OT consult     Precautions / Restrictions Precautions Precautions: Fall Restrictions Weight Bearing Restrictions: No      Mobility  Bed Mobility Overal bed mobility: Needs Assistance Bed Mobility: Supine to Sit     Supine to sit: Min assist;HOB elevated     General bed mobility comments: exit to Rt; assist to scoot to EOB once sitting'  Transfers Overall transfer level: Needs assistance Equipment used: 1 person hand held assist Transfers: Sit to/from [employer];Stand Pivot Transfers Sit to Stand: Min guard Stand pivot transfers: Min assist       General transfer comment: pt able to stand from ICU bed with inguard assist; tolerating little weight-bearing on Lt foot; pivot to her Rt on RLE using armrests of chair with min asssit  Ambulation/Gait             General Gait Details: likely will need Lt platform RW  Stairs            Wheelchair Mobility    Modified Rankin (Stroke Patients Only)       Balance                                              Pertinent Vitals/Pain Pain Assessment: 0-10 Pain Score: 5  Pain Location: LLE and Lt wrist Pain Descriptors / Indicators: Aching Pain Intervention(s): Limited activity within patient's tolerance;Monitored during session;Premedicated before session;Repositioned;PCA encouraged    Home Living Family/patient expects to be discharged to:: Private residence Living Arrangements: Parent Available Help at Discharge: Family;Available PRN/intermittently (mother works 8a-2p) Type of Home: Mobile home Home Access: Stairs to enter Entrance Stairs-Rails: None Entrance Stairs-Number of Steps: 1 (large) Home Layout: One level Home Equipment: Grab bars - tub/shower (walkin shower)      Prior Function Level of Independence: Independent         Comments: her children are 16 & 17     Hand Dominance        Extremity/Trunk Assessment   Upper Extremity Assessment: Defer to OT evaluation (educated re: elevation; AROM; AAROm)           Lower Extremity Assessment: LLE deficits/detail   LLE Deficits / Details: hip, knee WNL; ankle DF 0/5 (does have slight 1st toe movement); passive DF to neutral; +blisters Lt foot (from pressure)  Cervical / Trunk Assessment: Normal  Communication   Communication: No difficulties  Cognition Arousal/Alertness: Awake/alert Behavior During Therapy: WFL for tasks assessed/performed Overall Cognitive Status: Within  Functional Limits for tasks assessed                      General Comments General comments (skin integrity, edema, etc.): mother present    Exercises General Exercises - Lower Extremity Ankle Circles/Pumps: AROM;PROM;Right;Left;5 reps Long Arc Quad: AROM;Both;10 reps;Seated Heel Slides: AROM;Both;5 reps      Assessment/Plan    PT Assessment Patient needs continued PT services  PT Diagnosis Difficulty walking;Acute pain   PT Problem List Decreased range of motion;Decreased  activity tolerance;Decreased balance;Decreased mobility;Decreased knowledge of use of DME;Decreased safety awareness;Decreased knowledge of precautions;Pain  PT Treatment Interventions DME instruction;Gait training;Stair training;Functional mobility training;Therapeutic activities;Therapeutic exercise;Patient/family education   PT Goals (Current goals can be found in the Care Plan section) Acute Rehab PT Goals Patient Stated Goal: be able to walk  PT Goal Formulation: With patient Time For Goal Achievement: 10/10/14 Potential to Achieve Goals: Good    Frequency Min 4X/week   Barriers to discharge Decreased caregiver support      Co-evaluation               End of Session Equipment Utilized During Treatment: Oxygen Activity Tolerance: Patient tolerated treatment well Patient left: in chair;with call bell/phone within reach;with family/visitor present Nurse Communication: Mobility status         Time: 1610-9604 PT Time Calculation (min) (ACUTE ONLY): 28 min   Charges:   PT Evaluation $Initial PT Evaluation Tier I: 1 Procedure PT Treatments $Therapeutic Activity: 8-22 mins   PT G Codes:        Keldrick Pomplun 10-21-14, 5:00 PM Pager [phone]

## 2014-10-03 NOTE — Progress Notes (Signed)
PULMONARY / CRITICAL CARE MEDICINE   Name: Charlotte Strong MRN: [id number] DOB: [birth year]    ADMISSION DATE:  09/30/2014  REFERRING MD :  EDP  CHIEF COMPLAINT:  AMS, respiratory failure   BRIEF DESCRIPTION: 39 yo female with pmhx of drug abuse and asthma who was admitted for drug overdose on benzos and opioids and found to have rhabdomyolosis, respiratory failure requiring intubation, and reperfusion injury.  LINES: OETT 4/20>>4/21 OGT 4/20>>4/21  ABX: Aztreonam 4/20>> Levaquin 4/20>>4/21 (d/c due to prolonged QTc) Vanc 4/20>>   STUDIES:  CT head/ c-spine 4/20>> neg acute  CXR 4/21>> NG tube tip projects over the upper thorax and should be repositioned/advanced. Left greater than right airspace opacities may reflect pneumonia and/or aspiration. L Ankle Xray 4/22>> Left Femur Xray 4/22>> Left Hip Xray 4/22 Left Knee Xray 4/22>>  SIGNIFICANT EVENTS: 4/20>> Intubated and admitted to MICU 4/21>> Extubated 10/02/14:  Patient complains of continued pain on left side specifically in her left foot, knee, hip and wrist. She is unable to move her left toes due to the pain. She admits to productive cough, but denies fever.     SUBJECTIVE/OVERNIGHT/INTERVAL HX 10/03/14: CK trending down yesterday. HAnd Cx yesterday - continued monitoring for partial thickness burn. Has wheezing. Has bicarb gtt. Has severe pain in hand and foot  VITAL SIGNS: Temp:  [99.6 F (37.6 C)-102 F (38.9 C)] 100.5 F (38.1 C) (04/23 0600) Pulse Rate:  [99-122] 99 (04/23 0600) Resp:  [16-28] 23 (04/23 0600) BP: (100-130)/(54-84) 120/62 mmHg (04/23 0600) SpO2:  [93 %-100 %] 99 % (04/23 0600) Weight:  [64.7 kg (142 lb 10.2 oz)] 64.7 kg (142 lb 10.2 oz) (04/23 0453)  VENTILATOR SETTINGS: OFF VENT  INTAKE / OUTPUT:  Intake/Output Summary (Last 24 hours) at 10/03/14 0825 Last data filed at 10/03/14 0600  Gross per 24 hour  Intake   5700 ml  Output   5350 ml  Net    350 ml   PHYSICAL  EXAMINATION: General:  Young female, awake, mild distress Neuro:  Awake, moves all extremities Cardiovascular: Tachycardic, regular rhythm Lungs: Diffusely rhonchorus, no wheezes or rales Abdomen:  Soft, non-tender, non-distended Musculoskeletal:  Normal appearing musculature with some bruising, soft,  LLE tender to palpation worst over left foot and knee. 2+ pedal pulses on right, decreased pedal pulses on left. Left hand and wrist edematous and tender to palpation with blistering on skin. -> RN says worse x 24h  LABS: PULMONARY  Recent Labs Lab 09/30/14 1652  PHART 7.038*  PCO2ART 65.6*  PO2ART 329.0*  HCO3 17.7*  TCO2 20  O2SAT 100.0    CBC  Recent Labs Lab 09/30/14 1906 10/01/14 0421 10/02/14 0533  HGB 13.7 12.8 12.4  HCT 41.1 38.5 36.7  WBC 12.6* 9.7 10.4  PLT 244 236 185    COAGULATION No results for input(s): INR in the last 168 hours.  CARDIAC  No results for input(s): TROPONINI in the last 168 hours. No results for input(s): PROBNP in the last 168 hours.   CHEMISTRY  Recent Labs Lab 09/30/14 2120 10/01/14 0421 10/01/14 1840 10/02/14 0533 10/03/14 0241  NA 138 143 138 142 139  K 5.5* 3.9 3.0* 3.7 2.7*  CL 111 111 99 105 102  CO2 16* 22 32 28 30  GLUCOSE 149* 106* 245* 100* 148*  BUN 26* <5*  CREATININE 1.63* 1.46* 1.01 0.99 0.79  CALCIUM 6.2* 6.5* 6.2* 7.5* 7.5*   Estimated Creatinine Clearance: 91 mL/min (by C-G formula based on  Cr of 0.79).   LIVER  Recent Labs Lab 09/30/14 1415 09/30/14 1906 10/03/14 0241  AST 174* 307* 571*  ALT 85* 109* 227*  ALKPHOS 111 86 81  BILITOT 0.8 0.8 0.7  PROT 6.6 4.9* 4.5*  ALBUMIN 3.7 2.6* 2.0*     INFECTIOUS  Recent Labs Lab 09/30/14 1906 09/30/14 2041 10/01/14 0421 10/01/14 1840  LATICACIDVEN 3.1* 3.5* 2.4* 3.1*  PROCALCITON 15.32  --   --   --      ENDOCRINE CBG (last 3)   Recent Labs  10/01/14 0030 10/01/14 0444 10/01/14 0800  GLUCAP 116* 85 109*          IMAGING x48h Dg Pelvis 1-2 Views  10/02/2014   CLINICAL DATA:  Acute onset left hip pain radiating into the left leg. No known injury.  EXAM: PELVIS - 1-2 VIEW  COMPARISON:  Left femur x-rays obtained concurrently. Bone window images from CT abdomen and pelvis 10/06/2009.  FINDINGS: No evidence of acute fracture. Hip joints intact with symmetric well preserved joint spaces. Calcification adjacent to the left acetabular rim superiorly. Sacroiliac joints and symphysis pubis intact without evidence of diastasis or significant degenerative change. Visualized lower lumbar spine intact. Well preserved bone mineral density. No intrinsic osseous abnormality.  IMPRESSION: 1. No acute or subacute osseous abnormalities involving the pelvis. 2. Symmetric normal-appearing joint spaces in both hips. 3. Calcification adjacent to the superior left acetabular rim. Acetabular rim calcifications can be due to labral calcification/ossification, acetabular rim stress fractures, or os acetabuli. Os acetabuli and acetabular rim stress fractures put the patient at increased risk for pincer type femoroacetabular impingement.   Electronically Signed   By: Hulan Saas M.D.   On: 10/02/2014 20:46   Dg Wrist Complete Left  10/02/2014   CLINICAL DATA:  Acute onset of left wrist pain and swelling. No known injury.  EXAM: LEFT WRIST - COMPLETE 3+ VIEW  COMPARISON:  None.  FINDINGS: No evidence of acute, subacute or healed fractures. Well preserved joint spaces. Well preserved bone mineral density. Faint calcification involving the triangular fibrocartilage complex.  IMPRESSION: No acute or subacute osseous abnormality. CPPD involving the triangular fibrocartilage complex.   Electronically Signed   By: Hulan Saas M.D.   On: 10/02/2014 20:44   Dg Ankle Complete Left  10/02/2014   CLINICAL DATA:  Acute onset left ankle pain and swelling. No known injury.  EXAM: LEFT ANKLE COMPLETE - 3+ VIEW  COMPARISON:  None.   FINDINGS: No evidence of acute fracture. Ankle mortise intact with well-preserved joint space. Well-preserved bone mineral density. No intrinsic osseous abnormalities. No visible joint effusion.  IMPRESSION: Normal examination.   Electronically Signed   By: Hulan Saas M.D.   On: 10/02/2014 20:46   [place] Extrem Up Left Ltd  10/02/2014   CLINICAL DATA:  Edema in the left hand and wrist.  EXAM: ULTRASOUND OF THE LEFT HAND AND WRIST  LIMITED  TECHNIQUE: Ultrasound examination of the upper extremity soft tissues was performed in the area of clinical concern.  COMPARISON:  Right hand and wrist was used for comparison.  FINDINGS: There is subcutaneous edema without a definable abscess. There is also edema around the tendons in the distal forearm. No definable abscess.  IMPRESSION: Subcutaneous edema as well as edema in the soft tissues around the tendons in the distal forearm consistent with cellulitis and tenosynovitis. No defined abscesses.   Electronically Signed   By: Francene Boyers M.D.   On: 10/02/2014 18:54   Dg Chest  Port 1 View  10/01/2014   CLINICAL DATA:  Dyspnea and worsening shortness of breath.  EXAM: PORTABLE CHEST - 1 VIEW  COMPARISON:  10/01/2014  FINDINGS: Multifocal airspace opacities, increased and most pronounced left perihilar and retrocardiac, right upper lobe/ apex. No overt effusion. No pneumothorax. Cardiomediastinal contours within normal range. Interval extubation. No acute osseous finding. Surgical clips right upper quadrant.  IMPRESSION: Increased bilateral airspace opacities, may reflect multifocal pneumonia and/or edema.  Recommend short-term radiograph follow-up to document resolution.   Electronically Signed   By: Jearld Lesch M.D.   On: 10/01/2014 21:29   Dg Femur Min 2 Views Left  10/02/2014   CLINICAL DATA:  Acute onset left hip pain radiating into the left leg. No known injuries.  EXAM: LEFT FEMUR 2 VIEWS  COMPARISON:  None.  FINDINGS: No evidence of acute, subacute or  healed fractures. Well preserved bone mineral density. No intrinsic osseous abnormality involving the femur. Visualized hip joint intact though there is a small calcification adjacent to the acetabular rim superiorly. Visualized knee joint intact.  IMPRESSION: 1. Normal-appearing left femur. 2. Small calcification adjacent to the superior acetabular rim. Acetabular rim calcifications can be due to labral calcification/ossification, acetabular rim stress fractures, or os acetabuli. Os acetabuli and acetabular rim stress fractures put the patient at increased risk for pincer type femoroacetabular impingement.   Electronically Signed   By: Hulan Saas M.D.   On: 10/02/2014 20:42        ASSESSMENT / PLAN:  PULMONARY OETT 4/20>>4/21 Acute respiratory failure in setting OD- improved Aspiration PNA requiring 6 L O2 Pulmonary Edema on CXR, more consistent with aspiration PNA    - remains extubated. On 5L with significant wheeze.  +6L since admit 09/30/2014   P:   Empiric abx as below  Continue IVF as below - check bnp /echo/ck and decide on cutting down fluids and lasix  CARDIOVASCULAR Hypotension-resolved   - +6L since admit  P:  Dc bicarb fluids Aggressive volume resuscitation as below Telemetry  RENAL AKI: 2.9>1.4>0.99, improving  Rhabdomyolysis: CK >50000 Metabolic Acidosis    - CK imprived to 20s K 10/02/14 +6L since admit P:   Aggressive volume resuscitation on NS 100 cc/hr  DC Bicarb gtt at 100 cc/hr Check Lactate stat Repeat BMET tomorrow am Trend CK daily Hold off diuresis till CK sginificantly better   GASTROINTESTINAL Elevated Liver Enzymes   - rising LFT 10/03/2014  P:   Check INR and Repeat CMET tomorrow am ; If AST/ALT continue to be elevated, consider checking hepatitis panel Regular diet  HEMATOLOGIC Leukocytosis 2/2 Aspiration PNA- resolving 19>12>9>10.4  P:  Heparin SQ TID  INFECTIOUS   BCx2 4/20>>>NGTD UCx 4/20>>>E. Coli  E. Coli UTI  with severe spsis and Aspiration PNA  P:   Levaquin 4/20>>4/21 Azactam 4/20>> Vancomycin 4/20>>4/23 Consider d/c Vanc tomorrow  ENDOCRINE No active issues  P:   Monitor   NEUROLOGIC Drug Overdose: UDS POS benzos, opiates, THC  Having severe pain at local site in hand and feet  P:   Fentanyl 50-100 mcg Q2H prn Start dilaudid pca  ORTHO:  Reperfusion Injury: Patient continues to have severe pain in left lower extremity and left wrist.   - 10/02/14 resident notes: with orthopedic surgery and hand surgery. Per Dr. Melvyn Novas, doubt xray will show anything recommends [place] cellulits and tenosynovitis with edema - doubt surgical intervention. Per Dr. Lajoyce Corners, pain and decreased movement all secondary to continued muscle injury and nerve damage from compression, doubt xray  will show anything, recommends PT/OT with PFO for foot drop, aggressive IVF and protein nutrition.    - 10/03/14: PerRN pain and swelling worse in left foot and hand  PLAN  - recheck CK (Was improving) - RN to call hand surgery again 4/23/16s  - PT/OT  FAMILY  - Updated Family 4/21   GLOBAL resp status stable. Start dilaudid pca. Worsening hand and foot edema - recheck CK, RN to call Ortho   The patient is critically ill with multiple organ systems failure and requires high complexity decision making for assessment and support, frequent evaluation and titration of therapies, application of advanced monitoring technologies and extensive interpretation of multiple databases.   Critical Care Time devoted to patient care services described in this note is  35  Minutes. This time reflects time of care of this signee Dr Kalman ShanMurali Antario Yasuda. This critical care time does not reflect procedure time, or teaching time or supervisory time of PA/NP/Med student/Med Resident etc but could involve care discussion time    Dr. Kalman ShanMurali Chonita Gadea, M.D., [facility].P Pulmonary and Critical Care Medicine Staff Physician [facility]   Pulmonary and Critical Care Pager: [phone], If no answer or between  15:00h - 7:00h: call [phone]  10/03/2014 8:25 AM

## 2014-10-04 DIAGNOSIS — R52 Pain, unspecified: Secondary | ICD-10-CM

## 2014-10-04 DIAGNOSIS — R0600 Dyspnea, unspecified: Secondary | ICD-10-CM

## 2014-10-04 LAB — CBC WITH DIFFERENTIAL/PLATELET
Basophils Absolute: 0 10*3/uL (ref 0.0–0.1)
Basophils Relative: 0 % (ref 0–1)
Eosinophils Absolute: 0.1 10*3/uL (ref 0.0–0.7)
Eosinophils Relative: 1 % (ref 0–5)
HCT: 29.8 % — ABNORMAL LOW (ref 36.0–46.0)
Hemoglobin: 10.1 g/dL — ABNORMAL LOW (ref 12.0–15.0)
LYMPHS PCT: 12 % (ref 12–46)
Lymphs Abs: 1.1 10*3/uL (ref 0.7–4.0)
MCH: 31.8 pg (ref 26.0–34.0)
MCHC: 34.6 g/dL (ref 30.0–36.0)
MCV: 92 fL (ref 78.0–100.0)
Monocytes Absolute: 0.7 10*3/uL (ref 0.1–1.0)
Monocytes Relative: 8 % (ref 3–12)
Neutro Abs: 7.1 10*3/uL (ref 1.7–7.7)
Neutrophils Relative %: 79 % — ABNORMAL HIGH (ref 43–77)
PLATELETS: 158 10*3/uL (ref 150–400)
RBC: 3.24 MIL/uL — AB (ref 3.87–5.11)
RDW: 14.4 % (ref 11.5–15.5)
WBC: 9.1 10*3/uL (ref 4.0–10.5)

## 2014-10-04 LAB — HEPATIC FUNCTION PANEL
ALT: 214 U/L — AB (ref 0–35)
AST: 362 U/L — ABNORMAL HIGH (ref 0–37)
Albumin: 1.9 g/dL — ABNORMAL LOW (ref 3.5–5.2)
Alkaline Phosphatase: 89 U/L (ref 39–117)
BILIRUBIN DIRECT: 0.2 mg/dL (ref 0.0–0.5)
BILIRUBIN INDIRECT: 0.4 mg/dL (ref 0.3–0.9)
TOTAL PROTEIN: 4.8 g/dL — AB (ref 6.0–8.3)
Total Bilirubin: 0.6 mg/dL (ref 0.3–1.2)

## 2014-10-04 LAB — CK TOTAL AND CKMB (NOT AT ARMC)
CK TOTAL: 7152 U/L — AB (ref 7–177)
CK, MB: 8.8 ng/mL (ref 0.3–4.0)
RELATIVE INDEX: 0.1 (ref 0.0–2.5)

## 2014-10-04 LAB — BASIC METABOLIC PANEL
ANION GAP: 9 (ref 5–15)
BUN: <5 mg/dL — ABNORMAL LOW (ref 6–23)
CHLORIDE: 104 mmol/L (ref 96–112)
CO2: 24 mmol/L (ref 19–32)
CREATININE: 0.72 mg/dL (ref 0.50–1.10)
Calcium: 8 mg/dL — ABNORMAL LOW (ref 8.4–10.5)
GFR calc non Af Amer: >90 mL/min (ref >90)
GLUCOSE: 148 mg/dL — AB (ref 70–99)
Potassium: 2.8 mmol/L — ABNORMAL LOW (ref 3.5–5.1)
Sodium: 137 mmol/L (ref 135–145)

## 2014-10-04 LAB — MAGNESIUM: Magnesium: 1.5 mg/dL (ref 1.5–2.5)

## 2014-10-04 LAB — PROCALCITONIN: PROCALCITONIN: 0.97 ng/mL

## 2014-10-04 LAB — PHOSPHORUS: Phosphorus: 1.4 mg/dL — ABNORMAL LOW (ref 2.3–4.6)

## 2014-10-04 MED ORDER — CEFUROXIME AXETIL 500 MG PO TABS
500.0000 mg | ORAL_TABLET | Freq: Two times a day (BID) | ORAL | Status: DC
  Administered 2014-10-04 – 2014-10-08 (×8): 500 mg via ORAL
  Filled 2014-10-04 (×10): qty 1

## 2014-10-04 MED ORDER — SODIUM CHLORIDE 0.9 % IJ SOLN
10.0000 mL | INTRAMUSCULAR | Status: DC | PRN
  Administered 2014-10-05: 20 mL
  Administered 2014-10-06 – 2014-10-08 (×7): 10 mL
  Filled 2014-10-04 (×8): qty 40

## 2014-10-04 MED ORDER — ALPRAZOLAM 0.25 MG PO TABS
0.2500 mg | ORAL_TABLET | Freq: Two times a day (BID) | ORAL | Status: DC | PRN
  Administered 2014-10-04 – 2014-10-08 (×6): 0.25 mg via ORAL
  Filled 2014-10-04 (×7): qty 1

## 2014-10-04 MED ORDER — IBUPROFEN 200 MG PO TABS
600.0000 mg | ORAL_TABLET | Freq: Four times a day (QID) | ORAL | Status: DC | PRN
  Administered 2014-10-04 – 2014-10-05 (×2): 600 mg via ORAL
  Filled 2014-10-04 (×3): qty 3

## 2014-10-04 MED ORDER — POTASSIUM CHLORIDE CRYS ER 20 MEQ PO TBCR
40.0000 meq | EXTENDED_RELEASE_TABLET | Freq: Two times a day (BID) | ORAL | Status: AC
  Administered 2014-10-04 (×2): 40 meq via ORAL
  Filled 2014-10-04 (×2): qty 2

## 2014-10-04 MED ORDER — GUAIFENESIN-CODEINE 100-10 MG/5ML PO SOLN
5.0000 mL | Freq: Four times a day (QID) | ORAL | Status: DC | PRN
Start: 2014-10-04 — End: 2014-10-08
  Administered 2014-10-08: 5 mL via ORAL
  Filled 2014-10-04 (×2): qty 5

## 2014-10-04 MED ORDER — MAGNESIUM SULFATE 4 GM/100ML IV SOLN
4.0000 g | Freq: Once | INTRAVENOUS | Status: AC
  Administered 2014-10-04: 4 g via INTRAVENOUS
  Filled 2014-10-04: qty 100

## 2014-10-04 MED ORDER — K PHOS MONO-SOD PHOS DI & MONO 155-852-130 MG PO TABS
500.0000 mg | ORAL_TABLET | Freq: Three times a day (TID) | ORAL | Status: DC
  Administered 2014-10-04 – 2014-10-08 (×18): 500 mg via ORAL
  Filled 2014-10-04 (×20): qty 2

## 2014-10-04 MED ORDER — POTASSIUM PHOSPHATE MONOBASIC 500 MG PO TABS
500.0000 mg | ORAL_TABLET | Freq: Three times a day (TID) | ORAL | Status: DC
  Filled 2014-10-04 (×4): qty 1

## 2014-10-04 NOTE — Progress Notes (Signed)
Report given to RN Robin 5w room 12

## 2014-10-04 NOTE — Progress Notes (Signed)
10/04/14 Patient being transferring from 58M to Room  5W14 with Dx of AMS.Diet regular,IV site 4/20 Rt Hand with NS 50 ml, VTE Heparin,Foley in place,has PCA pump with Dilaudid. Hx of Respiratory Failure, Acute Encephalopathy,Rhabomyolysis, and pain.

## 2014-10-04 NOTE — Progress Notes (Signed)
  Echocardiogram 2D Echocardiogram has been performed.  Charlotte Strong FRANCES 10/04/2014, 11:07 AM

## 2014-10-04 NOTE — Progress Notes (Signed)
PULMONARY / CRITICAL CARE MEDICINE   Name: Charlotte Strong MRN: [id number] DOB: 03/23/1976    ADMISSION DATE:  09/30/2014  REFERRING MD :  EDP  CHIEF COMPLAINT:  AMS, respiratory failure   BRIEF DESCRIPTION: 39 yo female with pmhx of drug abuse and asthma who was admitted for drug overdose on benzos and opioids and found to have rhabdomyolosis, respiratory failure requiring intubation, and reperfusion injury. Rhabdomyolysis improving, but still with significant pain from reperfusion injury.   LINES: OETT 4/20>>4/21 OGT 4/20>>4/21 Foley 4/20>>  ABX: Aztreonam 4/20>> Levaquin 4/20>>4/21 (d/c due to prolonged QTc) Vanc 4/20>>4/23  STUDIES:  CT head/ c-spine 4/20>> neg acute  CXR 4/21>> NG tube tip projects over the upper thorax and should be repositioned/advanced. Left greater than right airspace opacities may reflect pneumonia and/or aspiration. L Ankle Xray 4/22>> Normal Left Femur Xray 4/22>> Normal Left Hip Xray 4/22>> No acute or subacute osseous abnormalities involving the pelvis. Symmetric normal-appearing joint spaces in both hips. Calcification adjacent to the superior left acetabular rim. Acetabular rim calcifications can be due to labral calcification/ossification, acetabular rim stress fractures, or os acetabuli. Os acetabuli and acetabular rim stress fractures put the patient at increased risk for pincer type femoro-acetabular impingement. [place] Left Wrist 4/22>>Subcutaneous edema as well as edema in the soft tissues around the tendons in the distal forearm consistent with cellulitis and tenosynovitis. No defined abscesses.  Left Wrist Xray 4/22>> CPPD involving the triangular fibrocartilage complex.  SIGNIFICANT EVENTS: 4/20>> Intubated and admitted to MICU 4/21>> Extubated 10/02/14:  Patient complains of continued pain on left side specifically in her left foot, knee, hip and wrist. She is unable to move her left toes due to the pain. She admits to productive cough, but  denies fever.   SUBJECTIVE: Continues to have severe pain on left side in leg and and left wrist. Exam improved, able to move fingers and toes.   VITAL SIGNS: Temp:  [98.2 F (36.8 C)-101.5 F (38.6 C)] 100.8 F (38.2 C) (04/24 0700) Pulse Rate:  [104-128] 128 (04/24 0347) Resp:  [14-28] 21 (04/24 0700) BP: (96-121)/(49-81) 118/81 mmHg (04/24 0700) SpO2:  [94 %-100 %] 99 % (04/24 0600) FiO2 (%):  [97 %] 97 % (04/23 1200) Weight:  [145 lb 1 oz (65.8 kg)] 145 lb 1 oz (65.8 kg) (04/24 0400)  VENTILATOR SETTINGS: OFF VENT  INTAKE / OUTPUT:  Intake/Output Summary (Last 24 hours) at 10/04/14 0741 Last data filed at 10/04/14 0600  Gross per 24 hour  Intake   4530 ml  Output   6215 ml  Net  -1685 ml   PHYSICAL EXAMINATION: General:  Young female, awake, mild distress Neuro:  Awake, moves all extremities Cardiovascular: Tachycardic, regular rhythm Lungs: Diffusely rhonchorus (improved), no wheezes or rales Abdomen:  Soft, non-tender, non-distended Musculoskeletal: LLE soft but tender to palpation worst over left foot and left wrist. 2+ pedal pulses on right, 1+pedal pulses on left. Left hand and wrist edematous and tender to palpation with blistering on skin.   LABS: PULMONARY  Recent Labs Lab 09/30/14 1652  PHART 7.038*  PCO2ART 65.6*  PO2ART 329.0*  HCO3 17.7*  TCO2 20  O2SAT 100.0   CBC  Recent Labs Lab 10/01/14 0421 10/02/14 0533 10/04/14 0230  HGB 12.8 12.4 10.1*  HCT 38.5 36.7 29.8*  WBC 9.7 10.4 9.1  PLT 236 185 158    COAGULATION  Recent Labs Lab 10/03/14 0844  INR 1.38   CARDIAC No results for input(s): TROPONINI in the last 168 hours. No  results for input(s): PROBNP in the last 168 hours.  CHEMISTRY  Recent Labs Lab 09/30/14 2120 10/01/14 0421 10/01/14 1840 10/02/14 0533 10/03/14 0241  NA 138 143 138 142 139  K 5.5* 3.9 3.0* 3.7 2.7*  CL 111 111 99 105 102  CO2 16* 22 32 28 30  GLUCOSE 149* 106* 245* 100* 148*  BUN 26* 23 11 8   <5*  CREATININE 1.63* 1.46* 1.01 0.99 0.79  CALCIUM 6.2* 6.5* 6.2* 7.5* 7.5*   Estimated Creatinine Clearance: 91 mL/min (by C-G formula based on Cr of 0.79).  LIVER  Recent Labs Lab 09/30/14 1415 09/30/14 1906 10/03/14 0241 10/03/14 0844  AST 174* 307* 571*  --   ALT 85* 109* 227*  --   ALKPHOS 111 86 81  --   BILITOT 0.8 0.8 0.7  --   PROT 6.6 4.9* 4.5*  --   ALBUMIN 3.7 2.6* 2.0*  --   INR  --   --   --  1.38   INFECTIOUS  Recent Labs Lab 09/30/14 1906  10/01/14 0421 10/01/14 1840 10/03/14 0844  LATICACIDVEN 3.1*  < > 2.4* 3.1* 1.6  PROCALCITON 15.32  --   --   --  1.40  < > = values in this interval not displayed.  ENDOCRINE CBG (last 3)   Recent Labs  10/01/14 0800  GLUCAP 109*   IMAGING x48h Dg Pelvis 1-2 Views  10/02/2014   CLINICAL DATA:  Acute onset left hip pain radiating into the left leg. No known injury.  EXAM: PELVIS - 1-2 VIEW  COMPARISON:  Left femur x-rays obtained concurrently. Bone window images from CT abdomen and pelvis 10/06/2009.  FINDINGS: No evidence of acute fracture. Hip joints intact with symmetric well preserved joint spaces. Calcification adjacent to the left acetabular rim superiorly. Sacroiliac joints and symphysis pubis intact without evidence of diastasis or significant degenerative change. Visualized lower lumbar spine intact. Well preserved bone mineral density. No intrinsic osseous abnormality.  IMPRESSION: 1. No acute or subacute osseous abnormalities involving the pelvis. 2. Symmetric normal-appearing joint spaces in both hips. 3. Calcification adjacent to the superior left acetabular rim. Acetabular rim calcifications can be due to labral calcification/ossification, acetabular rim stress fractures, or os acetabuli. Os acetabuli and acetabular rim stress fractures put the patient at increased risk for pincer type femoroacetabular impingement.   Electronically Signed   By: Hulan Saashomas  Lawrence M.D.   On: 10/02/2014 20:46   Dg Wrist  Complete Left  10/02/2014   CLINICAL DATA:  Acute onset of left wrist pain and swelling. No known injury.  EXAM: LEFT WRIST - COMPLETE 3+ VIEW  COMPARISON:  None.  FINDINGS: No evidence of acute, subacute or healed fractures. Well preserved joint spaces. Well preserved bone mineral density. Faint calcification involving the triangular fibrocartilage complex.  IMPRESSION: No acute or subacute osseous abnormality. CPPD involving the triangular fibrocartilage complex.   Electronically Signed   By: Hulan Saashomas  Lawrence M.D.   On: 10/02/2014 20:44   Dg Ankle Complete Left  10/02/2014   CLINICAL DATA:  Acute onset left ankle pain and swelling. No known injury.  EXAM: LEFT ANKLE COMPLETE - 3+ VIEW  COMPARISON:  None.  FINDINGS: No evidence of acute fracture. Ankle mortise intact with well-preserved joint space. Well-preserved bone mineral density. No intrinsic osseous abnormalities. No visible joint effusion.  IMPRESSION: Normal examination.   Electronically Signed   By: Hulan Saashomas  Lawrence M.D.   On: 10/02/2014 20:46   [place] Extrem Up Left Ltd  10/02/2014  CLINICAL DATA:  Edema in the left hand and wrist.  EXAM: ULTRASOUND OF THE LEFT HAND AND WRIST  LIMITED  TECHNIQUE: Ultrasound examination of the upper extremity soft tissues was performed in the area of clinical concern.  COMPARISON:  Right hand and wrist was used for comparison.  FINDINGS: There is subcutaneous edema without a definable abscess. There is also edema around the tendons in the distal forearm. No definable abscess.  IMPRESSION: Subcutaneous edema as well as edema in the soft tissues around the tendons in the distal forearm consistent with cellulitis and tenosynovitis. No defined abscesses.   Electronically Signed   By: Francene Boyers M.D.   On: 10/02/2014 18:54   Dg Femur Min 2 Views Left  10/02/2014   CLINICAL DATA:  Acute onset left hip pain radiating into the left leg. No known injuries.  EXAM: LEFT FEMUR 2 VIEWS  COMPARISON:  None.  FINDINGS: No  evidence of acute, subacute or healed fractures. Well preserved bone mineral density. No intrinsic osseous abnormality involving the femur. Visualized hip joint intact though there is a small calcification adjacent to the acetabular rim superiorly. Visualized knee joint intact.  IMPRESSION: 1. Normal-appearing left femur. 2. Small calcification adjacent to the superior acetabular rim. Acetabular rim calcifications can be due to labral calcification/ossification, acetabular rim stress fractures, or os acetabuli. Os acetabuli and acetabular rim stress fractures put the patient at increased risk for pincer type femoroacetabular impingement.   Electronically Signed   By: Hulan Saas M.D.   On: 10/02/2014 20:42   ASSESSMENT / PLAN:  PULMONARY OETT 4/20>>4/21 Acute respiratory failure in setting OD- improved Aspiration PNA requiring 4 L O2 P:   Empiric abx as below  Duonebs Q4H prn Pulmicort BID Albuterol Q2H prn  CARDIOVASCULAR Hypotension-resolved +5L since admission Elevated BNP at 310.1 P:  Volume resuscitation as below Telemetry Consider cutting back on IVF versus diuresis when appropriate Echo pending  RENAL AKI: Resolved Rhabdomyolysis: Improving, CK trending down Metabolic Acidosis  Hypokalemia 2.8 P:   Aggressive volume resuscitation on NS 100 cc/hr  Repeat BMET tomorrow am Trend CK daily Hold off diuresis till CK sginificantly better KDur 40 mEq x 2  GASTROINTESTINAL Elevated Liver Enzymes P:   Hepatitis panel in process Regular diet  HEMATOLOGIC Leukocytosis 2/2 Aspiration PNA- Resolved P:  Heparin SQ TID  INFECTIOUS BCx2 4/20>>>NGTD UCx 4/20>>>E. Coli E. Coli UTI with severe sepsis and Aspiration PNA  [place] L Hand with cellulitis and tenosynovitis P:   Levaquin 4/20>>4/21 Azactam 4/20>> Vancomycin 4/20>>4/23  ENDOCRINE No active issues  P:   Monitor   NEUROLOGIC Drug Overdose: UDS POS benzos, opiates, THC Having severe pain at local site in hand  and feet not well controlled with Dilaudid and Fentanyl Itching, likely 2/2 to Dilaudid P:   Fentanyl 50-100 mcg Q2H prn Dilaudid pca Narcan prn  ORTHO:  Reperfusion Injury: Patient continues to have severe pain in left lower extremity and left wrist.  [place] cellulitis and tenosynovitis with edema Radial nerve palsy P: PT/OT with PFO for foot drop IVF and protein nutrition Silvadene cream Wound care Trend CK Follow up hand surgery as outpatient OT consult for orthosis  FAMILY  - Updated Family 4/21  CLINICAL SUMMARY: 39 yo female with pmhx of drug abuse and asthma who was admitted for drug overdose on benzos and opioids and found to have rhabdomyolosis, respiratory failure requiring intubation, and reperfusion injury. Extubated 4/21 to [place], rhabdomyolysis improving, but still with significant pain from reperfusion injury.  Jill Alexanders, DO PGY-1 Internal Medicine Resident Pager # [phone] 10/04/2014 7:41 AM

## 2014-10-04 NOTE — Progress Notes (Signed)
Dr Senaida Oresichardson ordered to leave foley in. Md also aware of elevated temperature.

## 2014-10-04 NOTE — Progress Notes (Signed)
Peripherally Inserted Central Catheter/Midline Placement  The IV Nurse has discussed with the patient and/or persons authorized to consent for the patient, the purpose of this procedure and the potential benefits and risks involved with this procedure.  The benefits include less needle sticks, lab draws from the catheter and patient may be discharged home with the catheter.  Risks include, but not limited to, infection, bleeding, blood clot (thrombus formation), and puncture of an artery; nerve damage and irregular heat beat.  Alternatives to this procedure were also discussed.  PICC/Midline Placement Documentation  PICC / Midline Double Lumen 10/04/14 PICC Right Basilic 39 cm 0 cm (Active)  Indication for Insertion or Continuance of Line Prolonged intravenous therapies;Poor Vasculature-patient has had multiple peripheral attempts or PIVs lasting less than 24 hours 10/04/2014  3:11 PM  Exposed Catheter (cm) 0 cm 10/04/2014  3:11 PM  Site Assessment Clean;Dry;Intact 10/04/2014  3:11 PM  Lumen #1 Status Flushed;Saline locked;Blood return noted 10/04/2014  3:11 PM  Lumen #2 Status Flushed;Saline locked;Blood return noted 10/04/2014  3:11 PM  Dressing Type Transparent 10/04/2014  3:11 PM  Dressing Status Clean;Dry;Intact;Antimicrobial disc in place 10/04/2014  3:11 PM  Line Care Connections checked and tightened 10/04/2014  3:11 PM  Line Adjustment (NICU/IV Team Only) No 10/04/2014  3:11 PM  Dressing Intervention New dressing 10/04/2014  3:11 PM  Dressing Change Due 10/11/14 10/04/2014  3:11 PM       Elliot Dallyiggs, Leilanee Righetti Wright 10/04/2014, 3:12 PM

## 2014-10-04 NOTE — Progress Notes (Signed)
Orthopedic Tech Progress Note Patient Details:  Renato GailsCrystal Goodgame 02/05/1976 [id number] Patient to have OT consult to determine what brace is needed Patient ID: Renato Gailsrystal Kamau, female   DOB: 02/05/1976, 39 y.o.   MRN: [id number]   Orie Routsia R Thompson 10/04/2014, 3:26 PM

## 2014-10-05 LAB — HEPATITIS PANEL, ACUTE
HCV AB: REACTIVE — AB
HEP B C IGM: NONREACTIVE
HEP B S AG: NEGATIVE
Hep A IgM: NONREACTIVE

## 2014-10-05 LAB — CBC WITH DIFFERENTIAL/PLATELET
Basophils Absolute: 0 10*3/uL (ref 0.0–0.1)
Basophils Relative: 0 % (ref 0–1)
EOS PCT: 3 % (ref 0–5)
Eosinophils Absolute: 0.3 10*3/uL (ref 0.0–0.7)
HCT: 31.7 % — ABNORMAL LOW (ref 36.0–46.0)
Hemoglobin: 10.7 g/dL — ABNORMAL LOW (ref 12.0–15.0)
LYMPHS ABS: 1 10*3/uL (ref 0.7–4.0)
LYMPHS PCT: 9 % — AB (ref 12–46)
MCH: 31 pg (ref 26.0–34.0)
MCHC: 33.8 g/dL (ref 30.0–36.0)
MCV: 91.9 fL (ref 78.0–100.0)
MONO ABS: 0.9 10*3/uL (ref 0.1–1.0)
MONOS PCT: 8 % (ref 3–12)
NEUTROS PCT: 80 % — AB (ref 43–77)
Neutro Abs: 8.7 10*3/uL — ABNORMAL HIGH (ref 1.7–7.7)
Platelets: 168 10*3/uL (ref 150–400)
RBC: 3.45 MIL/uL — AB (ref 3.87–5.11)
RDW: 14.4 % (ref 11.5–15.5)
WBC: 10.8 10*3/uL — ABNORMAL HIGH (ref 4.0–10.5)

## 2014-10-05 LAB — BASIC METABOLIC PANEL WITH GFR
Anion gap: 7 (ref 5–15)
BUN: <5 mg/dL — ABNORMAL LOW (ref 6–23)
CO2: 28 mmol/L (ref 19–32)
Calcium: 8.1 mg/dL — ABNORMAL LOW (ref 8.4–10.5)
Chloride: 102 mmol/L (ref 96–112)
Creatinine, Ser: 0.58 mg/dL (ref 0.50–1.10)
GFR calc Af Amer: >90 mL/min
GFR calc non Af Amer: >90 mL/min
Glucose, Bld: 128 mg/dL — ABNORMAL HIGH (ref 70–99)
Potassium: 3.3 mmol/L — ABNORMAL LOW (ref 3.5–5.1)
Sodium: 137 mmol/L (ref 135–145)

## 2014-10-05 LAB — PROCALCITONIN: Procalcitonin: 0.71 ng/mL

## 2014-10-05 LAB — PHOSPHORUS: Phosphorus: 3.5 mg/dL (ref 2.3–4.6)

## 2014-10-05 LAB — MAGNESIUM: Magnesium: 1.8 mg/dL (ref 1.5–2.5)

## 2014-10-05 LAB — CK: Total CK: 3352 U/L — ABNORMAL HIGH (ref 7–177)

## 2014-10-05 MED ORDER — POTASSIUM CHLORIDE CRYS ER 20 MEQ PO TBCR
40.0000 meq | EXTENDED_RELEASE_TABLET | Freq: Every day | ORAL | Status: DC
  Administered 2014-10-05 – 2014-10-08 (×4): 40 meq via ORAL
  Filled 2014-10-05 (×4): qty 2

## 2014-10-05 MED ORDER — IPRATROPIUM-ALBUTEROL 0.5-2.5 (3) MG/3ML IN SOLN
3.0000 mL | Freq: Two times a day (BID) | RESPIRATORY_TRACT | Status: DC
  Administered 2014-10-05 – 2014-10-07 (×5): 3 mL via RESPIRATORY_TRACT
  Filled 2014-10-05 (×5): qty 3

## 2014-10-05 NOTE — Clinical Social Work Note (Signed)
CSW received consult for "overdose on benzos and opiates." If treatment team believes this was intentional please order a psychiatry consult.  Roddie McBryant Tyasia Packard MSW, [place]CSWA, [place], [phone]
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
## 2014-10-05 NOTE — Progress Notes (Signed)
Physical Therapy Treatment Patient Details Name: Charlotte Strong MRN: [id number] DOB: 1975/09/09 Today's Date: 10/05/2014    History of Present Illness Adm 4/20 s/p OD (found on floor after ?48 hours) with rhabdomyolysis, renal failure, intubated 4/20-4/21; Lt UE and LE nerve palsies due to pressure with blistering of Lt foot; all xrays negative for fracture PMHx-drug use; depression    PT Comments    Pt was able to do some short walking today, with a close eye on her O2 sats and HR.  Her HR is running up to 130, with rest to let it drop down.  O2 sats were 90+percent with drops as she was clutching walker with R hand and better once sitting.  Will have to monitor for any real distances to make sure pt is tolerating it medically.  Follow Up Recommendations  CIR;Supervision/Assistance - 24 hour     Equipment Recommendations  Rolling walker with 5" wheels;Other (comment) (platform to LUE)    Recommendations for Other Services Rehab consult;OT consult     Precautions / Restrictions Precautions Precautions: Fall Restrictions Weight Bearing Restrictions: No    Mobility  Bed Mobility Overal bed mobility: Needs Assistance Bed Mobility: Supine to Sit     Supine to sit: Min guard;HOB elevated     General bed mobility comments:  (assisted to R side and pt could then scoot to EOB with super)  Transfers Overall transfer level: Needs assistance Equipment used: Rolling walker (2 wheeled);1 person hand held assist (platform walker to LUE) Transfers: Sit to/from [employer] to Stand: Min assist Stand pivot transfers: Min assist          Ambulation/Gait Ambulation/Gait assistance: Min assist;+2 physical assistance;+2 safety/equipment Ambulation Distance (Feet): 15 Feet Assistive device: Rolling walker (2 wheeled);1 person hand held assist (platform for LUE) Gait Pattern/deviations: Step-to pattern;Decreased stance time - left;Decreased stride length;Decreased  dorsiflexion - left;Decreased dorsiflexion - right;Decreased weight shift to left;Wide base of support;Drifts right/left;Antalgic Gait velocity: slow and stopping Gait velocity interpretation: Below normal speed for age/gender General Gait Details: Added L platform walker with success to tolerate the movement   Stairs            Wheelchair Mobility    Modified Rankin (Stroke Patients Only)       Balance Overall balance assessment: Needs assistance Sitting-balance support: Feet supported Sitting balance-Leahy Scale: Good   Postural control: Posterior lean Standing balance support: Bilateral upper extremity supported Standing balance-Leahy Scale: Poor                      Cognition Arousal/Alertness: Awake/alert Behavior During Therapy: WFL for tasks assessed/performed Overall Cognitive Status: Within Functional Limits for tasks assessed                      Exercises      General Comments General comments (skin integrity, edema, etc.): Pt's mother stepped out during therapy and returned as pt was up in chair.  Has a great deal of fluctuation in vitals as she walks with short distances of 6' and 9' to break up and allow her to get a sitting rest.      Pertinent Vitals/Pain Pain Assessment: 0-10 Pain Score: 8  Pain Location: LLE and arm Pain Intervention(s): Limited activity within patient's tolerance;Monitored during session;Premedicated before session;Repositioned    Home Living                      Prior Function  PT Goals (current goals can now be found in the care plan section) Acute Rehab PT Goals Patient Stated Goal: be able to walk  Progress towards PT goals: Progressing toward goals    Frequency  Min 4X/week    PT Plan Current plan remains appropriate    Co-evaluation             End of Session Equipment Utilized During Treatment: Oxygen Activity Tolerance: Patient tolerated treatment well Patient  left: in chair;with call bell/phone within reach;with family/visitor present     Time: 1150-1229 PT Time Calculation (min) (ACUTE ONLY): 39 min  Charges:  $Gait Training: 23-37 mins $Therapeutic Activity: 8-22 mins                    G Codes:      Ivar DrapeStout, Conny Situ E 10/05/2014, 1:38 PM  Samul Dadauth Ranika Mcniel, PT MS Acute Rehab Dept. Number: [phone]

## 2014-10-05 NOTE — Progress Notes (Signed)
OT SPLINT   OT fabricated a L resting hand splint on volar aspect of arm. Pt positioned with 10 degree of wrist extension and thumb abducted/ extended as allowed due to edema. Pt L wrist elevated prior to fabrication of splint to help with edema management. Wound care applied dressing to burn site and  OT provided stockinette applied over gauze dressing.   Edema on L UE noted to be pitting 1. Wound noted on dorsal aspect of hand starting at wrist upward on forearm. Strapping of splint applied between burn locations; not directly on burn site. Pt noted to have no sensation on the volar aspect of the hand with testing. Pt noted to have no dorsal sensation on 5th digit as well. Pt with good sensation on dorsal aspect of hand except 5th digit. Pt able to perform digit flexion limited by edema and demonstrates limited range with digit extension. Pt adducting thumb but less than 5 degrees of abduction or flexion / extension.   OT to continue to follow acutely for splint wear and check skin.   Charlotte Strong, Charlotte Strong   OTR/L Pager: [phone] Office: [phone] .

## 2014-10-05 NOTE — Progress Notes (Signed)
Rehab Admissions Coordinator Note:  Patient was screened by Clois DupesBoyette, Kourtni Stineman Godwin for appropriateness for an Inpatient Acute Rehab Consult per PT recommendation.  At this time, we are recommending Inpatient Rehab consult if you would like an assessment for a possible admission.  Clois DupesBoyette, Ermelinda Eckert Godwin 10/05/2014, 9:01 AM  I can be reached at [phone].

## 2014-10-05 NOTE — Progress Notes (Addendum)
OT NOTE  RN STAFF  Please check splint every 4 hours during shift ( remove splint , remove stockinette) to assess for: * pain * redness *swelling  If any symptoms above present remove splint for 15 minutes. If symptoms continue - keep the splint removed and notify OT staff [phone] immediately.   Keep the L UE elevated with wrist above elbow at all times.   Splint can be cleaned with warm soapy water and alcohol swab. Splint should not be placed in heat of any kind because the splint with mold into a new shape.    Mateo FlowJones, Brynn   OTR/L Pager: [phone] Office: [phone] .

## 2014-10-05 NOTE — Progress Notes (Signed)
OT NOTE  L Volar resting hand splint checks at this time and no redness, swelling or pain noted. Splint reapplied correctly and elevated on pillows/ towels in proper alignment.  Pt completed AROM L hand digits. Pt with noticeable reduced swelling. Ot repositioning hand on pillows.  Mother present and verbalized she will be main caregiver upon d/c and works 8a-2p as caregiver. Mother reports she can schedule time off to be with patient if given notice ahead of time.   OT to continue to follow acutely and monitor splint.  RN staff please notify OT staff immediately if you notice any changes [phone]  OT to complete evaluation of ADLS 10/06/14   Charlotte Strong, Charlotte Strong   OTR/L Pager: [phone] Office: [phone] .

## 2014-10-05 NOTE — Care Management Note (Signed)
    Page 1 of 1   10/08/2014     6:17:30 PM CARE MANAGEMENT NOTE 10/08/2014  Patient:  Renato GailsJACOBS,Gerda   Account Number:  [id number]  Date Initiated:  10/01/2014  Documentation initiated by:  Avie ArenasBROWN,SARAH  Subjective/Objective Assessment:   Found down - Admitted with OD - intubated.     Action/Plan:   Anticipated DC Date:  10/08/2014   Anticipated DC Plan:  IP REHAB FACILITY  In-house referral  Clinical Social Worker      DC Associate Professorlanning Services  CM consult  [facility]      Choice offered to / List presented to:             Status of service:  Completed, signed off Medicare Important Message given?  NO (If response is "NO", the following Medicare IM given date fields will be blank) Date Medicare IM given:   Medicare IM given by:   Date Additional Medicare IM given:   Additional Medicare IM given by:    Discharge Disposition:  IP REHAB FACILITY  Per UR Regulation:  Reviewed for med. necessity/level of care/duration of stay  If discussed at Long Length of Stay Meetings, dates discussed:    Comments:  10-05-14 [facility] follow up on 5-2 at 11:00. will follow for further DC needs. Lawerance Sabalebbie Swist RN BSN CM  Contact:  Dewry,Carole Mother [phone]  10/08/14 1816 Letha Capeeborah Curry Seefeldt RN, BSN [phone] patient dc to CIR.

## 2014-10-05 NOTE — Progress Notes (Signed)
[facility]                                                                              Patient Demographics  Charlotte Strong, is a 39 y.o. female, DOB - 02/22/1976, EAV:[id number]  Admit date - 09/30/2014   Admitting Physician Kalman Shan, MD  Outpatient Primary MD for the patient is No PCP Per Patient  LOS - 5   Chief Complaint  Patient presents with  . Altered Mental Status       Brief HPI   Patient is a 39 year old female with right abuse, asthma who was admitted on 4/20 by pulmonary critical care for drug overdose on benzodiazepines and opioids, found to have rhabdomyolysis and respiratory failure. Patient was found by family in the bathroom floor at midnight, unresponsive and agonal breathing. EMS was called and did nasotracheal intubation. Patient was givennarcan with minimal response. She was intubated, with intubation patient became a little more responsive. Patient was found to have rhabdomyolysis with acute renal failure, lactic acidosis, CK 6000 with ATN. Patient had areas of induration on the left lower extremity, left gluteus, left wrist. Patient was admitted by critical care service to ICU. Orthopedics was consulted for possible fasciotomy. Patient was transferred to telemetry floor on 4/24, [facility] service assumed care on 4/25  Significant events 4/20>> Intubated and admitted to MICU 4/21>> Extubated 10/02/14: Patient complains of continued pain on left side specifically in her left foot, knee, hip and wrist. She is unable to move her left toes due to the pain. She admits to productive cough, but denies fever.     Assessment & Plan   Principal problem Severe sepsis with Escherichia coli UTI, aspiration pneumonia, cellulitis - Currently improving, patient has completed vancomycin, Levaquin, aztreonam, currently on Ceftin  - Gentle hydration, pro-calcitonin trending down  Active problems Acute rhabdomyolysis with metabolic acidosis,  hypokalemia - CK trending down, 3352 today - Replaced potassium  Acute respiratory failure with Hypotension - Extubated on 4/21, currently O2 sats 95% on 2 L  Aspiration pneumonia, leukocytosis - Continue Ceftin, mobilization, incentive spirometry  Escherichia coli UTI Urine culture positive for Escherichia coli, continue Ceftin  Polysubstance abuse with drug overdose - Patient counseled on quitting drugs, - Continue fentanyl and Dilaudid PCA, Narcan when necessary  Reperfusion injury, radial nerve biopsy, cellulitis, tenosynovitis - PT, OT, gentle hydration, wound care, Silvadene cream, follow hand surgery outpatient  Code Status: Full code   Family Communication: Discussed in detail with the patient, all imaging results, lab results explained to the patient    Disposition Plan: Inpatient rehabilitation, consult placed  Time Spent in minutes   25 minutes  Procedures  CT head/ c-spine 4/20>> neg acute  CXR 4/21>> NG tube tip projects over the upper thorax and should be repositioned/advanced. Left greater than right airspace opacities may reflect pneumonia and/or aspiration. L Ankle Xray 4/22>> Normal Left Femur Xray 4/22>> Normal Left Hip Xray 4/22>> No acute or subacute osseous abnormalities involving the pelvis. Symmetric normal-appearing joint spaces in both hips. Calcification adjacent to the superior left acetabular rim. Acetabular rim calcifications can be due to labral calcification/ossification, acetabular rim stress fractures, or  os acetabuli. Os acetabuli and acetabular rim stress fractures put the patient at increased risk for pincer type femoro-acetabular impingement. [place] Left Wrist 4/22>>Subcutaneous edema as well as edema in the soft tissues around the tendons in the distal forearm consistent with cellulitis and tenosynovitis. No defined abscesses.  Left Wrist Xray 4/22>> CPPD involving the triangular fibrocartilage complex.  Consults   Orthopedics Critical  care Wound care   DVT Prophylaxis   heparin Medications  Scheduled Meds: . antiseptic oral rinse  7 mL Mouth Rinse BID  . budesonide (PULMICORT) nebulizer solution  0.25 mg Nebulization BID  . cefUROXime  500 mg Oral BID WC  . heparin  5,000 Units Subcutaneous 3 times per day  . HYDROmorphone PCA 0.3 mg/mL   Intravenous 6 times per day  . ipratropium-albuterol  3 mL Nebulization Q4H  . phosphorus  500 mg Oral TID WC & HS  . silver sulfADIAZINE   Topical Daily   Continuous Infusions: . sodium chloride 50 mL/hr at 10/04/14 0905   PRN Meds:.albuterol, ALPRAZolam, docusate, fentaNYL (SUBLIMAZE) injection, guaiFENesin-codeine, ibuprofen, naloxone **AND** sodium chloride, ondansetron (ZOFRAN) IV, sodium chloride   Antibiotics   Anti-infectives    Start     Dose/Rate Route Frequency Ordered Stop   10/04/14 1700  cefUROXime (CEFTIN) tablet 500 mg     500 mg Oral 2 times daily with meals 10/04/14 1244     10/02/14 1030  vancomycin (VANCOCIN) IVPB 750 mg/150 ml premix  Status:  Discontinued     750 mg 150 mL/hr over 60 Minutes Intravenous Every 12 hours 10/02/14 0957 10/03/14 0843   10/01/14 1600  levofloxacin (LEVAQUIN) IVPB 500 mg  Status:  Discontinued     500 mg 100 mL/hr over 60 Minutes Intravenous Every 48 hours 09/30/14 2219 10/01/14 1103   10/01/14 1600  aztreonam (AZACTAM) 1 g in dextrose 5 % 50 mL IVPB  Status:  Discontinued     1 g 100 mL/hr over 30 Minutes Intravenous 3 times per day 10/01/14 1233 10/04/14 1244   10/01/14 1500  vancomycin (VANCOCIN) 500 mg in sodium chloride 0.9 % 100 mL IVPB  Status:  Discontinued     500 mg 100 mL/hr over 60 Minutes Intravenous Every 24 hours 09/30/14 1753 10/02/14 0957   10/01/14 1500  levofloxacin (LEVAQUIN) IVPB 500 mg  Status:  Discontinued     500 mg 100 mL/hr over 60 Minutes Intravenous Every 24 hours 09/30/14 1753 09/30/14 2219   10/01/14 0000  aztreonam (AZACTAM) 1 g in dextrose 5 % 50 mL IVPB  Status:  Discontinued     1  g 100 mL/hr over 30 Minutes Intravenous 3 times per day 09/30/14 1753 10/01/14 0824   09/30/14 1445  aztreonam (AZACTAM) 2 g in dextrose 5 % 50 mL IVPB     2 g 100 mL/hr over 30 Minutes Intravenous  Once 09/30/14 1409 09/30/14 1610   09/30/14 1415  levofloxacin (LEVAQUIN) IVPB 750 mg     750 mg 100 mL/hr over 90 Minutes Intravenous  Once 09/30/14 1409 09/30/14 1757   09/30/14 1415  vancomycin (VANCOCIN) IVPB 1000 mg/200 mL premix     1,000 mg 200 mL/hr over 60 Minutes Intravenous  Once 09/30/14 1409 09/30/14 1537        Subjective:   Charlotte Strong was seen and examined today. Patient denies dizziness, chest pain, shortness of breath, abdominal pain, N/V/D/C, new weakness, numbess, tingling. No acute events overnight. Continues to complain of severe pain on the left side in the  leg, left hand, able to move her extremities, fingers and toes,    Objective:   Blood pressure 124/71, pulse 68, temperature 97.9 F (36.6 C), temperature source Oral, resp. rate 16, height 5\' 6"  (1.676 m), weight 65.8 kg (145 lb 1 oz), SpO2 99 %.  Wt Readings from Last 3 Encounters:  10/04/14 65.8 kg (145 lb 1 oz)  10/07/13 52.164 kg (115 lb)  07/01/13 58.968 kg (130 lb)     Intake/Output Summary (Last 24 hours) at 10/05/14 1157 Last data filed at 10/05/14 1036  Gross per 24 hour  Intake   1500 ml  Output   8040 ml  Net  -6540 ml    Exam  General: Alert and oriented x 3, NAD  HEENT:  PERRLA, EOMI, Anicteic Sclera, mucous membranes moist.   Neck: Supple, no JVD, no masses  CVS: S1 S2 auscultated, no rubs, murmurs or gallops. Regular rate and rhythm.  Respiratory: Diffuse bilateral rhonchi but no wheezing  Abdomen: Soft, nontender, nondistended, + bowel sounds  Ext:  Tender on the left foot and on the skin at the left hand and wrist  Neuro: AAOx3, Cr N's II- XII. Strength 5/5 upper and lower extremities bilaterally  Skin: No rashes  Psych: Normal affect and demeanor, alert and  oriented x3    Data Review   Micro Results Recent Results (from the past 240 hour(s))  Blood Culture (routine x 2)     Status: None (Preliminary result)   Collection Time: 09/30/14  2:15 PM  Result Value Ref Range Status   Specimen Description BLOOD RIGHT FOREARM  Final   Special Requests BOTTLES DRAWN AEROBIC ONLY 3CC  Final   Culture   Final           BLOOD CULTURE RECEIVED NO GROWTH TO DATE CULTURE WILL BE HELD FOR 5 DAYS BEFORE ISSUING A FINAL NEGATIVE REPORT [facility]formed [facility]    Report Status PENDING  Incomplete  Blood Culture (routine x 2)     Status: None (Preliminary result)   Collection Time: 09/30/14  2:34 PM  Result Value Ref Range Status   Specimen Description BLOOD LEFT HAND  Final   Special Requests BOTTLES DRAWN AEROBIC ONLY 3CC  Final   Culture   Final           BLOOD CULTURE RECEIVED NO GROWTH TO DATE CULTURE WILL BE HELD FOR 5 DAYS BEFORE ISSUING A FINAL NEGATIVE REPORT Performed at [facility]    Report Status PENDING  Incomplete  Urine culture     Status: None   Collection Time: 09/30/14  2:54 PM  Result Value Ref Range Status   Specimen Description URINE, CATHETERIZED  Final   Special Requests NONE  Final   Colony Count   Final    >=100,000 COLONIES/ML Performed at [facility]    Culture   Final    ESCHERICHIA COLI Performed at [facility]    Report Status 10/03/2014 FINAL  Final   Organism ID, Bacteria ESCHERICHIA COLI  Final      Susceptibility   Escherichia coli - MIC*    AMPICILLIN >=32 RESISTANT Resistant     CEFAZOLIN <=4 SENSITIVE Sensitive     CEFTRIAXONE <=1 SENSITIVE Sensitive     CIPROFLOXACIN 1 SENSITIVE Sensitive     GENTAMICIN >=16 RESISTANT Resistant     LEVOFLOXACIN 1 SENSITIVE Sensitive     NITROFURANTOIN <=16 SENSITIVE Sensitive     TOBRAMYCIN 8 INTERMEDIATE Intermediate  TRIMETH/SULFA >=320 RESISTANT Resistant     PIP/TAZO <=4 SENSITIVE Sensitive     * ESCHERICHIA COLI  MRSA  PCR Screening     Status: None   Collection Time: 09/30/14  5:55 PM  Result Value Ref Range Status   MRSA by PCR NEGATIVE NEGATIVE Final    Comment:        The GeneXpert MRSA Assay (FDA approved for NASAL specimens only), is one component of a comprehensive MRSA colonization surveillance program. It is not intended to diagnose MRSA infection nor to guide or monitor treatment for MRSA infections.   Culture, blood (routine x 2)     Status: None (Preliminary result)   Collection Time: 10/04/14  4:10 PM  Result Value Ref Range Status   Specimen Description BLOOD CENTRAL LINE  Final   Special Requests BOTTLES DRAWN AEROBIC AND ANAEROBIC 10 CC  Final   Culture   Final           BLOOD CULTURE RECEIVED NO GROWTH TO DATE CULTURE WILL BE HELD FOR 5 DAYS BEFORE ISSUING A FINAL NEGATIVE REPORT Performed at [facility]    Report Status PENDING  Incomplete    Radiology Reports Dg Pelvis 1-2 Views  10/02/2014   CLINICAL DATA:  Acute onset left hip pain radiating into the left leg. No known injury.  EXAM: PELVIS - 1-2 VIEW  COMPARISON:  Left femur x-rays obtained concurrently. Bone window images from CT abdomen and pelvis 10/06/2009.  FINDINGS: No evidence of acute fracture. Hip joints intact with symmetric well preserved joint spaces. Calcification adjacent to the left acetabular rim superiorly. Sacroiliac joints and symphysis pubis intact without evidence of diastasis or significant degenerative change. Visualized lower lumbar spine intact. Well preserved bone mineral density. No intrinsic osseous abnormality.  IMPRESSION: 1. No acute or subacute osseous abnormalities involving the pelvis. 2. Symmetric normal-appearing joint spaces in both hips. 3. Calcification adjacent to the superior left acetabular rim. Acetabular rim calcifications can be due to labral calcification/ossification, acetabular rim stress fractures, or os acetabuli. Os acetabuli and acetabular rim stress fractures put the  patient at increased risk for pincer type femoroacetabular impingement.   Electronically Signed   By: Hulan Saas M.D.   On: 10/02/2014 20:46   Dg Wrist Complete Left  10/02/2014   CLINICAL DATA:  Acute onset of left wrist pain and swelling. No known injury.  EXAM: LEFT WRIST - COMPLETE 3+ VIEW  COMPARISON:  None.  FINDINGS: No evidence of acute, subacute or healed fractures. Well preserved joint spaces. Well preserved bone mineral density. Faint calcification involving the triangular fibrocartilage complex.  IMPRESSION: No acute or subacute osseous abnormality. CPPD involving the triangular fibrocartilage complex.   Electronically Signed   By: Hulan Saas M.D.   On: 10/02/2014 20:44   Dg Ankle Complete Left  10/02/2014   CLINICAL DATA:  Acute onset left ankle pain and swelling. No known injury.  EXAM: LEFT ANKLE COMPLETE - 3+ VIEW  COMPARISON:  None.  FINDINGS: No evidence of acute fracture. Ankle mortise intact with well-preserved joint space. Well-preserved bone mineral density. No intrinsic osseous abnormalities. No visible joint effusion.  IMPRESSION: Normal examination.   Electronically Signed   By: Hulan Saas M.D.   On: 10/02/2014 20:46   Ct Head Wo Contrast  09/30/2014   CLINICAL DATA:  39 year old female with altered mental status with possible drug overdose.  EXAM: CT HEAD WITHOUT CONTRAST  CT CERVICAL SPINE WITHOUT CONTRAST  TECHNIQUE: Multidetector CT imaging of the head  and cervical spine was performed following the standard protocol without intravenous contrast. Multiplanar CT image reconstructions of the cervical spine were also generated.  COMPARISON:  Prior CT scan of the head and cervical spine 06/20/2011  FINDINGS: CT HEAD FINDINGS  Insert tonight via perhaps mild soft tissue contusion left temporal scalp just above the ear. No underlying calvarial injury. Mastoid air cells and paranasal sinuses are well aerated. Orbits and globes are intact and symmetric bilaterally.  CT  CERVICAL SPINE FINDINGS  No acute fracture, malalignment or prevertebral soft tissue swelling. Unremarkable CT appearance of the thyroid gland. No acute soft tissue abnormality. Biapical pleural parenchymal scarring. The patient is intubated and a nasogastric tube is present. Both tubes are incompletely imaged.  IMPRESSION: CT HEAD  1. Negative CT CSPINE  1. Negative   Electronically Signed   By: Malachy Moan M.D.   On: 09/30/2014 15:33   Ct Cervical Spine Wo Contrast  09/30/2014   CLINICAL DATA:  39 year old female with altered mental status with possible drug overdose.  EXAM: CT HEAD WITHOUT CONTRAST  CT CERVICAL SPINE WITHOUT CONTRAST  TECHNIQUE: Multidetector CT imaging of the head and cervical spine was performed following the standard protocol without intravenous contrast. Multiplanar CT image reconstructions of the cervical spine were also generated.  COMPARISON:  Prior CT scan of the head and cervical spine 06/20/2011  FINDINGS: CT HEAD FINDINGS  Insert tonight via perhaps mild soft tissue contusion left temporal scalp just above the ear. No underlying calvarial injury. Mastoid air cells and paranasal sinuses are well aerated. Orbits and globes are intact and symmetric bilaterally.  CT CERVICAL SPINE FINDINGS  No acute fracture, malalignment or prevertebral soft tissue swelling. Unremarkable CT appearance of the thyroid gland. No acute soft tissue abnormality. Biapical pleural parenchymal scarring. The patient is intubated and a nasogastric tube is present. Both tubes are incompletely imaged.  IMPRESSION: CT HEAD  1. Negative CT CSPINE  1. Negative   Electronically Signed   By: Malachy Moan M.D.   On: 09/30/2014 15:33   [place] Extrem Up Left Ltd  10/02/2014   CLINICAL DATA:  Edema in the left hand and wrist.  EXAM: ULTRASOUND OF THE LEFT HAND AND WRIST  LIMITED  TECHNIQUE: Ultrasound examination of the upper extremity soft tissues was performed in the area of clinical concern.  COMPARISON:  Right  hand and wrist was used for comparison.  FINDINGS: There is subcutaneous edema without a definable abscess. There is also edema around the tendons in the distal forearm. No definable abscess.  IMPRESSION: Subcutaneous edema as well as edema in the soft tissues around the tendons in the distal forearm consistent with cellulitis and tenosynovitis. No defined abscesses.   Electronically Signed   By: Francene Boyers M.D.   On: 10/02/2014 18:54   Dg Chest Port 1 View  10/01/2014   CLINICAL DATA:  Dyspnea and worsening shortness of breath.  EXAM: PORTABLE CHEST - 1 VIEW  COMPARISON:  10/01/2014  FINDINGS: Multifocal airspace opacities, increased and most pronounced left perihilar and retrocardiac, right upper lobe/ apex. No overt effusion. No pneumothorax. Cardiomediastinal contours within normal range. Interval extubation. No acute osseous finding. Surgical clips right upper quadrant.  IMPRESSION: Increased bilateral airspace opacities, may reflect multifocal pneumonia and/or edema.  Recommend short-term radiograph follow-up to document resolution.   Electronically Signed   By: Jearld Lesch M.D.   On: 10/01/2014 21:29   Dg Chest Port 1 View  10/01/2014   CLINICAL DATA:  Aspiration pneumonia, shortness  of breath, intubated.  EXAM: PORTABLE CHEST - 1 VIEW  COMPARISON:  09/30/2014  FINDINGS: Endotracheal tube tip projects 3.3 cm proximal to the carina. NG tube tip projects over the upper thorax and may be within the proximal/mid esophagus. Multifocal airspace opacities, most pronounced within the left lower lung. No pleural effusion or pneumothorax. No acute osseous finding.  IMPRESSION: NG tube tip projects over the upper thorax and should be repositioned/advanced. Discussed via telephone with the patient's nurse, Roe Coombs, at 6:08 a.m. on 10/01/2014.  Left greater than right airspace opacities may reflect pneumonia and/or aspiration.   Electronically Signed   By: Jearld Lesch M.D.   On: 10/01/2014 06:11   Dg  Chest Port 1 View  09/30/2014   CLINICAL DATA:  Or dental status.  EXAM: PORTABLE CHEST - 1 VIEW  COMPARISON:  09/27/2013 head CT scan dated 10/07/2013  FINDINGS: Endotracheal tube and OG tube within inserted in appear in good position. Is a left perihilar infiltrate extending into the upper lobe and a faint patchy infiltrate at the left lung base. There is also suggestion of infiltrate/ atelectasis in the right upper lobe medially. Right mid and lower lung zones are clear. Heart size and vascularity are normal. No osseous abnormality.  IMPRESSION: Bilateral infiltrates,/ atelectasis, mainly on the left. The possibility of aspiration pneumonitis should be considered .   Electronically Signed   By: Francene Boyers M.D.   On: 09/30/2014 14:50   Dg Femur Min 2 Views Left  10/02/2014   CLINICAL DATA:  Acute onset left hip pain radiating into the left leg. No known injuries.  EXAM: LEFT FEMUR 2 VIEWS  COMPARISON:  None.  FINDINGS: No evidence of acute, subacute or healed fractures. Well preserved bone mineral density. No intrinsic osseous abnormality involving the femur. Visualized hip joint intact though there is a small calcification adjacent to the acetabular rim superiorly. Visualized knee joint intact.  IMPRESSION: 1. Normal-appearing left femur. 2. Small calcification adjacent to the superior acetabular rim. Acetabular rim calcifications can be due to labral calcification/ossification, acetabular rim stress fractures, or os acetabuli. Os acetabuli and acetabular rim stress fractures put the patient at increased risk for pincer type femoroacetabular impingement.   Electronically Signed   By: Hulan Saas M.D.   On: 10/02/2014 20:42    CBC  Recent Labs Lab 09/30/14 1415 09/30/14 1906 10/01/14 0421 10/02/14 0533 10/04/14 0230 10/05/14 0910  WBC 19.1* 12.6* 9.7 10.4 9.1 10.8*  HGB 14.1 13.7 12.8 12.4 10.1* 10.7*  HCT 44.7 41.1 38.5 36.7 29.8* 31.7*  PLT 340 244 236 185 158 168  MCV 98.5 96.0  93.9 93.6 92.0 91.9  MCH 31.1 32.0 31.2 31.6 31.8 31.0  MCHC 31.5 33.3 33.2 33.8 34.6 33.8  RDW 14.4 14.4 14.4 14.6 14.4 14.4  LYMPHSABS 1.0 0.9  --   --  1.1 1.0  MONOABS 1.8* 0.8  --   --  0.7 0.9  EOSABS 0.0 0.0  --   --  0.1 0.3  BASOSABS 0.0 0.0  --   --  0.0 0.0    Chemistries   Recent Labs Lab 09/30/14 1415  09/30/14 1906  10/01/14 1840 10/02/14 0533 10/03/14 0241 10/04/14 0630 10/05/14 0910  NA 138  < > 139  < > 138 142 139 137 137  K 6.5*  < > 5.3*  < > 3.0* 3.7 2.7* 2.8* 3.3*  CL 101  < > 113*  < > 99 105 102 104 102  CO2 18*  < >  16*  < > 32 28 30 24 28   GLUCOSE 127*  < > 165*  < > 245* 100* 148* 148* 128*  BUN 34*  < > 29*  < > 11 8 <5* <5* <5*  CREATININE 2.93*  < > 1.78*  < > 1.01 0.99 0.79 0.72 0.58  CALCIUM 8.2*  < > 6.2*  < > 6.2* 7.5* 7.5* 8.0* 8.1*  MG  --   --   --   --   --   --   --  1.5 1.8  AST 174*  --  307*  --   --   --  571* 362*  --   ALT 85*  --  109*  --   --   --  227* 214*  --   ALKPHOS 111  --  86  --   --   --  81 89  --   BILITOT 0.8  --  0.8  --   --   --  0.7 0.6  --   < > = values in this interval not displayed. ------------------------------------------------------------------------------------------------------------------ estimated creatinine clearance is 91 mL/min (by C-G formula based on Cr of 0.58). ------------------------------------------------------------------------------------------------------------------ No results for input(s): HGBA1C in the last 72 hours. ------------------------------------------------------------------------------------------------------------------ No results for input(s): CHOL, HDL, LDLCALC, TRIG, CHOLHDL, LDLDIRECT in the last 72 hours. ------------------------------------------------------------------------------------------------------------------ No results for input(s): TSH, T4TOTAL, T3FREE, THYROIDAB in the last 72 hours.  Invalid input(s):  FREET3 ------------------------------------------------------------------------------------------------------------------ No results for input(s): VITAMINB12, FOLATE, FERRITIN, TIBC, IRON, RETICCTPCT in the last 72 hours.  Coagulation profile  Recent Labs Lab 10/03/14 0844  INR 1.38    No results for input(s): DDIMER in the last 72 hours.  Cardiac Enzymes  Recent Labs Lab 10/01/14 0421 10/03/14 0844 10/04/14 0630  CKMB >300.0* 17.0* 8.8*   ------------------------------------------------------------------------------------------------------------------ Invalid input(s): POCBNP  No results for input(s): GLUCAP in the last 72 hours.   Noelle Hoogland M.D. [facility] 10/05/2014, 11:57 AM  Pager: [phone]   Between 7am to 7pm - call Pager - [phone]  After 7pm go to [email or web address] - password [email or web address]  Call night coverage person covering after 7pm

## 2014-10-06 LAB — BASIC METABOLIC PANEL
ANION GAP: 9 (ref 5–15)
BUN: 6 mg/dL (ref 6–23)
CHLORIDE: 102 mmol/L (ref 96–112)
CO2: 29 mmol/L (ref 19–32)
CREATININE: 0.65 mg/dL (ref 0.50–1.10)
Calcium: 8.6 mg/dL (ref 8.4–10.5)
GFR calc Af Amer: >90 mL/min (ref >90)
GFR calc non Af Amer: >90 mL/min (ref >90)
Glucose, Bld: 94 mg/dL (ref 70–99)
POTASSIUM: 3.8 mmol/L (ref 3.5–5.1)
SODIUM: 140 mmol/L (ref 135–145)

## 2014-10-06 LAB — CBC WITH DIFFERENTIAL/PLATELET
BASOS PCT: 0 % (ref 0–1)
Basophils Absolute: 0 10*3/uL (ref 0.0–0.1)
Eosinophils Absolute: 0.4 10*3/uL (ref 0.0–0.7)
Eosinophils Relative: 5 % (ref 0–5)
HCT: 30.8 % — ABNORMAL LOW (ref 36.0–46.0)
Hemoglobin: 10.5 g/dL — ABNORMAL LOW (ref 12.0–15.0)
LYMPHS ABS: 1.8 10*3/uL (ref 0.7–4.0)
LYMPHS PCT: 20 % (ref 12–46)
MCH: 31.3 pg (ref 26.0–34.0)
MCHC: 34.1 g/dL (ref 30.0–36.0)
MCV: 91.7 fL (ref 78.0–100.0)
MONO ABS: 1.1 10*3/uL — AB (ref 0.1–1.0)
Monocytes Relative: 12 % (ref 3–12)
Neutro Abs: 5.5 10*3/uL (ref 1.7–7.7)
Neutrophils Relative %: 63 % (ref 43–77)
PLATELETS: 189 10*3/uL (ref 150–400)
RBC: 3.36 MIL/uL — AB (ref 3.87–5.11)
RDW: 14.6 % (ref 11.5–15.5)
WBC: 8.8 10*3/uL (ref 4.0–10.5)

## 2014-10-06 LAB — CULTURE, BLOOD (ROUTINE X 2)
CULTURE: NO GROWTH
CULTURE: NO GROWTH

## 2014-10-06 LAB — MAGNESIUM: MAGNESIUM: 1.9 mg/dL (ref 1.5–2.5)

## 2014-10-06 LAB — PHOSPHORUS: PHOSPHORUS: 3.8 mg/dL (ref 2.3–4.6)

## 2014-10-06 MED ORDER — DOCUSATE SODIUM 100 MG PO CAPS
100.0000 mg | ORAL_CAPSULE | Freq: Two times a day (BID) | ORAL | Status: DC
  Administered 2014-10-06 – 2014-10-08 (×4): 100 mg via ORAL
  Filled 2014-10-06 (×5): qty 1

## 2014-10-06 MED ORDER — METOPROLOL TARTRATE 1 MG/ML IV SOLN
5.0000 mg | Freq: Once | INTRAVENOUS | Status: AC
  Administered 2014-10-06: 5 mg via INTRAVENOUS
  Filled 2014-10-06: qty 5

## 2014-10-06 MED ORDER — LORAZEPAM 2 MG/ML IJ SOLN
1.0000 mg | Freq: Once | INTRAMUSCULAR | Status: AC
  Administered 2014-10-06: 1 mg via INTRAVENOUS
  Filled 2014-10-06: qty 1

## 2014-10-06 MED ORDER — POLYETHYLENE GLYCOL 3350 17 G PO PACK
17.0000 g | PACK | Freq: Every day | ORAL | Status: DC | PRN
  Administered 2014-10-06: 17 g via ORAL
  Filled 2014-10-06 (×2): qty 1

## 2014-10-06 MED ORDER — ONDANSETRON HCL 4 MG/2ML IJ SOLN
4.0000 mg | INTRAMUSCULAR | Status: DC | PRN
  Administered 2014-10-06: 4 mg via INTRAVENOUS
  Filled 2014-10-06: qty 2

## 2014-10-06 NOTE — Progress Notes (Signed)
Pt heart rate was 120-130's. Rai MD notified.

## 2014-10-06 NOTE — Progress Notes (Signed)
Occupational Therapy Treatment/ SPLINT CHECK noted below  Patient Details Name: Charlotte Strong MRN: [id number] DOB: Aug 31, 1975 Today's Date: 10/06/2014    History of present illness Adm 4/20 s/p OD (found on floor after ?48 hours) with rhabdomyolysis, renal failure, intubated 4/20-4/21; Lt UE and LE nerve palsies due to pressure with blistering of Lt foot; all xrays negative for fracture PMHx-drug use; depression   OT comments  OT assessing mobility and adls this session and completing a splint check in addition to adl evaluation. Pt demonstrates decr edema in L UE and splint with a good fit at this time. OT recommending CIR for d/c planning due to cognitive and balance deficits. Pt will need to reach mod I level to d/c home with mother. Mother can provide (A) upon initial d/c home taking time off of work. OT to continue to follow acutely for splint management and adl retraining.    Follow Up Recommendations  CIR    Equipment Recommendations  Other (comment) (defer)    Recommendations for Other Services Rehab consult    Precautions / Restrictions Precautions Precautions: Fall       Mobility Bed Mobility Overal bed mobility: Needs Assistance Bed Mobility: Supine to Sit;Sit to Supine     Supine to sit: Supervision Sit to supine: Supervision   General bed mobility comments: able to exit L side of bed and dangle while OT adjust sheets. Pt weight shifting on R UE asking "does this help" . pt able to return to static sitting  Transfers                 General transfer comment: declined at this time due to fatigue    Balance Overall balance assessment: Needs assistance Sitting-balance support: No upper extremity supported;Feet supported Sitting balance-Leahy Scale: Normal                             ADL Overall ADL's : Needs assistance/impaired Eating/Feeding: Independent;Bed level Eating/Feeding Details (indicate cue type and reason): pt opening containers  and packets with L UE assistance on occassion but mainly R UE with mouth Grooming: Brushing hair;Independent;Bed level Grooming Details (indicate cue type and reason): OT pulling hair back into pony tail for patient                               General ADL Comments: Pt reports wanting to take a nap. pt agreeable to bed level mobility and sitting EOB to have therapist "fix" bed linens. Pt declined OOB this session. pt educated next session OT and pt woudl progress to chair level. Pt agreeable. pt moving from supine to EOB sitting supervision. Pt able to log roll R and L to adjust gown without (A) demonstrating ability to reach peri care and touching BIL LE at ankle to describe pain during session. Pt could complete a bed level bath min (A) at this time. Pt will need to complete basic transfer 1 person (A) and mod I to progress home with mother      Vision                     Perception     Praxis      Cognition   Behavior During Therapy: [facility] for tasks assessed/performed Overall Cognitive Status: Impaired/Different from baseline Area of Impairment: Memory;Problem solving     Memory: Decreased short-term memory  Problem Solving: Slow processing General Comments: Pt with splint removed and poor recall of morning events and why splint is removed. Pt with eyes have mask question due to medications. Pt reports severe pain with LLE however when visual attention not on LLE OT touching several times during session without response. Pt anxious and if provided visual attention to splint or tactile input does not jump in response to care from OT    Extremity/Trunk Assessment  Upper Extremity Assessment Upper Extremity Assessment: LUE deficits/detail LUE Deficits / Details: edema only present at wound site ( burn locations on dorsal aspect of forearm) splint doff on arrival. Pt with incr activation of digits able to complete digit flexion full Rom and extension digits  improved. Pt moving thumb to grab at opposites this session. Pt with some wrist activation noted. no dressing on wound.  LUE Coordination: decreased fine motor;decreased gross motor   Lower Extremity Assessment Lower Extremity Assessment: Defer to PT evaluation;LLE deficits/detail LLE Deficits / Details: Pt with noted L ankle foot drop. Pt verbalized pain with any tactile input. Pt could benefit from a prafo however unlikely to tolerate. Pt provided education on using a towel with R UE to pull toes back toward body throughout the day. Pt doing this independently and tolerating.    Cervical / Trunk Assessment Cervical / Trunk Assessment: Normal    Exercises Hand Exercises Wrist Extension: AROM;5 reps;Left Digit Composite Flexion: PROM;Left;10 reps Composite Extension: AROM;Left;10 reps;Seated Opposition: AROM;Left;10 reps;Seated   Shoulder Instructions       General Comments      Pertinent Vitals/ Pain       Pain Assessment: Faces Faces Pain Scale: Hurts whole lot Pain Location: LLE and all over Pain Descriptors / Indicators: Constant Pain Intervention(s): Monitored during session;Premedicated before session;Repositioned;PCA encouraged  Home Living Family/patient expects to be discharged to:: Private residence Living Arrangements: Parent Available Help at Discharge: Family;Available PRN/intermittently Type of Home: Mobile home Home Access: Stairs to enter Entrance Stairs-Number of Steps: 1 (large) Entrance Stairs-Rails: None Home Layout: One level     Bathroom Shower/Tub: Chief Strategy OfficerTub/shower unit   Bathroom Toilet: Standard     Home Equipment: Grab bars - tub/shower          Prior Functioning/Environment Level of Independence: Independent        Comments: her children are 16 & 17   Frequency Min 3X/week     Progress Toward Goals  OT Goals(current goals can now be found in the care plan section)  Progress towards OT goals: Progressing toward goals  Acute Rehab OT  Goals Patient Stated Goal: be able to walk   Plan Discharge plan remains appropriate    Co-evaluation                 End of Session     Activity Tolerance Patient limited by lethargy (pt reports extreme fatigue- very restless in bed )   Patient Left in bed;with call bell/phone within reach;with bed alarm set   Nurse Communication Mobility status;Precautions        Time: [id number] OT Time Calculation (min): 23 min  Charges: OT General Charges $OT Visit: 1 Procedure/ 1 procedure for check splint  OT Treatments $Self Care/Home Management : 8-22 mins $Orthotics/Prosthetics Check: 8-22 mins  Boone MasterJones, Hezakiah Champeau B 10/06/2014, 11:15 AM Pager: [phone]

## 2014-10-06 NOTE — Progress Notes (Signed)
After education, pt continues to remove foot dressing stating that her foot is getting swollen. RN informed pt, its best to keep dressing in place. However, pt still removes dressing. Will continue to monitor.

## 2014-10-06 NOTE — Progress Notes (Addendum)
[facility]                                                                              Patient Demographics  Charlotte Strong, is a 39 y.o. female, DOB - 14-Sep-1975, ZOX:[id number]  Admit date - 09/30/2014   Admitting Physician Kalman ShanMurali Ramaswamy, MD  Outpatient Primary MD for the patient is No PCP Per Patient  LOS - 6   Chief Complaint  Patient presents with  . Altered Mental Status       Brief HPI   Patient is a 39 year old female with right abuse, asthma who was admitted on 4/20 by pulmonary critical care for drug overdose on benzodiazepines and opioids, found to have rhabdomyolysis and respiratory failure. Patient was found by family in the bathroom floor at midnight, unresponsive and agonal breathing. EMS was called and did nasotracheal intubation. Patient was givennarcan with minimal response. She was intubated, with intubation patient became a little more responsive. Patient was found to have rhabdomyolysis with acute renal failure, lactic acidosis, CK 6000 with ATN. Patient had areas of induration on the left lower extremity, left gluteus, left wrist. Patient was admitted by critical care service to ICU. Orthopedics was consulted for possible fasciotomy. Patient was transferred to telemetry floor on 4/24, [facility]  Significant events 4/20>> Intubated and admitted to MICU 4/21>> Extubated 10/02/14: Patient complains of continued pain on left side specifically in her left foot, knee, hip and wrist. She is unable to move her left toes due to the pain. She admits to productive cough, but denies fever.     Assessment & Plan   Principal problem Severe sepsis with Escherichia coli UTI, aspiration pneumonia, cellulitis - Currently improving, patient has completed vancomycin, Levaquin, aztreonam, currently on Ceftin  - Increase IV fluid hydration, not eating too well, tachycardia  Active problems Acute rhabdomyolysis with metabolic  acidosis, hypokalemia - CK trending down, recheck CK in a.m.  Acute respiratory failure with Hypotension - Extubated on 4/21, currently O2 sats 95% on 2 L  Aspiration pneumonia, leukocytosis - Continue Ceftin, mobilization, incentive spirometry  Escherichia coli UTI Urine culture positive for Escherichia coli, continue Ceftin  Polysubstance abuse with drug overdose, likely has narcotic tolerance - Patient counseled on quitting drugs, - Continue fentanyl and Dilaudid PCA, Narcan when necessary  Reperfusion injury, radial nerve biopsy, cellulitis, tenosynovitis - PT, OT, gentle hydration, wound care, Silvadene cream, follow hand surgery outpatient  Code Status: Full code   Family Communication: Discussed in detail with the patient, all imaging results, lab results explained to the patient    Disposition Plan: Inpatient rehabilitation, consult placed, not ready today  Time Spent in minutes   25 minutes  Procedures  CT head/ c-spine 4/20>> neg acute  CXR 4/21>> NG tube tip projects over the upper thorax and should be repositioned/advanced. Left greater than right airspace opacities may reflect pneumonia and/or aspiration. L Ankle Xray 4/22>> Normal Left Femur Xray 4/22>> Normal Left Hip Xray 4/22>> No acute or subacute osseous abnormalities involving the pelvis. Symmetric normal-appearing joint spaces in both hips. Calcification adjacent to the superior left acetabular rim. Acetabular rim calcifications can  be due to labral calcification/ossification, acetabular rim stress fractures, or os acetabuli. Os acetabuli and acetabular rim stress fractures put the patient at increased risk for pincer type femoro-acetabular impingement. [place] Left Wrist 4/22>>Subcutaneous edema as well as edema in the soft tissues around the tendons in the distal forearm consistent with cellulitis and tenosynovitis. No defined abscesses.  Left Wrist Xray 4/22>> CPPD involving the triangular fibrocartilage  complex.  Consults   Orthopedics Critical care Wound care   DVT Prophylaxis   heparin Medications  Scheduled Meds: . antiseptic oral rinse  7 mL Mouth Rinse BID  . budesonide (PULMICORT) nebulizer solution  0.25 mg Nebulization BID  . cefUROXime  500 mg Oral BID WC  . docusate sodium  100 mg Oral BID  . heparin  5,000 Units Subcutaneous 3 times per day  . HYDROmorphone PCA 0.3 mg/mL   Intravenous 6 times per day  . ipratropium-albuterol  3 mL Nebulization BID  . phosphorus  500 mg Oral TID WC & HS  . potassium chloride  40 mEq Oral Daily  . silver sulfADIAZINE   Topical Daily   Continuous Infusions: . sodium chloride 100 mL/hr at 10/06/14 1406   PRN Meds:.albuterol, ALPRAZolam, fentaNYL (SUBLIMAZE) injection, guaiFENesin-codeine, ibuprofen, naloxone **AND** sodium chloride, ondansetron (ZOFRAN) IV, polyethylene glycol, sodium chloride   Antibiotics   Anti-infectives    Start     Dose/Rate Route Frequency Ordered Stop   10/04/14 1700  cefUROXime (CEFTIN) tablet 500 mg     500 mg Oral 2 times daily with meals 10/04/14 1244     10/02/14 1030  vancomycin (VANCOCIN) IVPB 750 mg/150 ml premix  Status:  Discontinued     750 mg 150 mL/hr over 60 Minutes Intravenous Every 12 hours 10/02/14 0957 10/03/14 0843   10/01/14 1600  levofloxacin (LEVAQUIN) IVPB 500 mg  Status:  Discontinued     500 mg 100 mL/hr over 60 Minutes Intravenous Every 48 hours 09/30/14 2219 10/01/14 1103   10/01/14 1600  aztreonam (AZACTAM) 1 g in dextrose 5 % 50 mL IVPB  Status:  Discontinued     1 g 100 mL/hr over 30 Minutes Intravenous 3 times per day 10/01/14 1233 10/04/14 1244   10/01/14 1500  vancomycin (VANCOCIN) 500 mg in sodium chloride 0.9 % 100 mL IVPB  Status:  Discontinued     500 mg 100 mL/hr over 60 Minutes Intravenous Every 24 hours 09/30/14 1753 10/02/14 0957   10/01/14 1500  levofloxacin (LEVAQUIN) IVPB 500 mg  Status:  Discontinued     500 mg 100 mL/hr over 60 Minutes Intravenous Every 24  hours 09/30/14 1753 09/30/14 2219   10/01/14 0000  aztreonam (AZACTAM) 1 g in dextrose 5 % 50 mL IVPB  Status:  Discontinued     1 g 100 mL/hr over 30 Minutes Intravenous 3 times per day 09/30/14 1753 10/01/14 0824   09/30/14 1445  aztreonam (AZACTAM) 2 g in dextrose 5 % 50 mL IVPB     2 g 100 mL/hr over 30 Minutes Intravenous  Once 09/30/14 1409 09/30/14 1610   09/30/14 1415  levofloxacin (LEVAQUIN) IVPB 750 mg     750 mg 100 mL/hr over 90 Minutes Intravenous  Once 09/30/14 1409 09/30/14 1757   09/30/14 1415  vancomycin (VANCOCIN) IVPB 1000 mg/200 mL premix     1,000 mg 200 mL/hr over 60 Minutes Intravenous  Once 09/30/14 1409 09/30/14 1537        Subjective:   Reve Mehlberg was seen and examined today. Complaining of  nausea, crying in pain while on PCA, very dramatic and moaning. When I explained to the patient that she has all the medications for nausea, PCA and fentanyl prn for pain ordered, patient states "I didn't know that". Reports pain in her left wrist and left leg. Patient denies dizziness, chest pain, shortness of breath, abdominal pain, N/V/D/C, new weakness, numbess, tingling. No acute events overnight.   Objective:   Blood pressure 123/69, pulse 112, temperature 98.3 F (36.8 C), temperature source Oral, resp. rate 16, height  (1.676 m), weight 65.8 kg (145 lb 1 oz), SpO2 92 %.  Wt Readings from Last 3 Encounters:  10/04/14 65.8 kg (145 lb 1 oz)  10/07/13 52.164 kg (115 lb)  07/01/13 58.968 kg (130 lb)     Intake/Output Summary (Last 24 hours) at 10/06/14 1411 Last data filed at 10/06/14 1133  Gross per 24 hour  Intake 3501.16 ml  Output   7350 ml  Net -3848.84 ml    Exam  General: Alert and oriented x 3, NAD, crying loudly  HEENT:  PERRLA, EOMI, Anicteic Sclera, mucous membranes moist.   Neck: Supple, no JVD, no masses  CVS: S1-S2 present, tachycardia, RRR  Respiratory: Scattered mild wheezing  Abdomen: Soft, NT, ND, NBS  Ext:  Left wrist  in splint and left lower leg dressing intact  Neuro: AAOx3, Cr N's II- XII. Strength 5/5 upper and lower extremities bilaterally  Skin: No rashes  Psych: Alert and oriented 3   Data Review   Micro Results Recent Results (from the past 240 hour(s))  Blood Culture (routine x 2)     Status: None   Collection Time: 09/30/14  2:15 PM  Result Value Ref Range Status   Specimen Description BLOOD RIGHT FOREARM  Final   Special Requests BOTTLES DRAWN AEROBIC ONLY 3CC  Final   Culture   Final    NO GROWTH 5 DAYS Performed at [facility]    Report Status 10/06/2014 FINAL  Final  Blood Culture (routine x 2)     Status: None   Collection Time: 09/30/14  2:34 PM  Result Value Ref Range Status   Specimen Description BLOOD LEFT HAND  Final   Special Requests BOTTLES DRAWN AEROBIC ONLY 3CC  Final   Culture   Final    NO GROWTH 5 DAYS Performed at [facility]    Report Status 10/06/2014 FINAL  Final  Urine culture     Status: None   Collection Time: 09/30/14  2:54 PM  Result Value Ref Range Status   Specimen Description URINE, CATHETERIZED  Final   Special Requests NONE  Final   Colony Count   Final    >=100,000 COLONIES/ML Performed at [facility]    Culture   Final    ESCHERICHIA COLI Performed at [facility]    Report Status 10/03/2014 FINAL  Final   Organism ID, Bacteria ESCHERICHIA COLI  Final      Susceptibility   Escherichia coli - MIC*    AMPICILLIN >=32 RESISTANT Resistant     CEFAZOLIN <=4 SENSITIVE Sensitive     CEFTRIAXONE <=1 SENSITIVE Sensitive     CIPROFLOXACIN 1 SENSITIVE Sensitive     GENTAMICIN >=16 RESISTANT Resistant     LEVOFLOXACIN 1 SENSITIVE Sensitive     NITROFURANTOIN <=16 SENSITIVE Sensitive     TOBRAMYCIN 8 INTERMEDIATE Intermediate     TRIMETH/SULFA >=320 RESISTANT Resistant     PIP/TAZO <=4 SENSITIVE Sensitive     *  ESCHERICHIA COLI  MRSA PCR Screening     Status: None   Collection Time: 09/30/14  5:55 PM    Result Value Ref Range Status   MRSA by PCR NEGATIVE NEGATIVE Final    Comment:        The GeneXpert MRSA Assay (FDA approved for NASAL specimens only), is one component of a comprehensive MRSA colonization surveillance program. It is not intended to diagnose MRSA infection nor to guide or monitor treatment for MRSA infections.   Culture, blood (routine x 2)     Status: None (Preliminary result)   Collection Time: 10/04/14  4:10 PM  Result Value Ref Range Status   Specimen Description BLOOD CENTRAL LINE  Final   Special Requests BOTTLES DRAWN AEROBIC AND ANAEROBIC 10 CC  Final   Culture   Final           BLOOD CULTURE RECEIVED NO GROWTH TO DATE CULTURE WILL BE HELD FOR 5 DAYS BEFORE ISSUING A FINAL NEGATIVE REPORT Performed at [facility]    Report Status PENDING  Incomplete    Radiology Reports Dg Pelvis 1-2 Views  10/02/2014   CLINICAL DATA:  Acute onset left hip pain radiating into the left leg. No known injury.  EXAM: PELVIS - 1-2 VIEW  COMPARISON:  Left femur x-rays obtained concurrently. Bone window images from CT abdomen and pelvis 10/06/2009.  FINDINGS: No evidence of acute fracture. Hip joints intact with symmetric well preserved joint spaces. Calcification adjacent to the left acetabular rim superiorly. Sacroiliac joints and symphysis pubis intact without evidence of diastasis or significant degenerative change. Visualized lower lumbar spine intact. Well preserved bone mineral density. No intrinsic osseous abnormality.  IMPRESSION: 1. No acute or subacute osseous abnormalities involving the pelvis. 2. Symmetric normal-appearing joint spaces in both hips. 3. Calcification adjacent to the superior left acetabular rim. Acetabular rim calcifications can be due to labral calcification/ossification, acetabular rim stress fractures, or os acetabuli. Os acetabuli and acetabular rim stress fractures put the patient at increased risk for pincer type femoroacetabular impingement.    Electronically Signed   By: Hulan Saas M.D.   On: 10/02/2014 20:46   Dg Wrist Complete Left  10/02/2014   CLINICAL DATA:  Acute onset of left wrist pain and swelling. No known injury.  EXAM: LEFT WRIST - COMPLETE 3+ VIEW  COMPARISON:  None.  FINDINGS: No evidence of acute, subacute or healed fractures. Well preserved joint spaces. Well preserved bone mineral density. Faint calcification involving the triangular fibrocartilage complex.  IMPRESSION: No acute or subacute osseous abnormality. CPPD involving the triangular fibrocartilage complex.   Electronically Signed   By: Hulan Saas M.D.   On: 10/02/2014 20:44   Dg Ankle Complete Left  10/02/2014   CLINICAL DATA:  Acute onset left ankle pain and swelling. No known injury.  EXAM: LEFT ANKLE COMPLETE - 3+ VIEW  COMPARISON:  None.  FINDINGS: No evidence of acute fracture. Ankle mortise intact with well-preserved joint space. Well-preserved bone mineral density. No intrinsic osseous abnormalities. No visible joint effusion.  IMPRESSION: Normal examination.   Electronically Signed   By: Hulan Saas M.D.   On: 10/02/2014 20:46   Ct Head Wo Contrast  09/30/2014   CLINICAL DATA:  39 year old female with altered mental status with possible drug overdose.  EXAM: CT HEAD WITHOUT CONTRAST  CT CERVICAL SPINE WITHOUT CONTRAST  TECHNIQUE: Multidetector CT imaging of the head and cervical spine was performed following the standard protocol without intravenous contrast. Multiplanar CT image reconstructions  of the cervical spine were also generated.  COMPARISON:  Prior CT scan of the head and cervical spine 06/20/2011  FINDINGS: CT HEAD FINDINGS  Insert tonight via perhaps mild soft tissue contusion left temporal scalp just above the ear. No underlying calvarial injury. Mastoid air cells and paranasal sinuses are well aerated. Orbits and globes are intact and symmetric bilaterally.  CT CERVICAL SPINE FINDINGS  No acute fracture, malalignment or prevertebral  soft tissue swelling. Unremarkable CT appearance of the thyroid gland. No acute soft tissue abnormality. Biapical pleural parenchymal scarring. The patient is intubated and a nasogastric tube is present. Both tubes are incompletely imaged.  IMPRESSION: CT HEAD  1. Negative CT CSPINE  1. Negative   Electronically Signed   By: Malachy Moan M.D.   On: 09/30/2014 15:33   Ct Cervical Spine Wo Contrast  09/30/2014   CLINICAL DATA:  39 year old female with altered mental status with possible drug overdose.  EXAM: CT HEAD WITHOUT CONTRAST  CT CERVICAL SPINE WITHOUT CONTRAST  TECHNIQUE: Multidetector CT imaging of the head and cervical spine was performed following the standard protocol without intravenous contrast. Multiplanar CT image reconstructions of the cervical spine were also generated.  COMPARISON:  Prior CT scan of the head and cervical spine 06/20/2011  FINDINGS: CT HEAD FINDINGS  Insert tonight via perhaps mild soft tissue contusion left temporal scalp just above the ear. No underlying calvarial injury. Mastoid air cells and paranasal sinuses are well aerated. Orbits and globes are intact and symmetric bilaterally.  CT CERVICAL SPINE FINDINGS  No acute fracture, malalignment or prevertebral soft tissue swelling. Unremarkable CT appearance of the thyroid gland. No acute soft tissue abnormality. Biapical pleural parenchymal scarring. The patient is intubated and a nasogastric tube is present. Both tubes are incompletely imaged.  IMPRESSION: CT HEAD  1. Negative CT CSPINE  1. Negative   Electronically Signed   By: Malachy Moan M.D.   On: 09/30/2014 15:33   [place] Extrem Up Left Ltd  10/02/2014   CLINICAL DATA:  Edema in the left hand and wrist.  EXAM: ULTRASOUND OF THE LEFT HAND AND WRIST  LIMITED  TECHNIQUE: Ultrasound examination of the upper extremity soft tissues was performed in the area of clinical concern.  COMPARISON:  Right hand and wrist was used for comparison.  FINDINGS: There is subcutaneous  edema without a definable abscess. There is also edema around the tendons in the distal forearm. No definable abscess.  IMPRESSION: Subcutaneous edema as well as edema in the soft tissues around the tendons in the distal forearm consistent with cellulitis and tenosynovitis. No defined abscesses.   Electronically Signed   By: Francene Boyers M.D.   On: 10/02/2014 18:54   Dg Chest Port 1 View  10/01/2014   CLINICAL DATA:  Dyspnea and worsening shortness of breath.  EXAM: PORTABLE CHEST - 1 VIEW  COMPARISON:  10/01/2014  FINDINGS: Multifocal airspace opacities, increased and most pronounced left perihilar and retrocardiac, right upper lobe/ apex. No overt effusion. No pneumothorax. Cardiomediastinal contours within normal range. Interval extubation. No acute osseous finding. Surgical clips right upper quadrant.  IMPRESSION: Increased bilateral airspace opacities, may reflect multifocal pneumonia and/or edema.  Recommend short-term radiograph follow-up to document resolution.   Electronically Signed   By: Jearld Lesch M.D.   On: 10/01/2014 21:29   Dg Chest Port 1 View  10/01/2014   CLINICAL DATA:  Aspiration pneumonia, shortness of breath, intubated.  EXAM: PORTABLE CHEST - 1 VIEW  COMPARISON:  09/30/2014  FINDINGS:  Endotracheal tube tip projects 3.3 cm proximal to the carina. NG tube tip projects over the upper thorax and may be within the proximal/mid esophagus. Multifocal airspace opacities, most pronounced within the left lower lung. No pleural effusion or pneumothorax. No acute osseous finding.  IMPRESSION: NG tube tip projects over the upper thorax and should be repositioned/advanced. Discussed via telephone with the patient's nurse, Roe Coombs, at 6:08 a.m. on 10/01/2014.  Left greater than right airspace opacities may reflect pneumonia and/or aspiration.   Electronically Signed   By: Jearld Lesch M.D.   On: 10/01/2014 06:11   Dg Chest Port 1 View  09/30/2014   CLINICAL DATA:  Or dental status.  EXAM:  PORTABLE CHEST - 1 VIEW  COMPARISON:  09/27/2013 head CT scan dated 10/07/2013  FINDINGS: Endotracheal tube and OG tube within inserted in appear in good position. Is a left perihilar infiltrate extending into the upper lobe and a faint patchy infiltrate at the left lung base. There is also suggestion of infiltrate/ atelectasis in the right upper lobe medially. Right mid and lower lung zones are clear. Heart size and vascularity are normal. No osseous abnormality.  IMPRESSION: Bilateral infiltrates,/ atelectasis, mainly on the left. The possibility of aspiration pneumonitis should be considered .   Electronically Signed   By: Francene Boyers M.D.   On: 09/30/2014 14:50   Dg Femur Min 2 Views Left  10/02/2014   CLINICAL DATA:  Acute onset left hip pain radiating into the left leg. No known injuries.  EXAM: LEFT FEMUR 2 VIEWS  COMPARISON:  None.  FINDINGS: No evidence of acute, subacute or healed fractures. Well preserved bone mineral density. No intrinsic osseous abnormality involving the femur. Visualized hip joint intact though there is a small calcification adjacent to the acetabular rim superiorly. Visualized knee joint intact.  IMPRESSION: 1. Normal-appearing left femur. 2. Small calcification adjacent to the superior acetabular rim. Acetabular rim calcifications can be due to labral calcification/ossification, acetabular rim stress fractures, or os acetabuli. Os acetabuli and acetabular rim stress fractures put the patient at increased risk for pincer type femoroacetabular impingement.   Electronically Signed   By: Hulan Saas M.D.   On: 10/02/2014 20:42    CBC  Recent Labs Lab 09/30/14 1415 09/30/14 1906 10/01/14 0421 10/02/14 0533 10/04/14 0230 10/05/14 0910 10/06/14 0611  WBC 19.1* 12.6* 9.7 10.4 9.1 10.8* 8.8  HGB 14.1 13.7 12.8 12.4 10.1* 10.7* 10.5*  HCT 44.7 41.1 38.5 36.7 29.8* 31.7* 30.8*  PLT 340 244 236 185 158 168 189  MCV 98.5 96.0 93.9 93.6 92.0 91.9 91.7  MCH 31.1 32.0  31.2 31.6 31.8 31.0 31.3  MCHC 31.5 33.3 33.2 33.8 34.6 33.8 34.1  RDW 14.4 14.4 14.4 14.6 14.4 14.4 14.6  LYMPHSABS 1.0 0.9  --   --  1.1 1.0 1.8  MONOABS 1.8* 0.8  --   --  0.7 0.9 1.1*  EOSABS 0.0 0.0  --   --  0.1 0.3 0.4  BASOSABS 0.0 0.0  --   --  0.0 0.0 0.0    Chemistries   Recent Labs Lab 09/30/14 1415  09/30/14 1906  10/02/14 0533 10/03/14 0241 10/04/14 0630 10/05/14 0910 10/06/14 0611  NA 138  < > 139  < > 142 139 137 137 140  K 6.5*  < > 5.3*  < > 3.7 2.7* 2.8* 3.3* 3.8  CL 101  < > 113*  < > 105 102 104 102 102  CO2 18*  < > 16*  < >  28 30 24 28 29   GLUCOSE 127*  < > 165*  < > 100* 148* 148* 128* 94  BUN 34*  < > 29*  < > 8 <5* <5* <5* 6  CREATININE 2.93*  < > 1.78*  < > 0.99 0.79 0.72 0.58 0.65  CALCIUM 8.2*  < > 6.2*  < > 7.5* 7.5* 8.0* 8.1* 8.6  MG  --   --   --   --   --   --  1.5 1.8 1.9  AST 174*  --  307*  --   --  571* 362*  --   --   ALT 85*  --  109*  --   --  227* 214*  --   --   ALKPHOS 111  --  86  --   --  81 89  --   --   BILITOT 0.8  --  0.8  --   --  0.7 0.6  --   --   < > = values in this interval not displayed. ------------------------------------------------------------------------------------------------------------------ estimated creatinine clearance is 91 mL/min (by C-G formula based on Cr of 0.65). ------------------------------------------------------------------------------------------------------------------ No results for input(s): HGBA1C in the last 72 hours. ------------------------------------------------------------------------------------------------------------------ No results for input(s): CHOL, HDL, LDLCALC, TRIG, CHOLHDL, LDLDIRECT in the last 72 hours. ------------------------------------------------------------------------------------------------------------------ No results for input(s): TSH, T4TOTAL, T3FREE, THYROIDAB in the last 72 hours.  Invalid input(s):  FREET3 ------------------------------------------------------------------------------------------------------------------ No results for input(s): VITAMINB12, FOLATE, FERRITIN, TIBC, IRON, RETICCTPCT in the last 72 hours.  Coagulation profile  Recent Labs Lab 10/03/14 0844  INR 1.38    No results for input(s): DDIMER in the last 72 hours.  Cardiac Enzymes  Recent Labs Lab 10/01/14 0421 10/03/14 0844 10/04/14 0630  CKMB >300.0* 17.0* 8.8*   ------------------------------------------------------------------------------------------------------------------ Invalid input(s): POCBNP  No results for input(s): GLUCAP in the last 72 hours.   Antario Yasuda M.D. [facility] 10/06/2014, 2:11 PM  Pager: [phone]   Between 7am to 7pm - call Pager - [phone]  After 7pm go to [email or web address] - password [email or web address]  Call night coverage person covering after 7pm

## 2014-10-06 NOTE — Progress Notes (Signed)
Physical Therapy Treatment Patient Details Name: Charlotte Strong MRN: [id number] DOB: [birth year] Today's Date: 10/06/2014    History of Present Illness Adm 4/20 s/p OD (found on floor after ?48 hours) with rhabdomyolysis, renal failure, intubated 4/20-4/21; Lt UE and LE nerve palsies due to pressure with blistering of Lt foot; all xrays negative for fracture PMHx-drug use; depression    PT Comments    Progressing with ambulation, though still poor tolerance to full weight on left LE.  Not yet ready for AFO due to hypersensitivity.  Will need one hopefully can be fitted on CIR.  Follow Up Recommendations  CIR;Supervision/Assistance - 24 hour     Equipment Recommendations  Other (comment);Rolling walker with 5" wheels (with left UE platform)    Recommendations for Other Services       Precautions / Restrictions Precautions Precautions: Fall    Mobility  Bed Mobility Overal bed mobility: Needs Assistance       Supine to sit: Supervision     General bed mobility comments: for safety with lines, etc  Transfers Overall transfer level: Needs assistance Equipment used: Rolling walker (2 wheeled) Transfers: Sit to/from Stand Sit to Stand: Min assist         General transfer comment: assist for safety with lines, left UE platform on walker  Ambulation/Gait Ambulation/Gait assistance: Min assist;Min guard Ambulation Distance (Feet): 12 Feet Assistive device: Rolling walker (2 wheeled) Gait Pattern/deviations: Step-to pattern;Decreased dorsiflexion - left;Decreased stride length;Steppage;Antalgic     General Gait Details: assist and cue to stabilize left hip in stance phase   Stairs            Wheelchair Mobility    Modified Rankin (Stroke Patients Only)       Balance Overall balance assessment: Needs assistance         Standing balance support: Bilateral upper extremity supported Standing balance-Leahy Scale: Poor Standing balance comment: minguard  for safety with UE support                    Cognition Arousal/Alertness: Awake/alert Behavior During Therapy: Anxious   Area of Impairment: Memory;Problem solving     Memory: Decreased short-term memory       Problem Solving: Requires verbal cues;Slow processing      Exercises Other Exercises Other Exercises: attempted left heel cord stretch poorly tolerated    General Comments General comments (skin integrity, edema, etc.): applied resting hand splint at end of session      Pertinent Vitals/Pain Faces Pain Scale: Hurts whole lot Pain Location: left LE/UE Pain Descriptors / Indicators: Burning Pain Intervention(s): PCA encouraged;Monitored during session;Limited activity within patient's tolerance    Home Living                      Prior Function            PT Goals (current goals can now be found in the care plan section) Progress towards PT goals: Progressing toward goals    Frequency  Min 4X/week    PT Plan Current plan remains appropriate    Co-evaluation             End of Session Equipment Utilized During Treatment: Gait belt;Oxygen Activity Tolerance: Patient tolerated treatment well Patient left: in chair;with call bell/phone within reach     Time: 1315-1338 PT Time Calculation (min) (ACUTE ONLY): 23 min  Charges:  $Gait Training: 8-22 mins $Therapeutic Activity: 8-22 mins  G Codes:      Anajah Sterbenz,CYNDI 10/06/2014, 3:00 PM  .Sheran Lawlessyndi Dynesha Woolen, PT [phone] 10/06/2014

## 2014-10-06 NOTE — Progress Notes (Signed)
Pt has had no BM since 4/19. MD notified and made aware.

## 2014-10-06 NOTE — Progress Notes (Signed)
Inpatient Rehabilitation  I had a lengthy discussion with pt. regarding her post acute rehabilitation needs and possible CIR admission.  Pt. States she and her mom want her to come to IP Rehab here if possible. Pt. Currently still on PCA.  Will need to be off PCA for possibility of CIR .  Also , I have advised pt. , Letha Capeeborah Taylor CM and Macario GoldsJesse Scinto SW of need for back up plan of other CIR vs SNF due to tight bed availability anticipated this week for our IP Rehab center. I attempted to reach pt's mother Abel PrestoCarol Drury to discuss, however per pt., she cannot answer her phone while at work. Pt. to have her mom call me for an update.   I will follow for medical readiness and bed availability for possible IP Rehab admission. Please call if questions.  Weldon PickingSusan Anntonette Madewell PT Inpatient Rehab Admissions Coordinator Cell [phone] Office [phone]

## 2014-10-07 DIAGNOSIS — M792 Neuralgia and neuritis, unspecified: Secondary | ICD-10-CM | POA: Insufficient documentation

## 2014-10-07 DIAGNOSIS — Z515 Encounter for palliative care: Secondary | ICD-10-CM | POA: Insufficient documentation

## 2014-10-07 DIAGNOSIS — F418 Other specified anxiety disorders: Secondary | ICD-10-CM

## 2014-10-07 LAB — CBC WITH DIFFERENTIAL/PLATELET
BASOS ABS: 0 10*3/uL (ref 0.0–0.1)
Basophils Relative: 0 % (ref 0–1)
Eosinophils Absolute: 0.3 10*3/uL (ref 0.0–0.7)
Eosinophils Relative: 4 % (ref 0–5)
HCT: 28.4 % — ABNORMAL LOW (ref 36.0–46.0)
Hemoglobin: 9.5 g/dL — ABNORMAL LOW (ref 12.0–15.0)
Lymphocytes Relative: 15 % (ref 12–46)
Lymphs Abs: 1.4 10*3/uL (ref 0.7–4.0)
MCH: 30.6 pg (ref 26.0–34.0)
MCHC: 33.5 g/dL (ref 30.0–36.0)
MCV: 91.6 fL (ref 78.0–100.0)
MONOS PCT: 11 % (ref 3–12)
Monocytes Absolute: 1 10*3/uL (ref 0.1–1.0)
NEUTROS ABS: 6.6 10*3/uL (ref 1.7–7.7)
NEUTROS PCT: 70 % (ref 43–77)
Platelets: 218 10*3/uL (ref 150–400)
RBC: 3.1 MIL/uL — ABNORMAL LOW (ref 3.87–5.11)
RDW: 14.5 % (ref 11.5–15.5)
WBC: 9.4 10*3/uL (ref 4.0–10.5)

## 2014-10-07 LAB — BASIC METABOLIC PANEL
ANION GAP: 7 (ref 5–15)
BUN: <5 mg/dL — ABNORMAL LOW (ref 6–23)
CALCIUM: 8.2 mg/dL — AB (ref 8.4–10.5)
CO2: 30 mmol/L (ref 19–32)
CREATININE: 0.63 mg/dL (ref 0.50–1.10)
Chloride: 99 mmol/L (ref 96–112)
GFR calc Af Amer: >90 mL/min (ref >90)
Glucose, Bld: 110 mg/dL — ABNORMAL HIGH (ref 70–99)
Potassium: 3.7 mmol/L (ref 3.5–5.1)
Sodium: 136 mmol/L (ref 135–145)

## 2014-10-07 LAB — PHOSPHORUS: Phosphorus: 4 mg/dL (ref 2.3–4.6)

## 2014-10-07 LAB — MAGNESIUM: MAGNESIUM: 1.8 mg/dL (ref 1.5–2.5)

## 2014-10-07 MED ORDER — GABAPENTIN 100 MG PO CAPS
200.0000 mg | ORAL_CAPSULE | Freq: Three times a day (TID) | ORAL | Status: DC
  Administered 2014-10-07 – 2014-10-08 (×4): 200 mg via ORAL
  Filled 2014-10-07 (×5): qty 2

## 2014-10-07 MED ORDER — HYDROMORPHONE HCL 1 MG/ML IJ SOLN
1.0000 mg | INTRAMUSCULAR | Status: DC | PRN
  Administered 2014-10-07 – 2014-10-08 (×3): 1 mg via INTRAVENOUS
  Filled 2014-10-07 (×3): qty 1

## 2014-10-07 MED ORDER — OXYCODONE HCL 5 MG PO TABS
5.0000 mg | ORAL_TABLET | ORAL | Status: DC | PRN
  Administered 2014-10-07 – 2014-10-08 (×2): 10 mg via ORAL
  Filled 2014-10-07 (×2): qty 2

## 2014-10-07 MED ORDER — DULOXETINE HCL 30 MG PO CPEP
30.0000 mg | ORAL_CAPSULE | Freq: Every day | ORAL | Status: DC
  Administered 2014-10-07 – 2014-10-08 (×2): 30 mg via ORAL
  Filled 2014-10-07 (×2): qty 1

## 2014-10-07 NOTE — Progress Notes (Addendum)
Occupational Therapy Treatment/ Splint documentation Patient Details Name: Charlotte Strong MRN: [id number] DOB: February 04, 1976 Today's Date: 10/07/2014    History of present illness Adm 4/20 s/p OD (found on floor after ?48 hours) with rhabdomyolysis, renal failure, intubated 4/20-4/21; Lt UE and LE nerve palsies due to pressure with blistering of Lt foot; all xrays negative for fracture PMHx-drug use; depression   OT comments  Pt demonstrates bathing bed level, splint care occurred ( dressing applied / splint don- pt has d/ced the splint x2 nights now), dressing placed on L foot wound, transferred to 3n1 and transferred to chair for breakfast. Pt educated on the need to incr mobility so she can d/c home with mother and to demonstrate potential for CIR. OT spoke with MD regarding PCA and fixed on pain management by patient. MD to address oral medications and pt made aware by OT that doctors are going to try to give oral medication that last longer than the PCA button. Pt states " i can take stuff by mouth. I need something!"  Pt remains an excellent CIR candidate to incr balance with adls reaching mod I level to d/c home with mother.    Follow Up Recommendations  CIR    Equipment Recommendations  Other (comment)    Recommendations for Other Services Rehab consult    Precautions / Restrictions Precautions Precautions: Fall Precaution Comments: Picc line R arm, splint L UE Required Braces or Orthoses: Other Brace/Splint Other Brace/Splint: Splint L UE resting hand       Mobility Bed Mobility Overal bed mobility: Needs Assistance Bed Mobility: Supine to Sit     Supine to sit: Supervision     General bed mobility comments: safety and lines and leads. Pt impulsive and pulling HR monitor out of gown pocket after therapist untangled lines and placed correctly. Ot readjusting HR montior again into correct position for safety  Transfers Overall transfer level: Needs assistance Equipment  used: Left platform walker Transfers: Sit to/from Stand;Stand Pivot Transfers Sit to Stand: Min assist Stand pivot transfers: Min assist       General transfer comment: (A) for lines and leads, mod v/c for safety and sequence    Balance Overall balance assessment: Needs assistance Sitting-balance support: No upper extremity supported;Feet supported Sitting balance-Leahy Scale: Normal     Standing balance support: Bilateral upper extremity supported;During functional activity Standing balance-Leahy Scale: Poor Standing balance comment: Pt placing all weight on R LE. Pt wtih Yellow sock placed on R LE and Blue on L LE. using the visual cue . Stand on the Yellow sock helped pt follow sequence and static stand in RW with platform for 1 min                   ADL Overall ADL's : Needs assistance/impaired Eating/Feeding: Set up;Sitting Eating/Feeding Details (indicate cue type and reason): chair (A) to open containers due to splint don Grooming: Wash/dry face;Set up;Bed level   Upper Body Bathing: Minimal assitance;Bed level Upper Body Bathing Details (indicate cue type and reason): washing up and moving lines appropriately.  Lower Body Bathing: Minimal assistance;Bed level Lower Body Bathing Details (indicate cue type and reason): log rolling R and L to complete LB bathing and peri care. Pt reports pain with peri care. "oh that cather... i have some blood" Upper Body Dressing : Minimal assistance;Bed level   Lower Body Dressing: Maximal assistance;Bed level Lower Body Dressing Details (indicate cue type and reason): (A) to don socks due to splint and L  LE edema Toilet Transfer: Minimal assistance;Stand-pivot;RW Toilet Transfer Details (indicate cue type and reason): Pt able to hop on R LE to 3n1 and void bladder Toileting- Clothing Manipulation and Hygiene: Min guard;Sitting/lateral lean       Functional mobility during ADLs: Minimal assistance;Rolling walker General ADL  Comments: Pt completed supine to sit EOB supervision level . Pt completed full adl at bed level. Pt completed bed to 3n1 adn 3n1 to chair this session. Pt demonstrates decr oxygen saturation to 81% with mobility. pt fatigues quickly and reports pain in L LE after mobility. Pt demonstrates incr grasp this session compared to previous. Pt presented with cup and worked on Naval architect memory reaching for cup with correct form . Pt became tearful and states "now you are going to make me cry" Pt only abel to complete 5 reps then becoming liable. Pt educated on the need to practice and motor memory. Pt completes digit flexion full ROM. Pt demonstrates deficits with DIP and PIP extension      Vision                     Perception     Praxis      Cognition   Behavior During Therapy: Anxious;Restless Overall Cognitive Status: Impaired/Different from baseline Area of Impairment: Memory;Problem solving     Memory: Decreased short-term memory        Problem Solving: Slow processing General Comments: Pt very jumpy at any new sounds. Pt states "I am sorry" have each startle. pt reports to questioning she only startles when she is sick. Pt fixated on pain medication and demonstrates ability to attempt to restart PCA by pushing the correct sequence on IV pump. Pt with sudden onset of pain even without mobility or movement    Extremity/Trunk Assessment               Exercises Other Exercises Other Exercises: see above. Pt reports completing L ankle stretch in PM however uncertain if AAROM occurred   Shoulder Instructions       General Comments      Pertinent Vitals/ Pain       Pain Assessment: Faces Faces Pain Scale: Hurts whole lot Pain Location: L UE  L LE Pain Descriptors / Indicators: Pins and needles Pain Intervention(s): Monitored during session;Premedicated before session;Repositioned;PCA encouraged  Home Living                                           Prior Functioning/Environment              Frequency Min 3X/week     Progress Toward Goals  OT Goals(current goals can now be found in the care plan section)  Progress towards OT goals: Progressing toward goals  Acute Rehab OT Goals Patient Stated Goal: be able to walk  ADL Goals Pt Will Perform Grooming: with set-up;sitting Pt Will Perform Upper Body Bathing: with set-up;sitting Pt Will Perform Lower Body Bathing: with min guard assist;sit to/from stand Pt Will Transfer to Toilet: with min assist;bedside commode;ambulating Pt Will Perform Toileting - Clothing Manipulation and hygiene: with min guard assist;sitting/lateral leans Additional ADL Goal #1: Pt will don doff splint MOD I Additional ADL Goal #2: Pt will complete basic transfer with platform walker min (A)  (MET)  Plan Discharge plan remains appropriate    Co-evaluation  End of Session Equipment Utilized During Treatment: Rolling walker   Activity Tolerance Patient tolerated treatment well   Patient Left in chair;with call bell/phone within reach (RN aware patient in chair)   Nurse Communication Mobility status;Precautions      SPLINT   L resting hand splint checks at this time and no redness, swelling or pain noted. Splint reapplied correctly and elevated on pillows/ towels in proper alignment.   Edema not present at digits or wrist. Edema present at forearm wound only. Dressing doff on arrival and reapplied by OT during session  OT to continue to follow acutely and monitor splint.  RN staff please notify OT staff immediately if you notice any changes [phone]    Time: [phone] OT Time Calculation (min): 50 min  Charges: OT General Charges $OT Visit: 1 Procedure/ 1 procedure splint OT Treatments $Self Care/Home Management : 23-30mns Splint fit 8-22 mins  JParke PoissonB 10/07/2014, 9:05 AM  Pager: [phone]

## 2014-10-07 NOTE — Progress Notes (Signed)
Left Message for Dorene SorrowJerry, inpatient coordinator for [facility] for possible referral
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
## 2014-10-07 NOTE — Clinical Social Work Note (Addendum)
Clinical Social Work Assessment  Patient Details  Name: Charlotte Strong MRN: [id number] Date of Birth: 09-Jul-1975  Date of referral:  10/07/14               Reason for consult:  Discharge Planning                Permission sought to share information with:  Facility Industrial/product designerContact Representative Permission granted to share information::  Yes, Verbal Permission Granted  Name::        Agency::     Relationship::     Contact Information:     Housing/Transportation Living arrangements for the past 2 months:  Single Family Home Source of Information:  Patient, Parent Patient Interpreter Needed:  None Criminal Activity/Legal Involvement Pertinent to Current Situation/Hospitalization:  No - Comment as needed Significant Relationships:  Parents Lives with:  Parents Do you feel safe going back to the place where you live?  Yes Need for family participation in patient care:  Yes (Comment)  Care giving concerns:  Both patient and mother feel that patient will need continued physical therapy at discharge as patient's needs are currently to much to be managed at home.    Social Worker assessment / plan: Patient admitted for acute respiratory failure in the setting of OD on benzodiazepines and opiates. Patient lives with her mother. Both patient and mother plan for a discharge to CIR, but understand that bed availability may be an issue for this plan. Both patient and mother are agreeable to short term LOG placement if CIR is not an option, but both prefer CIR. CSW explained SNF search/placement process and answered questions.   Employment status:  Unemployed Health and safety inspectornsurance information:  Self Pay (Medicaid Pending) PT Recommendations:  Skilled Nursing Facility Information / Referral to community resources:  Skilled Nursing Facility, Acute Rehab  Patient/Family's Response to care:  Patient appears to be in pain as she continues to cry and grimace. Both patient and mom do share that they appreciatie everything  that has been done to help the patient.  Patient/Family's Understanding of and Emotional Response to Diagnosis, Current Treatment, and Prognosis:  Both patient and mother appear to have good insight into reason for admission. Patient and mom seem relieved by CSWs explanation of alternative to CIR, but continue to state that CIR is their preferred disposition.   Emotional Assessment Appearance:  Appears stated age Attitude/Demeanor/Rapport:  Crying, Apprehensive Affect (typically observed):  Anxious, Restless, Tearful/Crying Orientation:  Oriented to Self, Oriented to Place, Oriented to  Time Alcohol / Substance use:  Illicit Drugs, Tobacco Use, Other (Benzos, opiates, THC) Psych involvement (Current and /or in the community):  Yes (Comment) CORRECTION: Psychiatry is not yet involved.   Discharge Needs  Concerns to be addressed:  Discharge planning needs, Substance Abuse Readmission within the last 30 days:  No Current discharge risk:  Substance Abuse, Physical Impairment Barriers to Discharge:  Continued Medical Work up   The KrogerBryant Magdalynn Davilla MSW, [place]CSWA, [place], [phone]

## 2014-10-07 NOTE — Clinical Social Work Placement (Signed)
   CLINICAL SOCIAL WORK PLACEMENT  NOTE  Date:  10/07/2014  Patient Details  Name: Renato GailsCrystal Schroader MRN: [id number] Date of Birth: 1976-05-10  Clinical Social Work is seeking post-discharge placement for this patient at the Skilled  Nursing Facility level of care (*CSW will initial, date and re-position this form in  chart as items are completed):  Yes   Patient/family provided with [facility] Clinical Social Work Department's list of facilities offering this level of care within the geographic area requested by the patient (or if unable, by the patient's family).  Yes   Patient/family informed of their freedom to choose among providers that offer the needed level of care, that participate in Medicare, Medicaid or managed care program needed by the patient, have an available bed and are willing to accept the patient.  Yes   Patient/family informed of [facility]'s ownership interest in [facility] and [facility], as well as of the fact that they are under no obligation to receive care at these facilities.  PASRR submitted to EDS on 10/07/14     PASRR number received on       Existing PASRR number confirmed on       FL2 transmitted to all facilities in geographic area requested by pt/family on 10/07/14     FL2 transmitted to all facilities within larger geographic area on 10/07/14     Patient informed that his/her managed care company has contracts with or will negotiate with certain facilities, including the following:            Patient/family informed of bed offers received.  Patient chooses bed at       Physician recommends and patient chooses bed at      Patient to be transferred to   on  .  Patient to be transferred to facility by       Patient family notified on   of transfer.  Name of family member notified:        PHYSICIAN       Additional Comment:    _______________________________________________ Roddie McBryant Ciaran Begay MSW, [place] Creek[place]CSWA, [place], [phone]

## 2014-10-07 NOTE — Progress Notes (Signed)
Inpatient Rehabilitation  I continue to follow for medical readiness for possible IP Rehab admission pending bed availability.  Dr. Benjamine MolaVann indicates she will be addressing pt's pain issues and PCA pump.  Pt. Presents tearful, frustrated this am due to L LE pain.  I note that L LE is edematous today, and pt. States she believes it is more swollen than yesterday.  I have notified Arline Aspindy, pt's RN .  She is to discuss with Dr. Benjamine MolaVann.  Ottie GlazierBarbara Boyette [phone]) will follow up tomorrow.  Please call if questions.  Weldon PickingSusan Zerek Litsey PT Inpatient Rehab Admissions Coordinator Cell [phone] Office [phone]

## 2014-10-07 NOTE — Progress Notes (Signed)
[facility]                                                                              Patient Demographics  Charlotte Strong, is a 39 y.o. female, DOB - Aug 06, 1975, ZOX:[id number]  Admit date - 09/30/2014   Admitting Physician Kalman Shan, MD  Outpatient Primary MD for the patient is No PCP Per Patient  LOS - 7   Chief Complaint  Patient presents with  . Altered Mental Status       Brief HPI   Patient is a 39 year old female with right abuse, asthma who was admitted on 4/20 by pulmonary critical care for drug overdose on benzodiazepines and opioids, found to have rhabdomyolysis and respiratory failure. Patient was found by family in the bathroom floor at midnight, unresponsive and agonal breathing. EMS was called and did nasotracheal intubation. Patient was givennarcan with minimal response. She was intubated, with intubation patient became a little more responsive. Patient was found to have rhabdomyolysis with acute renal failure, lactic acidosis, CK 6000 with ATN. Patient had areas of induration on the left lower extremity, left gluteus, left wrist. Patient was admitted by critical care service to ICU. Orthopedics was consulted for possible fasciotomy. Patient was transferred to telemetry floor on 4/24, [facility] service assumed care on 4/25  Significant events 4/20>> Intubated and admitted to MICU 4/21>> Extubated 10/02/14: Patient complains of continued pain on left side specifically in her left foot, knee, hip and wrist. She is unable to move her left toes due to the pain. She admits to productive cough, but denies fever.     Assessment & Plan    Severe sepsis with Escherichia coli UTI, aspiration pneumonia, cellulitis - Currently improving, patient has completed vancomycin, Levaquin, aztreonam, currently on Ceftin    Acute rhabdomyolysis with metabolic acidosis, hypokalemia - CK trending down  Acute respiratory failure with Hypotension - Extubated on  4/21, currently O2 sats 95% on 2 L  Aspiration pneumonia, leukocytosis - Continue Ceftin, mobilization, incentive spirometry  Escherichia coli UTI Urine culture positive for Escherichia coli, continue Ceftin  Polysubstance abuse with drug overdose, likely has narcotic tolerance - Patient counseled on quitting drugs, - change from PCA to IV And PO with hopes of going to CIR on on Friday  Reperfusion injury, radial nerve biopsy, cellulitis, tenosynovitis - PT, OT, gentle hydration, wound care, Silvadene cream, follow hand surgery outpatient  Code Status: Full code   Family Communication: Discussed in detail with the patient, all imaging results, lab results explained to the patient    Disposition Plan: Inpatient rehabilitation Friday?  Time Spent in minutes   25 minutes  Procedures  CT head/ c-spine 4/20>> neg acute  CXR 4/21>> NG tube tip projects over the upper thorax and should be repositioned/advanced. Left greater than right airspace opacities may reflect pneumonia and/or aspiration. L Ankle Xray 4/22>> Normal Left Femur Xray 4/22>> Normal Left Hip Xray 4/22>> No acute or subacute osseous abnormalities involving the pelvis. Symmetric normal-appearing joint spaces in both hips. Calcification adjacent to the superior left acetabular rim. Acetabular rim calcifications can be due to labral calcification/ossification, acetabular rim stress fractures, or os acetabuli.  Os acetabuli and acetabular rim stress fractures put the patient at increased risk for pincer type femoro-acetabular impingement. [place] Left Wrist 4/22>>Subcutaneous edema as well as edema in the soft tissues around the tendons in the distal forearm consistent with cellulitis and tenosynovitis. No defined abscesses.  Left Wrist Xray 4/22>> CPPD involving the triangular fibrocartilage complex.  Consults   Orthopedics Critical care Wound care   DVT Prophylaxis   heparin Medications  Scheduled Meds: . antiseptic oral  rinse  7 mL Mouth Rinse BID  . budesonide (PULMICORT) nebulizer solution  0.25 mg Nebulization BID  . cefUROXime  500 mg Oral BID WC  . docusate sodium  100 mg Oral BID  . DULoxetine  30 mg Oral Daily  . gabapentin  200 mg Oral TID  . heparin  5,000 Units Subcutaneous 3 times per day  . ipratropium-albuterol  3 mL Nebulization BID  . phosphorus  500 mg Oral TID WC & HS  . potassium chloride  40 mEq Oral Daily  . silver sulfADIAZINE   Topical Daily   Continuous Infusions: . sodium chloride 100 mL/hr at 10/07/14 1059   PRN Meds:.albuterol, ALPRAZolam, guaiFENesin-codeine, HYDROmorphone (DILAUDID) injection, ibuprofen, oxyCODONE, polyethylene glycol, sodium chloride   Antibiotics   Anti-infectives    Start     Dose/Rate Route Frequency Ordered Stop   10/04/14 1700  cefUROXime (CEFTIN) tablet 500 mg     500 mg Oral 2 times daily with meals 10/04/14 1244     10/02/14 1030  vancomycin (VANCOCIN) IVPB 750 mg/150 ml premix  Status:  Discontinued     750 mg 150 mL/hr over 60 Minutes Intravenous Every 12 hours 10/02/14 0957 10/03/14 0843   10/01/14 1600  levofloxacin (LEVAQUIN) IVPB 500 mg  Status:  Discontinued     500 mg 100 mL/hr over 60 Minutes Intravenous Every 48 hours 09/30/14 2219 10/01/14 1103   10/01/14 1600  aztreonam (AZACTAM) 1 g in dextrose 5 % 50 mL IVPB  Status:  Discontinued     1 g 100 mL/hr over 30 Minutes Intravenous 3 times per day 10/01/14 1233 10/04/14 1244   10/01/14 1500  vancomycin (VANCOCIN) 500 mg in sodium chloride 0.9 % 100 mL IVPB  Status:  Discontinued     500 mg 100 mL/hr over 60 Minutes Intravenous Every 24 hours 09/30/14 1753 10/02/14 0957   10/01/14 1500  levofloxacin (LEVAQUIN) IVPB 500 mg  Status:  Discontinued     500 mg 100 mL/hr over 60 Minutes Intravenous Every 24 hours 09/30/14 1753 09/30/14 2219   10/01/14 0000  aztreonam (AZACTAM) 1 g in dextrose 5 % 50 mL IVPB  Status:  Discontinued     1 g 100 mL/hr over 30 Minutes Intravenous 3 times per  day 09/30/14 1753 10/01/14 0824   09/30/14 1445  aztreonam (AZACTAM) 2 g in dextrose 5 % 50 mL IVPB     2 g 100 mL/hr over 30 Minutes Intravenous  Once 09/30/14 1409 09/30/14 1610   09/30/14 1415  levofloxacin (LEVAQUIN) IVPB 750 mg     750 mg 100 mL/hr over 90 Minutes Intravenous  Once 09/30/14 1409 09/30/14 1757   09/30/14 1415  vancomycin (VANCOCIN) IVPB 1000 mg/200 mL premix     1,000 mg 200 mL/hr over 60 Minutes Intravenous  Once 09/30/14 1409 09/30/14 1537        Subjective:   Moving all around in bed Pain improved  Objective:   Blood pressure 100/53, pulse 110, temperature 98.7 F (37.1 C), temperature source Oral,  resp. rate 21, height 5\' 6"  (1.676 m), weight 65.8 kg (145 lb 1 oz), SpO2 96 %.  Wt Readings from Last 3 Encounters:  10/04/14 65.8 kg (145 lb 1 oz)  10/07/13 52.164 kg (115 lb)  07/01/13 58.968 kg (130 lb)     Intake/Output Summary (Last 24 hours) at 10/07/14 1350 Last data filed at 10/07/14 1252  Gross per 24 hour  Intake 2977.5 ml  Output   2150 ml  Net  827.5 ml    Exam  General: Alert and oriented x 3, NAD- moving around in bed  CVS: S1-S2 present, tachycardia, RRR  Respiratory: clear  Abdomen: Soft, NT, ND, NBS  Ext:  left lower leg dressing intact, but more swollen with red a[[earing spots    Data Review   Micro Results Recent Results (from the past 240 hour(s))  Blood Culture (routine x 2)     Status: None   Collection Time: 09/30/14  2:15 PM  Result Value Ref Range Status   Specimen Description BLOOD RIGHT FOREARM  Final   Special Requests BOTTLES DRAWN AEROBIC ONLY 3CC  Final   Culture   Final    NO GROWTH 5 DAYS Performed at [facility]    Report Status 10/06/2014 FINAL  Final  Blood Culture (routine x 2)     Status: None   Collection Time: 09/30/14  2:34 PM  Result Value Ref Range Status   Specimen Description BLOOD LEFT HAND  Final   Special Requests BOTTLES DRAWN AEROBIC ONLY 3CC  Final   Culture   Final     NO GROWTH 5 DAYS Performed at [facility]    Report Status 10/06/2014 FINAL  Final  Urine culture     Status: None   Collection Time: 09/30/14  2:54 PM  Result Value Ref Range Status   Specimen Description URINE, CATHETERIZED  Final   Special Requests NONE  Final   Colony Count   Final    >=100,000 COLONIES/ML Performed at [facility]    Culture   Final    ESCHERICHIA COLI Performed at [facility]    Report Status 10/03/2014 FINAL  Final   Organism ID, Bacteria ESCHERICHIA COLI  Final      Susceptibility   Escherichia coli - MIC*    AMPICILLIN >=32 RESISTANT Resistant     CEFAZOLIN <=4 SENSITIVE Sensitive     CEFTRIAXONE <=1 SENSITIVE Sensitive     CIPROFLOXACIN 1 SENSITIVE Sensitive     GENTAMICIN >=16 RESISTANT Resistant     LEVOFLOXACIN 1 SENSITIVE Sensitive     NITROFURANTOIN <=16 SENSITIVE Sensitive     TOBRAMYCIN 8 INTERMEDIATE Intermediate     TRIMETH/SULFA >=320 RESISTANT Resistant     PIP/TAZO <=4 SENSITIVE Sensitive     * ESCHERICHIA COLI  MRSA PCR Screening     Status: None   Collection Time: 09/30/14  5:55 PM  Result Value Ref Range Status   MRSA by PCR NEGATIVE NEGATIVE Final    Comment:        The GeneXpert MRSA Assay (FDA approved for NASAL specimens only), is one component of a comprehensive MRSA colonization surveillance program. It is not intended to diagnose MRSA infection nor to guide or monitor treatment for MRSA infections.   Culture, blood (routine x 2)     Status: None (Preliminary result)   Collection Time: 10/04/14  4:10 PM  Result Value Ref Range Status   Specimen Description BLOOD CENTRAL LINE  Final  Special Requests BOTTLES DRAWN AEROBIC AND ANAEROBIC 10 CC  Final   Culture   Final           BLOOD CULTURE RECEIVED NO GROWTH TO DATE CULTURE WILL BE HELD FOR 5 DAYS BEFORE ISSUING A FINAL NEGATIVE REPORT Performed at [facility]    Report Status PENDING  Incomplete    Radiology Reports Dg  Pelvis 1-2 Views  10/02/2014   CLINICAL DATA:  Acute onset left hip pain radiating into the left leg. No known injury.  EXAM: PELVIS - 1-2 VIEW  COMPARISON:  Left femur x-rays obtained concurrently. Bone window images from CT abdomen and pelvis 10/06/2009.  FINDINGS: No evidence of acute fracture. Hip joints intact with symmetric well preserved joint spaces. Calcification adjacent to the left acetabular rim superiorly. Sacroiliac joints and symphysis pubis intact without evidence of diastasis or significant degenerative change. Visualized lower lumbar spine intact. Well preserved bone mineral density. No intrinsic osseous abnormality.  IMPRESSION: 1. No acute or subacute osseous abnormalities involving the pelvis. 2. Symmetric normal-appearing joint spaces in both hips. 3. Calcification adjacent to the superior left acetabular rim. Acetabular rim calcifications can be due to labral calcification/ossification, acetabular rim stress fractures, or os acetabuli. Os acetabuli and acetabular rim stress fractures put the patient at increased risk for pincer type femoroacetabular impingement.   Electronically Signed   By: Hulan Saas M.D.   On: 10/02/2014 20:46   Dg Wrist Complete Left  10/02/2014   CLINICAL DATA:  Acute onset of left wrist pain and swelling. No known injury.  EXAM: LEFT WRIST - COMPLETE 3+ VIEW  COMPARISON:  None.  FINDINGS: No evidence of acute, subacute or healed fractures. Well preserved joint spaces. Well preserved bone mineral density. Faint calcification involving the triangular fibrocartilage complex.  IMPRESSION: No acute or subacute osseous abnormality. CPPD involving the triangular fibrocartilage complex.   Electronically Signed   By: Hulan Saas M.D.   On: 10/02/2014 20:44   Dg Ankle Complete Left  10/02/2014   CLINICAL DATA:  Acute onset left ankle pain and swelling. No known injury.  EXAM: LEFT ANKLE COMPLETE - 3+ VIEW  COMPARISON:  None.  FINDINGS: No evidence of acute  fracture. Ankle mortise intact with well-preserved joint space. Well-preserved bone mineral density. No intrinsic osseous abnormalities. No visible joint effusion.  IMPRESSION: Normal examination.   Electronically Signed   By: Hulan Saas M.D.   On: 10/02/2014 20:46   Ct Head Wo Contrast  09/30/2014   CLINICAL DATA:  39 year old female with altered mental status with possible drug overdose.  EXAM: CT HEAD WITHOUT CONTRAST  CT CERVICAL SPINE WITHOUT CONTRAST  TECHNIQUE: Multidetector CT imaging of the head and cervical spine was performed following the standard protocol without intravenous contrast. Multiplanar CT image reconstructions of the cervical spine were also generated.  COMPARISON:  Prior CT scan of the head and cervical spine 06/20/2011  FINDINGS: CT HEAD FINDINGS  Insert tonight via perhaps mild soft tissue contusion left temporal scalp just above the ear. No underlying calvarial injury. Mastoid air cells and paranasal sinuses are well aerated. Orbits and globes are intact and symmetric bilaterally.  CT CERVICAL SPINE FINDINGS  No acute fracture, malalignment or prevertebral soft tissue swelling. Unremarkable CT appearance of the thyroid gland. No acute soft tissue abnormality. Biapical pleural parenchymal scarring. The patient is intubated and a nasogastric tube is present. Both tubes are incompletely imaged.  IMPRESSION: CT HEAD  1. Negative CT CSPINE  1. Negative   Electronically  Signed   By: Malachy MoanHeath  McCullough M.D.   On: 09/30/2014 15:33   Ct Cervical Spine Wo Contrast  09/30/2014   CLINICAL DATA:  39 year old female with altered mental status with possible drug overdose.  EXAM: CT HEAD WITHOUT CONTRAST  CT CERVICAL SPINE WITHOUT CONTRAST  TECHNIQUE: Multidetector CT imaging of the head and cervical spine was performed following the standard protocol without intravenous contrast. Multiplanar CT image reconstructions of the cervical spine were also generated.  COMPARISON:  Prior CT scan of the  head and cervical spine 06/20/2011  FINDINGS: CT HEAD FINDINGS  Insert tonight via perhaps mild soft tissue contusion left temporal scalp just above the ear. No underlying calvarial injury. Mastoid air cells and paranasal sinuses are well aerated. Orbits and globes are intact and symmetric bilaterally.  CT CERVICAL SPINE FINDINGS  No acute fracture, malalignment or prevertebral soft tissue swelling. Unremarkable CT appearance of the thyroid gland. No acute soft tissue abnormality. Biapical pleural parenchymal scarring. The patient is intubated and a nasogastric tube is present. Both tubes are incompletely imaged.  IMPRESSION: CT HEAD  1. Negative CT CSPINE  1. Negative   Electronically Signed   By: Malachy MoanHeath  McCullough M.D.   On: 09/30/2014 15:33   [place] Extrem Up Left Ltd  10/02/2014   CLINICAL DATA:  Edema in the left hand and wrist.  EXAM: ULTRASOUND OF THE LEFT HAND AND WRIST  LIMITED  TECHNIQUE: Ultrasound examination of the upper extremity soft tissues was performed in the area of clinical concern.  COMPARISON:  Right hand and wrist was used for comparison.  FINDINGS: There is subcutaneous edema without a definable abscess. There is also edema around the tendons in the distal forearm. No definable abscess.  IMPRESSION: Subcutaneous edema as well as edema in the soft tissues around the tendons in the distal forearm consistent with cellulitis and tenosynovitis. No defined abscesses.   Electronically Signed   By: Francene BoyersJames  Maxwell M.D.   On: 10/02/2014 18:54   Dg Chest Port 1 View  10/01/2014   CLINICAL DATA:  Dyspnea and worsening shortness of breath.  EXAM: PORTABLE CHEST - 1 VIEW  COMPARISON:  10/01/2014  FINDINGS: Multifocal airspace opacities, increased and most pronounced left perihilar and retrocardiac, right upper lobe/ apex. No overt effusion. No pneumothorax. Cardiomediastinal contours within normal range. Interval extubation. No acute osseous finding. Surgical clips right upper quadrant.  IMPRESSION:  Increased bilateral airspace opacities, may reflect multifocal pneumonia and/or edema.  Recommend short-term radiograph follow-up to document resolution.   Electronically Signed   By: Jearld LeschAndrew  DelGaizo M.D.   On: 10/01/2014 21:29   Dg Chest Port 1 View  10/01/2014   CLINICAL DATA:  Aspiration pneumonia, shortness of breath, intubated.  EXAM: PORTABLE CHEST - 1 VIEW  COMPARISON:  09/30/2014  FINDINGS: Endotracheal tube tip projects 3.3 cm proximal to the carina. NG tube tip projects over the upper thorax and may be within the proximal/mid esophagus. Multifocal airspace opacities, most pronounced within the left lower lung. No pleural effusion or pneumothorax. No acute osseous finding.  IMPRESSION: NG tube tip projects over the upper thorax and should be repositioned/advanced. Discussed via telephone with the patient's nurse, Roe Coombson, at 6:08 a.m. on 10/01/2014.  Left greater than right airspace opacities may reflect pneumonia and/or aspiration.   Electronically Signed   By: Jearld LeschAndrew  DelGaizo M.D.   On: 10/01/2014 06:11   Dg Chest Port 1 View  09/30/2014   CLINICAL DATA:  Or dental status.  EXAM: PORTABLE CHEST - 1 VIEW  COMPARISON:  09/27/2013 head CT scan dated 10/07/2013  FINDINGS: Endotracheal tube and OG tube within inserted in appear in good position. Is a left perihilar infiltrate extending into the upper lobe and a faint patchy infiltrate at the left lung base. There is also suggestion of infiltrate/ atelectasis in the right upper lobe medially. Right mid and lower lung zones are clear. Heart size and vascularity are normal. No osseous abnormality.  IMPRESSION: Bilateral infiltrates,/ atelectasis, mainly on the left. The possibility of aspiration pneumonitis should be considered .   Electronically Signed   By: Francene Boyers M.D.   On: 09/30/2014 14:50   Dg Femur Min 2 Views Left  10/02/2014   CLINICAL DATA:  Acute onset left hip pain radiating into the left leg. No known injuries.  EXAM: LEFT FEMUR 2 VIEWS   COMPARISON:  None.  FINDINGS: No evidence of acute, subacute or healed fractures. Well preserved bone mineral density. No intrinsic osseous abnormality involving the femur. Visualized hip joint intact though there is a small calcification adjacent to the acetabular rim superiorly. Visualized knee joint intact.  IMPRESSION: 1. Normal-appearing left femur. 2. Small calcification adjacent to the superior acetabular rim. Acetabular rim calcifications can be due to labral calcification/ossification, acetabular rim stress fractures, or os acetabuli. Os acetabuli and acetabular rim stress fractures put the patient at increased risk for pincer type femoroacetabular impingement.   Electronically Signed   By: Hulan Saas M.D.   On: 10/02/2014 20:42    CBC  Recent Labs Lab 09/30/14 1906  10/02/14 0533 10/04/14 0230 10/05/14 0910 10/06/14 0611 10/07/14 0610  WBC 12.6*  < > 10.4 9.1 10.8* 8.8 9.4  HGB 13.7  < > 12.4 10.1* 10.7* 10.5* 9.5*  HCT 41.1  < > 36.7 29.8* 31.7* 30.8* 28.4*  PLT 244  < > 185 158 168 189 218  MCV 96.0  < > 93.6 92.0 91.9 91.7 91.6  MCH 32.0  < > 31.6 31.8 31.0 31.3 30.6  MCHC 33.3  < > 33.8 34.6 33.8 34.1 33.5  RDW 14.4  < > 14.6 14.4 14.4 14.6 14.5  LYMPHSABS 0.9  --   --  1.1 1.0 1.8 1.4  MONOABS 0.8  --   --  0.7 0.9 1.1* 1.0  EOSABS 0.0  --   --  0.1 0.3 0.4 0.3  BASOSABS 0.0  --   --  0.0 0.0 0.0 0.0  < > = values in this interval not displayed.  Chemistries   Recent Labs Lab 09/30/14 1415  09/30/14 1906  10/03/14 0241 10/04/14 0630 10/05/14 0910 10/06/14 0611 10/07/14 0610  NA 138  < > 139  < > 139 137 137 140 136  K 6.5*  < > 5.3*  < > 2.7* 2.8* 3.3* 3.8 3.7  CL 101  < > 113*  < > 102 104 102 102 99  CO2 18*  < > 16*  < > GLUCOSE 127*  < > 165*  < > 148* 148* 128* 94 110*  BUN 34*  < > 29*  < > <5* <5* <5* 6 <5*  CREATININE 2.93*  < > 1.78*  < > 0.79 0.72 0.58 0.65 0.63  CALCIUM 8.2*  < > 6.2*  < > 7.5* 8.0* 8.1* 8.6 8.2*  MG  --    --   --   --   --  1.5 1.8 1.9 1.8  AST 174*  --  307*  --  571* 362*  --   --   --  ALT 85*  --  109*  --  227* 214*  --   --   --   ALKPHOS 111  --  86  --  81 89  --   --   --   BILITOT 0.8  --  0.8  --  0.7 0.6  --   --   --   < > = values in this interval not displayed. ------------------------------------------------------------------------------------------------------------------ estimated creatinine clearance is 91 mL/min (by C-G formula based on Cr of 0.63). ------------------------------------------------------------------------------------------------------------------ No results for input(s): HGBA1C in the last 72 hours. ------------------------------------------------------------------------------------------------------------------ No results for input(s): CHOL, HDL, LDLCALC, TRIG, CHOLHDL, LDLDIRECT in the last 72 hours. ------------------------------------------------------------------------------------------------------------------ No results for input(s): TSH, T4TOTAL, T3FREE, THYROIDAB in the last 72 hours.  Invalid input(s): FREET3 ------------------------------------------------------------------------------------------------------------------ No results for input(s): VITAMINB12, FOLATE, FERRITIN, TIBC, IRON, RETICCTPCT in the last 72 hours.  Coagulation profile  Recent Labs Lab 10/03/14 0844  INR 1.38    No results for input(s): DDIMER in the last 72 hours.  Cardiac Enzymes  Recent Labs Lab 10/01/14 0421 10/03/14 0844 10/04/14 0630  CKMB >300.0* 17.0* 8.8*   ------------------------------------------------------------------------------------------------------------------ Invalid input(s): POCBNP  No results for input(s): GLUCAP in the last 72 hours.   VANN, JESSICA  DO. [facility] 10/07/2014, 1:50 PM  Pager: [phone]     After 7pm go to [email or web address] - password [email or web address]  Call night coverage person covering after 7pm

## 2014-10-07 NOTE — Progress Notes (Signed)
Utilization review complete 

## 2014-10-07 NOTE — Progress Notes (Signed)
Pt's PCA has been discontinued. Pt last received 0.3mg  of Dilaudid. Wasted the rest (22ml of Dilaudid) with Kendall FlackKaty Le RN.

## 2014-10-07 NOTE — Progress Notes (Signed)
Wasted 2ml of Dilaudid from PCA with Kendall FlackKaty Le RN. New syringe in place.

## 2014-10-08 ENCOUNTER — Encounter (HOSPITAL_COMMUNITY): Payer: Self-pay | Admitting: *Deleted

## 2014-10-08 ENCOUNTER — Inpatient Hospital Stay (HOSPITAL_COMMUNITY): Payer: Medicaid Other

## 2014-10-08 ENCOUNTER — Inpatient Hospital Stay (HOSPITAL_COMMUNITY)
Admission: AD | Admit: 2014-10-08 | Discharge: 2014-10-15 | DRG: 947 | Disposition: A | Discharge status: Home / Self Care | Payer: Medicaid Other | Source: Intra-hospital | Attending: Physical Medicine & Rehabilitation | Admitting: Physical Medicine & Rehabilitation

## 2014-10-08 DIAGNOSIS — N390 Urinary tract infection, site not specified: Secondary | ICD-10-CM | POA: Diagnosis not present

## 2014-10-08 DIAGNOSIS — M21332 Wrist drop, left wrist: Secondary | ICD-10-CM

## 2014-10-08 DIAGNOSIS — A419 Sepsis, unspecified organism: Secondary | ICD-10-CM | POA: Diagnosis not present

## 2014-10-08 DIAGNOSIS — M21372 Foot drop, left foot: Secondary | ICD-10-CM

## 2014-10-08 DIAGNOSIS — T79A29A Traumatic compartment syndrome of unspecified lower extremity, initial encounter: Secondary | ICD-10-CM | POA: Diagnosis present

## 2014-10-08 DIAGNOSIS — G5772 Causalgia of left lower limb: Secondary | ICD-10-CM

## 2014-10-08 DIAGNOSIS — T1491 Suicide attempt: Secondary | ICD-10-CM | POA: Diagnosis not present

## 2014-10-08 DIAGNOSIS — M6282 Rhabdomyolysis: Secondary | ICD-10-CM

## 2014-10-08 DIAGNOSIS — T79A29S Traumatic compartment syndrome of unspecified lower extremity, sequela: Secondary | ICD-10-CM

## 2014-10-08 DIAGNOSIS — F1310 Sedative, hypnotic or anxiolytic abuse, uncomplicated: Secondary | ICD-10-CM | POA: Diagnosis present

## 2014-10-08 DIAGNOSIS — F1110 Opioid abuse, uncomplicated: Secondary | ICD-10-CM | POA: Diagnosis not present

## 2014-10-08 DIAGNOSIS — T391X2A Poisoning by 4-Aminophenol derivatives, intentional self-harm, initial encounter: Secondary | ICD-10-CM | POA: Diagnosis not present

## 2014-10-08 DIAGNOSIS — R5381 Other malaise: Principal | ICD-10-CM

## 2014-10-08 DIAGNOSIS — Z09 Encounter for follow-up examination after completed treatment for conditions other than malignant neoplasm: Secondary | ICD-10-CM

## 2014-10-08 DIAGNOSIS — J690 Pneumonitis due to inhalation of food and vomit: Secondary | ICD-10-CM

## 2014-10-08 DIAGNOSIS — F329 Major depressive disorder, single episode, unspecified: Secondary | ICD-10-CM | POA: Diagnosis not present

## 2014-10-08 DIAGNOSIS — G5692 Unspecified mononeuropathy of left upper limb: Secondary | ICD-10-CM | POA: Diagnosis not present

## 2014-10-08 DIAGNOSIS — F419 Anxiety disorder, unspecified: Secondary | ICD-10-CM | POA: Diagnosis not present

## 2014-10-08 DIAGNOSIS — R05 Cough: Secondary | ICD-10-CM

## 2014-10-08 DIAGNOSIS — F1994 Other psychoactive substance use, unspecified with psychoactive substance-induced mood disorder: Secondary | ICD-10-CM | POA: Clinically undetermined

## 2014-10-08 DIAGNOSIS — M792 Neuralgia and neuritis, unspecified: Secondary | ICD-10-CM

## 2014-10-08 DIAGNOSIS — T424X2A Poisoning by benzodiazepines, intentional self-harm, initial encounter: Secondary | ICD-10-CM | POA: Diagnosis not present

## 2014-10-08 DIAGNOSIS — R059 Cough, unspecified: Secondary | ICD-10-CM

## 2014-10-08 DIAGNOSIS — D62 Acute posthemorrhagic anemia: Secondary | ICD-10-CM

## 2014-10-08 DIAGNOSIS — F17210 Nicotine dependence, cigarettes, uncomplicated: Secondary | ICD-10-CM | POA: Diagnosis not present

## 2014-10-08 DIAGNOSIS — T79A22S Traumatic compartment syndrome of left lower extremity, sequela: Secondary | ICD-10-CM

## 2014-10-08 DIAGNOSIS — R062 Wheezing: Secondary | ICD-10-CM

## 2014-10-08 DIAGNOSIS — T796XXA Traumatic ischemia of muscle, initial encounter: Secondary | ICD-10-CM | POA: Diagnosis not present

## 2014-10-08 LAB — URINALYSIS, ROUTINE W REFLEX MICROSCOPIC
Bilirubin Urine: NEGATIVE
Glucose, UA: NEGATIVE mg/dL
HGB URINE DIPSTICK: NEGATIVE
KETONES UR: NEGATIVE mg/dL
Leukocytes, UA: NEGATIVE
NITRITE: NEGATIVE
PROTEIN: NEGATIVE mg/dL
Specific Gravity, Urine: 1.012 (ref 1.005–1.030)
UROBILINOGEN UA: 1 mg/dL (ref 0.0–1.0)
pH: 7 (ref 5.0–8.0)

## 2014-10-08 LAB — HCV RNA QUANT
HCV Quantitative Log: 2.89 {Log} — ABNORMAL HIGH (ref <1.18)
HCV Quantitative: 771 IU/mL — ABNORMAL HIGH (ref <15)

## 2014-10-08 MED ORDER — PROCHLORPERAZINE EDISYLATE 5 MG/ML IJ SOLN
5.0000 mg | Freq: Four times a day (QID) | INTRAMUSCULAR | Status: DC | PRN
  Filled 2014-10-08: qty 2

## 2014-10-08 MED ORDER — PROCHLORPERAZINE 25 MG RE SUPP
12.5000 mg | Freq: Four times a day (QID) | RECTAL | Status: DC | PRN
  Filled 2014-10-08: qty 1

## 2014-10-08 MED ORDER — SILVER SULFADIAZINE 1 % EX CREA
TOPICAL_CREAM | Freq: Every day | CUTANEOUS | Status: DC
  Administered 2014-10-09: 08:00:00 via TOPICAL
  Administered 2014-10-10: 1 via TOPICAL
  Administered 2014-10-11 – 2014-10-14 (×4): via TOPICAL
  Filled 2014-10-08: qty 85

## 2014-10-08 MED ORDER — BISACODYL 10 MG RE SUPP: 10.0000 mg | Freq: Every day | RECTAL | Status: DC | PRN

## 2014-10-08 MED ORDER — K PHOS MONO-SOD PHOS DI & MONO 155-852-130 MG PO TABS
500.0000 mg | ORAL_TABLET | Freq: Three times a day (TID) | ORAL | Status: DC
  Administered 2014-10-08 – 2014-10-15 (×26): 500 mg via ORAL
  Filled 2014-10-08 (×30): qty 2

## 2014-10-08 MED ORDER — LEVOFLOXACIN 750 MG PO TABS
750.0000 mg | ORAL_TABLET | Freq: Every day | ORAL | Status: DC
  Administered 2014-10-08: 750 mg via ORAL
  Filled 2014-10-08: qty 1

## 2014-10-08 MED ORDER — IPRATROPIUM-ALBUTEROL 0.5-2.5 (3) MG/3ML IN SOLN
3.0000 mL | Freq: Four times a day (QID) | RESPIRATORY_TRACT | Status: DC
  Administered 2014-10-08: 3 mL via RESPIRATORY_TRACT
  Filled 2014-10-08: qty 3

## 2014-10-08 MED ORDER — DULOXETINE HCL 30 MG PO CPEP
30.0000 mg | ORAL_CAPSULE | Freq: Every day | ORAL | Status: DC
  Administered 2014-10-09 – 2014-10-15 (×7): 30 mg via ORAL
  Filled 2014-10-08 (×9): qty 1

## 2014-10-08 MED ORDER — ENOXAPARIN SODIUM 40 MG/0.4ML ~~LOC~~ SOLN
40.0000 mg | SUBCUTANEOUS | Status: DC
  Administered 2014-10-09 – 2014-10-14 (×6): 40 mg via SUBCUTANEOUS
  Filled 2014-10-08 (×8): qty 0.4

## 2014-10-08 MED ORDER — GUAIFENESIN-CODEINE 100-10 MG/5ML PO SOLN: 5.0000 mL | Freq: Four times a day (QID) | ORAL | Status: DC | PRN

## 2014-10-08 MED ORDER — GABAPENTIN 100 MG PO CAPS
200.0000 mg | ORAL_CAPSULE | Freq: Three times a day (TID) | ORAL | Status: DC
  Administered 2014-10-08 – 2014-10-12 (×13): 200 mg via ORAL
  Filled 2014-10-08 (×17): qty 2

## 2014-10-08 MED ORDER — OXYCODONE HCL 5 MG PO TABS
5.0000 mg | ORAL_TABLET | ORAL | Status: DC | PRN
  Administered 2014-10-08: 10 mg via ORAL
  Filled 2014-10-08: qty 2

## 2014-10-08 MED ORDER — IPRATROPIUM-ALBUTEROL 0.5-2.5 (3) MG/3ML IN SOLN
3.0000 mL | Freq: Four times a day (QID) | RESPIRATORY_TRACT | Status: DC
  Administered 2014-10-08 – 2014-10-09 (×4): 3 mL via RESPIRATORY_TRACT
  Filled 2014-10-08 (×4): qty 3

## 2014-10-08 MED ORDER — GUAIFENESIN-DM 100-10 MG/5ML PO SYRP: 5.0000 mL | ORAL_SOLUTION | Freq: Four times a day (QID) | ORAL | Status: DC | PRN

## 2014-10-08 MED ORDER — FLEET ENEMA 7-19 GM/118ML RE ENEM: 1.0000 | ENEMA | Freq: Once | RECTAL | Status: AC | PRN

## 2014-10-08 MED ORDER — ALPRAZOLAM 0.25 MG PO TABS: 0.2500 mg | ORAL_TABLET | Freq: Two times a day (BID) | ORAL | Status: DC | PRN

## 2014-10-08 MED ORDER — DOCUSATE SODIUM 100 MG PO CAPS
100.0000 mg | ORAL_CAPSULE | Freq: Two times a day (BID) | ORAL | Status: DC
Start: 2014-10-08 — End: 2014-10-15
  Administered 2014-10-08 – 2014-10-15 (×9): 100 mg via ORAL
  Filled 2014-10-08 (×17): qty 1

## 2014-10-08 MED ORDER — TRAZODONE HCL 50 MG PO TABS
50.0000 mg | ORAL_TABLET | Freq: Every evening | ORAL | Status: DC | PRN
  Administered 2014-10-08 – 2014-10-12 (×5): 50 mg via ORAL
  Administered 2014-10-14: 100 mg via ORAL
  Filled 2014-10-08 (×3): qty 1
  Filled 2014-10-08 (×3): qty 2
  Filled 2014-10-08: qty 1

## 2014-10-08 MED ORDER — OXYCODONE HCL 5 MG PO TABS
5.0000 mg | ORAL_TABLET | ORAL | Status: DC | PRN
  Administered 2014-10-08 (×2): 10 mg via ORAL
  Filled 2014-10-08 (×2): qty 2

## 2014-10-08 MED ORDER — PROCHLORPERAZINE MALEATE 5 MG PO TABS
5.0000 mg | ORAL_TABLET | Freq: Four times a day (QID) | ORAL | Status: DC | PRN
  Filled 2014-10-08: qty 2

## 2014-10-08 MED ORDER — ACETAMINOPHEN 325 MG PO TABS
325.0000 mg | ORAL_TABLET | ORAL | Status: DC | PRN
  Administered 2014-10-09 – 2014-10-13 (×3): 650 mg via ORAL
  Administered 2014-10-14: 325 mg via ORAL
  Administered 2014-10-15: 650 mg via ORAL
  Filled 2014-10-08 (×5): qty 2

## 2014-10-08 MED ORDER — POLYETHYLENE GLYCOL 3350 17 G PO PACK
17.0000 g | PACK | Freq: Every day | ORAL | Status: DC | PRN
  Administered 2014-10-14: 17 g via ORAL
  Filled 2014-10-08 (×3): qty 1

## 2014-10-08 MED ORDER — BUDESONIDE 0.25 MG/2ML IN SUSP
0.2500 mg | Freq: Two times a day (BID) | RESPIRATORY_TRACT | Status: DC
  Administered 2014-10-08 – 2014-10-09 (×2): 0.25 mg via RESPIRATORY_TRACT
  Filled 2014-10-08 (×6): qty 2

## 2014-10-08 MED ORDER — ALUM & MAG HYDROXIDE-SIMETH 200-200-20 MG/5ML PO SUSP: 30.0000 mL | ORAL | Status: DC | PRN

## 2014-10-08 MED ORDER — ALBUTEROL SULFATE (2.5 MG/3ML) 0.083% IN NEBU
2.5000 mg | INHALATION_SOLUTION | RESPIRATORY_TRACT | Status: DC | PRN
  Administered 2014-10-10 – 2014-10-11 (×2): 2.5 mg via RESPIRATORY_TRACT
  Filled 2014-10-08 (×2): qty 3

## 2014-10-08 MED ORDER — LEVOFLOXACIN 750 MG PO TABS
750.0000 mg | ORAL_TABLET | Freq: Every day | ORAL | Status: DC
  Administered 2014-10-09 – 2014-10-15 (×7): 750 mg via ORAL
  Filled 2014-10-08 (×9): qty 1

## 2014-10-08 NOTE — Discharge Summary (Addendum)
Physician Discharge Summary  Charlotte Strong WGN:[id number] DOB: 1975/07/30 DOA: 09/30/2014  PCP: No PCP Per Patient  Admit date: 09/30/2014 Discharge date: 10/08/2014  Time spent: 35 minutes  Recommendations for Outpatient Follow-up:  To CIR oxycodone q3h to help with successful transition of parenteral opioids. Gabapentin can be increased in few days if needed and cymbalta could be increased to  next week if neuropathic symptoms persist.  -repeat x ray in 6 weeks to ensure resolution of PNA  -u/s left breast if still tender  Discharge Diagnoses:  Active Problems:   Altered mental status   Acute renal failure   Rhabdomyolysis   Acute respiratory failure with hypoxia   Lactic acidosis   Encephalopathy acute   Aspiration pneumonia   Pain   Neuropathic pain   Depression with anxiety   Palliative care encounter   Wheezing   Discharge Condition: improved  Diet recommendation: regular  Filed Weights   10/02/14 0354 10/03/14 0453 10/04/14 0400  Weight: 66 kg (145 lb 8.1 oz) 64.7 kg (142 lb 10.2 oz) 65.8 kg (145 lb 1 oz)    History of present illness:  39 yo female with hx drug abuse, asthma presented 4/20 after being found unresponsive at home with presumed OD. She was found on the bathroom floor the night prior to admission by her mothers boyfriend who assumed she was "just sleeping" from drug use and he left her there. She was still in the same position on her L side when he found her around noon on 4/20 and called 911. In ER she is mildly hypotensive, remains unresponsive with cool, mottled LLE.   Hospital Course:  Severe sepsis with Escherichia coli UTI, aspiration pneumonia, cellulitis - will need follow up x ray for resolution  Acute rhabdomyolysis with metabolic acidosis, hypokalemia - CK trending down  Acute respiratory failure with Hypotension - Extubated on 4/21, currently O2 sats 95% on 2 L- wean as tolerated  Aspiration pneumonia, leukocytosis - change  to levaquin, mobilization, incentive spirometry  Polysubstance abuse with drug overdose, likely has narcotic tolerance - Patient counseled on quitting drugs, - changed to oral meds: oxycodone q3h to help with successful transition of parenteral opioids. Gabapentin can be increased in few days if needed and cymbalta could be increased to  next week if neuropathic symptoms persist.  Reperfusion injury, radial nerve biopsy, cellulitis, tenosynovitis wound care, Silvadene cream, follow hand surgery outpatient  Procedures:    Consultations: PCCM Vascular Palliative care Ortho WOC   Discharge Exam: Filed Vitals:   10/08/14 0649  BP: 147/76  Pulse: 93  Temp: 98.5 F (36.9 C)  Resp: 20    General: A+Ox3, NAD Cardiovascular: rrr Respiratory: wheezing, expiratory  Discharge Instructions    Current Discharge Medication List    CONTINUE these medications which have NOT CHANGED   Details  acetaminophen (TYLENOL) 500 MG tablet Take 1,000 mg by mouth every 6 (six) hours as needed for mild pain.    ibuprofen (ADVIL,MOTRIN) 200 MG tablet Take 200 mg by mouth every 6 (six) hours as needed for moderate pain.    acyclovir (ZOVIRAX) 400 MG tablet Take 1 tablet (400 mg total) by mouth 3 (three) times daily. Qty: 30 tablet, Refills: 0       Allergies  Allergen Reactions  . Benadryl [Diphenhydramine Hcl (Sleep)] Nausea And Vomiting  . Penicillins Hives and Nausea And Vomiting   Follow-up Information    Follow up with [facility].   Why:  Monday May 2nd  11:00   Contact information:   [address] [place] [address] [phone]       The results of significant diagnostics from this hospitalization (including imaging, microbiology, ancillary and laboratory) are listed below for reference.    Significant Diagnostic Studies: Dg Pelvis 1-2 Views  10/02/2014   CLINICAL DATA:  Acute onset left hip pain radiating into  the left leg. No known injury.  EXAM: PELVIS - 1-2 VIEW  COMPARISON:  Left femur x-rays obtained concurrently. Bone window images from CT abdomen and pelvis 10/06/2009.  FINDINGS: No evidence of acute fracture. Hip joints intact with symmetric well preserved joint spaces. Calcification adjacent to the left acetabular rim superiorly. Sacroiliac joints and symphysis pubis intact without evidence of diastasis or significant degenerative change. Visualized lower lumbar spine intact. Well preserved bone mineral density. No intrinsic osseous abnormality.  IMPRESSION: 1. No acute or subacute osseous abnormalities involving the pelvis. 2. Symmetric normal-appearing joint spaces in both hips. 3. Calcification adjacent to the superior left acetabular rim. Acetabular rim calcifications can be due to labral calcification/ossification, acetabular rim stress fractures, or os acetabuli. Os acetabuli and acetabular rim stress fractures put the patient at increased risk for pincer type femoroacetabular impingement.   Electronically Signed   By: Hulan Saas M.D.   On: 10/02/2014 20:46   Dg Wrist Complete Left  10/02/2014   CLINICAL DATA:  Acute onset of left wrist pain and swelling. No known injury.  EXAM: LEFT WRIST - COMPLETE 3+ VIEW  COMPARISON:  None.  FINDINGS: No evidence of acute, subacute or healed fractures. Well preserved joint spaces. Well preserved bone mineral density. Faint calcification involving the triangular fibrocartilage complex.  IMPRESSION: No acute or subacute osseous abnormality. CPPD involving the triangular fibrocartilage complex.   Electronically Signed   By: Hulan Saas M.D.   On: 10/02/2014 20:44   Dg Ankle Complete Left  10/02/2014   CLINICAL DATA:  Acute onset left ankle pain and swelling. No known injury.  EXAM: LEFT ANKLE COMPLETE - 3+ VIEW  COMPARISON:  None.  FINDINGS: No evidence of acute fracture. Ankle mortise intact with well-preserved joint space. Well-preserved bone mineral  density. No intrinsic osseous abnormalities. No visible joint effusion.  IMPRESSION: Normal examination.   Electronically Signed   By: Hulan Saas M.D.   On: 10/02/2014 20:46   Ct Head Wo Contrast  09/30/2014   CLINICAL DATA:  39 year old female with altered mental status with possible drug overdose.  EXAM: CT HEAD WITHOUT CONTRAST  CT CERVICAL SPINE WITHOUT CONTRAST  TECHNIQUE: Multidetector CT imaging of the head and cervical spine was performed following the standard protocol without intravenous contrast. Multiplanar CT image reconstructions of the cervical spine were also generated.  COMPARISON:  Prior CT scan of the head and cervical spine 06/20/2011  FINDINGS: CT HEAD FINDINGS  Insert tonight via perhaps mild soft tissue contusion left temporal scalp just above the ear. No underlying calvarial injury. Mastoid air cells and paranasal sinuses are well aerated. Orbits and globes are intact and symmetric bilaterally.  CT CERVICAL SPINE FINDINGS  No acute fracture, malalignment or prevertebral soft tissue swelling. Unremarkable CT appearance of the thyroid gland. No acute soft tissue abnormality. Biapical pleural parenchymal scarring. The patient is intubated and a nasogastric tube is present. Both tubes are incompletely imaged.  IMPRESSION: CT HEAD  1. Negative CT CSPINE  1. Negative   Electronically Signed   By: Malachy Moan M.D.   On: 09/30/2014 15:33   Ct Cervical Spine  Wo Contrast  09/30/2014   CLINICAL DATA:  39 year old female with altered mental status with possible drug overdose.  EXAM: CT HEAD WITHOUT CONTRAST  CT CERVICAL SPINE WITHOUT CONTRAST  TECHNIQUE: Multidetector CT imaging of the head and cervical spine was performed following the standard protocol without intravenous contrast. Multiplanar CT image reconstructions of the cervical spine were also generated.  COMPARISON:  Prior CT scan of the head and cervical spine 06/20/2011  FINDINGS: CT HEAD FINDINGS  Insert tonight via perhaps  mild soft tissue contusion left temporal scalp just above the ear. No underlying calvarial injury. Mastoid air cells and paranasal sinuses are well aerated. Orbits and globes are intact and symmetric bilaterally.  CT CERVICAL SPINE FINDINGS  No acute fracture, malalignment or prevertebral soft tissue swelling. Unremarkable CT appearance of the thyroid gland. No acute soft tissue abnormality. Biapical pleural parenchymal scarring. The patient is intubated and a nasogastric tube is present. Both tubes are incompletely imaged.  IMPRESSION: CT HEAD  1. Negative CT CSPINE  1. Negative   Electronically Signed   By: Malachy Moan M.D.   On: 09/30/2014 15:33   [place] Extrem Up Left Ltd  10/02/2014   CLINICAL DATA:  Edema in the left hand and wrist.  EXAM: ULTRASOUND OF THE LEFT HAND AND WRIST  LIMITED  TECHNIQUE: Ultrasound examination of the upper extremity soft tissues was performed in the area of clinical concern.  COMPARISON:  Right hand and wrist was used for comparison.  FINDINGS: There is subcutaneous edema without a definable abscess. There is also edema around the tendons in the distal forearm. No definable abscess.  IMPRESSION: Subcutaneous edema as well as edema in the soft tissues around the tendons in the distal forearm consistent with cellulitis and tenosynovitis. No defined abscesses.   Electronically Signed   By: Francene Boyers M.D.   On: 10/02/2014 18:54   Dg Chest Port 1 View  10/08/2014   CLINICAL DATA:  R06.2 (ICD-10-CM) - Wheezing Smokes 1 1/2 packes per day Cough Shortness of breath Symptoms 1 day  EXAM: PORTABLE CHEST - 1 VIEW  COMPARISON:  10/01/2014  FINDINGS: Right arm PICC is been placed to the distal SVC. Patchy bilateral airspace disease, improved in the left mid and upper lung, but worsened in the right mid and lower lung since previous exam with progressive consolidation. Heart size remains normal. No effusion. Visualized skeletal structures are unremarkable.  IMPRESSION: 1. Asymmetric  airspace disease, worse on the right and improved on the left since previous exam.   Electronically Signed   By: Corlis Leak M.D.   On: 10/08/2014 09:57   Dg Chest Port 1 View  10/01/2014   CLINICAL DATA:  Dyspnea and worsening shortness of breath.  EXAM: PORTABLE CHEST - 1 VIEW  COMPARISON:  10/01/2014  FINDINGS: Multifocal airspace opacities, increased and most pronounced left perihilar and retrocardiac, right upper lobe/ apex. No overt effusion. No pneumothorax. Cardiomediastinal contours within normal range. Interval extubation. No acute osseous finding. Surgical clips right upper quadrant.  IMPRESSION: Increased bilateral airspace opacities, may reflect multifocal pneumonia and/or edema.  Recommend short-term radiograph follow-up to document resolution.   Electronically Signed   By: Jearld Lesch M.D.   On: 10/01/2014 21:29   Dg Chest Port 1 View  10/01/2014   CLINICAL DATA:  Aspiration pneumonia, shortness of breath, intubated.  EXAM: PORTABLE CHEST - 1 VIEW  COMPARISON:  09/30/2014  FINDINGS: Endotracheal tube tip projects 3.3 cm proximal to the carina. NG tube tip projects over  the upper thorax and may be within the proximal/mid esophagus. Multifocal airspace opacities, most pronounced within the left lower lung. No pleural effusion or pneumothorax. No acute osseous finding.  IMPRESSION: NG tube tip projects over the upper thorax and should be repositioned/advanced. Discussed via telephone with the patient's nurse, Roe Coombson, at 6:08 a.m. on 10/01/2014.  Left greater than right airspace opacities may reflect pneumonia and/or aspiration.   Electronically Signed   By: Jearld LeschAndrew  DelGaizo M.D.   On: 10/01/2014 06:11   Dg Chest Port 1 View  09/30/2014   CLINICAL DATA:  Or dental status.  EXAM: PORTABLE CHEST - 1 VIEW  COMPARISON:  09/27/2013 head CT scan dated 10/07/2013  FINDINGS: Endotracheal tube and OG tube within inserted in appear in good position. Is a left perihilar infiltrate extending into the upper  lobe and a faint patchy infiltrate at the left lung base. There is also suggestion of infiltrate/ atelectasis in the right upper lobe medially. Right mid and lower lung zones are clear. Heart size and vascularity are normal. No osseous abnormality.  IMPRESSION: Bilateral infiltrates,/ atelectasis, mainly on the left. The possibility of aspiration pneumonitis should be considered .   Electronically Signed   By: Francene BoyersJames  Maxwell M.D.   On: 09/30/2014 14:50   Dg Femur Min 2 Views Left  10/02/2014   CLINICAL DATA:  Acute onset left hip pain radiating into the left leg. No known injuries.  EXAM: LEFT FEMUR 2 VIEWS  COMPARISON:  None.  FINDINGS: No evidence of acute, subacute or healed fractures. Well preserved bone mineral density. No intrinsic osseous abnormality involving the femur. Visualized hip joint intact though there is a small calcification adjacent to the acetabular rim superiorly. Visualized knee joint intact.  IMPRESSION: 1. Normal-appearing left femur. 2. Small calcification adjacent to the superior acetabular rim. Acetabular rim calcifications can be due to labral calcification/ossification, acetabular rim stress fractures, or os acetabuli. Os acetabuli and acetabular rim stress fractures put the patient at increased risk for pincer type femoroacetabular impingement.   Electronically Signed   By: Hulan Saashomas  Lawrence M.D.   On: 10/02/2014 20:42    Microbiology: Recent Results (from the past 240 hour(s))  Blood Culture (routine x 2)     Status: None   Collection Time: 09/30/14  2:15 PM  Result Value Ref Range Status   Specimen Description BLOOD RIGHT FOREARM  Final   Special Requests BOTTLES DRAWN AEROBIC ONLY 3CC  Final   Culture   Final    NO GROWTH 5 DAYS Performed at [facility]    Report Status 10/06/2014 FINAL  Final  Blood Culture (routine x 2)     Status: None   Collection Time: 09/30/14  2:34 PM  Result Value Ref Range Status   Specimen Description BLOOD LEFT HAND  Final    Special Requests BOTTLES DRAWN AEROBIC ONLY 3CC  Final   Culture   Final    NO GROWTH 5 DAYS Performed at [facility]    Report Status 10/06/2014 FINAL  Final  Urine culture     Status: None   Collection Time: 09/30/14  2:54 PM  Result Value Ref Range Status   Specimen Description URINE, CATHETERIZED  Final   Special Requests NONE  Final   Colony Count   Final    >=100,000 COLONIES/ML Performed at [facility]    Culture   Final    ESCHERICHIA COLI Performed at [facility]    Report Status 10/03/2014 FINAL  Final  Organism ID, Bacteria ESCHERICHIA COLI  Final      Susceptibility   Escherichia coli - MIC*    AMPICILLIN >=32 RESISTANT Resistant     CEFAZOLIN <=4 SENSITIVE Sensitive     CEFTRIAXONE <=1 SENSITIVE Sensitive     CIPROFLOXACIN 1 SENSITIVE Sensitive     GENTAMICIN >=16 RESISTANT Resistant     LEVOFLOXACIN 1 SENSITIVE Sensitive     NITROFURANTOIN <=16 SENSITIVE Sensitive     TOBRAMYCIN 8 INTERMEDIATE Intermediate     TRIMETH/SULFA >=320 RESISTANT Resistant     PIP/TAZO <=4 SENSITIVE Sensitive     * ESCHERICHIA COLI  MRSA PCR Screening     Status: None   Collection Time: 09/30/14  5:55 PM  Result Value Ref Range Status   MRSA by PCR NEGATIVE NEGATIVE Final    Comment:        The GeneXpert MRSA Assay (FDA approved for NASAL specimens only), is one component of a comprehensive MRSA colonization surveillance program. It is not intended to diagnose MRSA infection nor to guide or monitor treatment for MRSA infections.   Culture, blood (routine x 2)     Status: None (Preliminary result)   Collection Time: 10/04/14  4:10 PM  Result Value Ref Range Status   Specimen Description BLOOD CENTRAL LINE  Final   Special Requests BOTTLES DRAWN AEROBIC AND ANAEROBIC 10 CC  Final   Culture   Final           BLOOD CULTURE RECEIVED NO GROWTH TO DATE CULTURE WILL BE HELD FOR 5 DAYS BEFORE ISSUING A FINAL NEGATIVE REPORT Performed at [facility]    Report Status PENDING  Incomplete     Labs: Basic Metabolic Panel:  Recent Labs Lab 10/03/14 0241 10/04/14 0630 10/05/14 0910 10/06/14 0611 10/07/14 0610  NA 139 137 137 140 136  K 2.7* 2.8* 3.3* 3.8 3.7  CL 102 104 102 102 99  CO2 30 24 28 29 30   GLUCOSE 148* 148* 128* 94 110*  BUN <5* <5* <5* 6 <5*  CREATININE 0.79 0.72 0.58 0.65 0.63  CALCIUM 7.5* 8.0* 8.1* 8.6 8.2*  MG  --  1.5 1.8 1.9 1.8  PHOS  --  1.4* 3.5 3.8 4.0   Liver Function Tests:  Recent Labs Lab 10/03/14 0241 10/04/14 0630  AST 571* 362*  ALT 227* 214*  ALKPHOS 81 89  BILITOT 0.7 0.6  PROT 4.5* 4.8*  ALBUMIN 2.0* 1.9*   No results for input(s): LIPASE, AMYLASE in the last 168 hours. No results for input(s): AMMONIA in the last 168 hours. CBC:  Recent Labs Lab 10/02/14 0533 10/04/14 0230 10/05/14 0910 10/06/14 0611 10/07/14 0610  WBC 10.4 9.1 10.8* 8.8 9.4  NEUTROABS  --  7.1 8.7* 5.5 6.6  HGB 12.4 10.1* 10.7* 10.5* 9.5*  HCT 36.7 29.8* 31.7* 30.8* 28.4*  MCV 93.6 92.0 91.9 91.7 91.6  PLT 185 158 168 189 218   Cardiac Enzymes:  Recent Labs Lab 10/02/14 0751 10/02/14 1913 10/03/14 0844 10/04/14 0630 10/05/14 0910  CKTOTAL 25956* 24220* 12410* 7152* 3352*  CKMB  --   --  17.0* 8.8*  --    BNP: BNP (last 3 results)  Recent Labs  10/03/14 0844  BNP 310.1*    ProBNP (last 3 results) No results for input(s): PROBNP in the last 8760 hours.  CBG: No results for input(s): GLUCAP in the last 168 hours.     SignedMarlin Canary  [facility] 10/08/2014, 12:47 PM

## 2014-10-08 NOTE — Progress Notes (Signed)
VASCULAR LAB PRELIMINARY  PRELIMINARY  PRELIMINARY  PRELIMINARY  Left lower extremity venous Doppler completed.    Preliminary report:  There is no DVT or SVT noted in the left lower extremity.  Curren Mohrmann, RVT 10/08/2014, 2:53 PM

## 2014-10-08 NOTE — Progress Notes (Signed)
I met with pt at bedside. She is in agreement to intense inpt rehab program. Bed is available today. I will contact Dr. Vann to arrange admission today pending medical clearance. RN CM and SW are aware. [phone] 
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
## 2014-10-08 NOTE — Progress Notes (Signed)
[facility] unable to accept pt without insurance at this time, may be able to take Medicaid Pending, will proceed with Medicaid application through [employer].

## 2014-10-08 NOTE — Progress Notes (Addendum)
Patient WU:[id number]:Charlotte Strong      DOB: [birth year]      NWG:[id number]   [facility] Note    Subjective: States that pain doing a bit better today.  Less neuropathic sensation.  Moving bowels.  Cough and wheezing this morning.   Filed Vitals:   10/08/14 0649  BP: 147/76  Pulse: 93  Temp: 98.5 F (36.9 C)  Resp: 20   Physical exam: GEN: alert, NAD HEENT: [place], sclera anicteric CV: RRR LUNGS: wheezing ABD: soft, ND EXT: warm  CBC    Component Value Date/Time   WBC 9.4 10/07/2014 0610   RBC 3.10* 10/07/2014 0610   HGB 9.5* 10/07/2014 0610   HCT 28.4* 10/07/2014 0610   PLT 218 10/07/2014 0610   MCV 91.6 10/07/2014 0610   MCH 30.6 10/07/2014 0610   MCHC 33.5 10/07/2014 0610   RDW 14.5 10/07/2014 0610   LYMPHSABS 1.4 10/07/2014 0610   MONOABS 1.0 10/07/2014 0610   EOSABS 0.3 10/07/2014 0610   BASOSABS 0.0 10/07/2014 0610    CMP     Component Value Date/Time   NA 136 10/07/2014 0610   K 3.7 10/07/2014 0610   CL 99 10/07/2014 0610   CO2 30 10/07/2014 0610   GLUCOSE 110* 10/07/2014 0610   BUN <5* 10/07/2014 0610   CREATININE 0.63 10/07/2014 0610   CALCIUM 8.2* 10/07/2014 0610   PROT 4.8* 10/04/2014 0630   ALBUMIN 1.9* 10/04/2014 0630   AST 362* 10/04/2014 0630   ALT 214* 10/04/2014 0630   ALKPHOS 89 10/04/2014 0630   BILITOT 0.6 10/04/2014 0630   GFRNONAA >90 10/07/2014 0610   GFRAA >90 10/07/2014 0610      Assessment and plan: 39 yo female with polysubstance abuse admitted with below. Palliative consulted to assist with pain management and transition off PCA  1. Symptom Management:  1. Neuropathic Pain: d/c dilaudid today. Can make oxycodone q3h to help with successful transition of parenteral opioids.  Gabapentin can be increased in few days if needed and cymbalta could be increased to 60mg  next week if neuropathic symptoms persist.  2. Anxiety/Depression: on low dose xanax PRN. Added cymbalta to target above  as well 3. Wheezing- albuterol PRN. She reports oxycodone helps with this as well. Discussed with Dr Benjamine MolaVann  2. Psychosocial/Spiritual: Married and has 3 children[place] Plans on staying with her mom when able to go home.  I will sign off at this point as she successfully has transitioned off PCA.  If other questions or concerns arise, please feel free to contact me.   Orvis BrillAaron J. Leiliana Foody D.O. Palliative Medicine Team at [facility]  Pager: [phone] Team Phone: [phone]
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
## 2014-10-08 NOTE — Progress Notes (Signed)
Received pt. As a transfer,pt. Alert and oriented to the unit.Safety plan was explained,fall prevention plan explained and signed.Welcome video unavailable at the moment.Keep monitoring pt. Closely and assessing her needs.
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
## 2014-10-08 NOTE — Progress Notes (Signed)
Spoke with Marlene Lardhonda Lambert Financial Counselor. Next available appointment for Medicaid application is Monday 5-2 at 1:30 with Elyse Hsueresa Gilliard. Appointment made.

## 2014-10-09 ENCOUNTER — Inpatient Hospital Stay (HOSPITAL_COMMUNITY): Payer: Self-pay | Admitting: Occupational Therapy

## 2014-10-09 ENCOUNTER — Inpatient Hospital Stay (HOSPITAL_COMMUNITY): Payer: Medicaid Other

## 2014-10-09 DIAGNOSIS — M21332 Wrist drop, left wrist: Secondary | ICD-10-CM

## 2014-10-09 DIAGNOSIS — T79A22S Traumatic compartment syndrome of left lower extremity, sequela: Secondary | ICD-10-CM

## 2014-10-09 LAB — COMPREHENSIVE METABOLIC PANEL
ALBUMIN: 2 g/dL — AB (ref 3.5–5.2)
ALT: 63 U/L — AB (ref 0–35)
AST: 33 U/L (ref 0–37)
Alkaline Phosphatase: 60 U/L (ref 39–117)
Anion gap: 10 (ref 5–15)
BILIRUBIN TOTAL: 0.5 mg/dL (ref 0.3–1.2)
BUN: <5 mg/dL — ABNORMAL LOW (ref 6–23)
CO2: 27 mmol/L (ref 19–32)
CREATININE: 0.7 mg/dL (ref 0.50–1.10)
Calcium: 8.5 mg/dL (ref 8.4–10.5)
Chloride: 105 mmol/L (ref 96–112)
Glucose, Bld: 114 mg/dL — ABNORMAL HIGH (ref 70–99)
POTASSIUM: 3.6 mmol/L (ref 3.5–5.1)
SODIUM: 142 mmol/L (ref 135–145)
Total Protein: 5.3 g/dL — ABNORMAL LOW (ref 6.0–8.3)

## 2014-10-09 LAB — CBC WITH DIFFERENTIAL/PLATELET
BASOS ABS: 0 10*3/uL (ref 0.0–0.1)
Basophils Relative: 0 % (ref 0–1)
EOS PCT: 3 % (ref 0–5)
Eosinophils Absolute: 0.2 10*3/uL (ref 0.0–0.7)
HCT: 28.8 % — ABNORMAL LOW (ref 36.0–46.0)
HEMOGLOBIN: 9.7 g/dL — AB (ref 12.0–15.0)
LYMPHS ABS: 1.2 10*3/uL (ref 0.7–4.0)
Lymphocytes Relative: 17 % (ref 12–46)
MCH: 31 pg (ref 26.0–34.0)
MCHC: 33.7 g/dL (ref 30.0–36.0)
MCV: 92 fL (ref 78.0–100.0)
MONO ABS: 0.5 10*3/uL (ref 0.1–1.0)
Monocytes Relative: 8 % (ref 3–12)
Neutro Abs: 4.8 10*3/uL (ref 1.7–7.7)
Neutrophils Relative %: 71 % (ref 43–77)
PLATELETS: 326 10*3/uL (ref 150–400)
RBC: 3.13 MIL/uL — ABNORMAL LOW (ref 3.87–5.11)
RDW: 14.8 % (ref 11.5–15.5)
WBC: 6.8 10*3/uL (ref 4.0–10.5)

## 2014-10-09 LAB — CLOSTRIDIUM DIFFICILE BY PCR: CDIFFPCR: NEGATIVE

## 2014-10-09 LAB — CK: CK TOTAL: 514 U/L — AB (ref 7–177)

## 2014-10-09 MED ORDER — SODIUM CHLORIDE 0.9 % IJ SOLN
10.0000 mL | INTRAMUSCULAR | Status: DC | PRN
  Administered 2014-10-11 – 2014-10-14 (×3): 10 mL
  Filled 2014-10-09 (×3): qty 40

## 2014-10-09 MED ORDER — BENZONATATE 100 MG PO CAPS
100.0000 mg | ORAL_CAPSULE | Freq: Two times a day (BID) | ORAL | Status: DC
  Administered 2014-10-09 – 2014-10-15 (×13): 100 mg via ORAL
  Filled 2014-10-09 (×15): qty 1

## 2014-10-09 MED ORDER — OXYCODONE HCL 5 MG PO TABS
5.0000 mg | ORAL_TABLET | ORAL | Status: DC | PRN
  Administered 2014-10-09 – 2014-10-12 (×15): 10 mg via ORAL
  Filled 2014-10-09 (×15): qty 2

## 2014-10-09 NOTE — Care Management Note (Signed)
[facility] Individual Statement of Services  Patient Name:  Renato GailsCrystal Sermeno  Date:  10/09/2014  Welcome to the [facility].  Our goal is to provide you with an individualized program based on your diagnosis and situation, designed to meet your specific needs.  With this comprehensive rehabilitation program, you will be expected to participate in at least 3 hours of rehabilitation therapies Monday-Friday, with modified therapy programming on the weekends.  Your rehabilitation program will include the following services:  Physical Therapy (PT), Occupational Therapy (OT), 24 hour per day rehabilitation nursing, Therapeutic Recreaction (TR), Neuropsychology, Case Management (Social Worker), Rehabilitation Medicine, Nutrition Services and Pharmacy Services  Weekly team conferences will be held on Wednesday to discuss your progress.  Your Social Worker will talk with you frequently to get your input and to update you on team discussions.  Team conferences with you and your family in attendance may also be held.  Expected length of stay: 7-10 days Overall anticipated outcome: mod/i-supervision level  Depending on your progress and recovery, your program may change. Your Social Worker will coordinate services and will keep you informed of any changes. Your Social Worker's name and contact numbers are listed  below.  The following services may also be recommended but are not provided by the Inpatient Rehabilitation Center:    Home Health Rehabiltiation Services  Outpatient Rehabilitation Services    Arrangements will be made to provide these services after discharge if needed.  Arrangements include referral to agencies that provide these services.  Your insurance has been verified to be:  Medicaid pending Your primary doctor is:  None  Pertinent information will be shared with your doctor and your insurance company.  Social Worker:  Dossie DerBecky Sid Greener, SW  [phone] or ([phone]) [phone]  Information discussed with and copy given to patient by: Lucy Chrisupree, Haiven Nardone G, 10/09/2014, 11:21 AM
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
## 2014-10-09 NOTE — Progress Notes (Signed)
Orthopedic Tech Progress Note Patient Details:  Renato GailsCrystal Barbara Apr 18, 1976 [id number] Advanced called for brace orders Patient ID: Renato Gailsrystal Epler, female   DOB: Apr 18, 1976, 39 y.o.   MRN: [id number]   Orie Routsia R Thompson 10/09/2014, 10:30 AM

## 2014-10-09 NOTE — Interval H&P Note (Signed)
Charlotte Strong was admitted today to Inpatient Rehabilitation witthe diagnosis of radial , peroneal palsy, rhabdomyalysis.  The patient's history has been reviewed, patient examined, and there is no change in status.  Patient continues to be appropriate for intensive inpatient rehabilitation.  I have reviewed the patient's chart and labs.  Questions were answered to the patient's satisfaction.  Erick ColaceKIRSTEINS,Charlotte Strong 10/09/2014, 6:36 AM
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
## 2014-10-09 NOTE — Progress Notes (Signed)
Orthopedic Tech Progress Note Patient Details:  Charlotte GailsCrystal Strong [birth year] [id number]  Ortho Devices Type of Ortho Device: Velcro wrist splint Ortho Device/Splint Location: LUE Ortho Device/Splint Interventions: Application   Asia R Thompson 10/09/2014, 2:31 PM

## 2014-10-09 NOTE — Progress Notes (Signed)
Patient information reviewed and entered into eRehab system by Jilberto Vanderwall, RN, CRRN, PPS Coordinator.  Information including medical coding and functional independence measure will be reviewed and updated through discharge.    

## 2014-10-09 NOTE — Progress Notes (Signed)
Subjective/Complaints: Still coughing on pulmicort, levaquin, dx with PNA by IM and 2 wk f/u CXR recommended Still with foot > wrist pain during movement, no splints  Discussed her overdose, denied suicide attempt, took 5 "Xan bars (53m Xanax) with a perc"  Usually I just sleep a couple hours.  Prior 1 week drug treatment program  Review of Systems - Negative except cough and Left ankle pain  Objective: Vital Signs: Blood pressure 151/71, pulse 102, temperature 98.9 F (37.2 C), temperature source Oral, resp. rate 18, SpO2 97 %. Dg Chest Port 1 View  10/08/2014   CLINICAL DATA:  R06.2 (ICD-10-CM) - Wheezing Smokes 1 1/2 packes per day Cough Shortness of breath Symptoms 1 day  EXAM: PORTABLE CHEST - 1 VIEW  COMPARISON:  10/01/2014  FINDINGS: Right arm PICC is been placed to the distal SVC. Patchy bilateral airspace disease, improved in the left mid and upper lung, but worsened in the right mid and lower lung since previous exam with progressive consolidation. Heart size remains normal. No effusion. Visualized skeletal structures are unremarkable.  IMPRESSION: 1. Asymmetric airspace disease, worse on the right and improved on the left since previous exam.   Electronically Signed   By: DLucrezia EuropeM.D.   On: 10/08/2014 09:57   Results for orders placed or performed during the hospital encounter of 10/08/14 (from the past 72 hour(s))  Urinalysis, Routine w reflex microscopic     Status: None   Collection Time: 10/08/14  8:36 PM  Result Value Ref Range   Color, Urine YELLOW YELLOW   APPearance CLEAR CLEAR   Specific Gravity, Urine 1.012 1.005 - 1.030   pH 7.0 5.0 - 8.0   Glucose, UA NEGATIVE NEGATIVE mg/dL   Hgb urine dipstick NEGATIVE NEGATIVE   Bilirubin Urine NEGATIVE NEGATIVE   Ketones, ur NEGATIVE NEGATIVE mg/dL   Protein, ur NEGATIVE NEGATIVE mg/dL   Urobilinogen, UA 1.0 0.0 - 1.0 mg/dL   Nitrite NEGATIVE NEGATIVE   Leukocytes, UA NEGATIVE NEGATIVE    Comment: MICROSCOPIC NOT DONE  ON URINES WITH NEGATIVE PROTEIN, BLOOD, LEUKOCYTES, NITRITE, OR GLUCOSE <1000 mg/dL.  CBC WITH DIFFERENTIAL     Status: Abnormal   Collection Time: 10/09/14  4:55 AM  Result Value Ref Range   WBC 6.8 4.0 - 10.5 K/uL   RBC 3.13 (L) 3.87 - 5.11 MIL/uL   Hemoglobin 9.7 (L) 12.0 - 15.0 g/dL   HCT 28.8 (L) 36.0 - 46.0 %   MCV 92.0 78.0 - 100.0 fL   MCH 31.0 26.0 - 34.0 pg   MCHC 33.7 30.0 - 36.0 g/dL   RDW 14.8 11.5 - 15.5 %   Platelets 326 150 - 400 K/uL    Comment: REPEATED TO VERIFY   Neutrophils Relative % 71 43 - 77 %   Neutro Abs 4.8 1.7 - 7.7 K/uL   Lymphocytes Relative 17 12 - 46 %   Lymphs Abs 1.2 0.7 - 4.0 K/uL   Monocytes Relative 8 3 - 12 %   Monocytes Absolute 0.5 0.1 - 1.0 K/uL   Eosinophils Relative 3 0 - 5 %   Eosinophils Absolute 0.2 0.0 - 0.7 K/uL   Basophils Relative 0 0 - 1 %   Basophils Absolute 0.0 0.0 - 0.1 K/uL  Comprehensive metabolic panel     Status: Abnormal   Collection Time: 10/09/14  4:55 AM  Result Value Ref Range   Sodium 142 135 - 145 mmol/L   Potassium 3.6 3.5 - 5.1 mmol/L  Chloride 105 96 - 112 mmol/L   CO2 27 19 - 32 mmol/L   Glucose, Bld 114 (H) 70 - 99 mg/dL   BUN <5 (L) 6 - 23 mg/dL   Creatinine, Ser 0.70 0.50 - 1.10 mg/dL   Calcium 8.5 8.4 - 10.5 mg/dL   Total Protein 5.3 (L) 6.0 - 8.3 g/dL   Albumin 2.0 (L) 3.5 - 5.2 g/dL   AST 33 0 - 37 U/L   ALT 63 (H) 0 - 35 U/L   Alkaline Phosphatase 60 39 - 117 U/L   Total Bilirubin 0.5 0.3 - 1.2 mg/dL   GFR calc non Af Amer >90 >90 mL/min   GFR calc Af Amer >90 >90 mL/min    Comment: (NOTE) The eGFR has been calculated using the CKD EPI equation. This calculation has not been validated in all clinical situations. eGFR's persistently <90 mL/min signify possible Chronic Kidney Disease.    Anion gap 10 5 - 15  CK     Status: Abnormal   Collection Time: 10/09/14  4:55 AM  Result Value Ref Range   Total CK 514 (H) 7 - 177 U/L     HEENT: normal Cardio: RRR and no murmur Resp: CTA B/L and  unlabored GI: BS positive and NT, ND Extremity:  No Edema Skin:   Wound Dorsum of wrist wound / blisters bandaged Neuro: Alert/Oriented, Anxious, Abnormal Sensory entire hand and foot  On left with numbess, L thumb actually spared and Abnormal Motor 3- L wrist  And finger ext and finger flexors, Left foot 0/5 ankle DF toe ext, trace PF Musc/Skel:  Swelling Left foot and Left wrist Gen NAD but c/o pain with movement   Assessment/Plan: 1. Functional deficits secondary to multiple compression neuropathies and rhabdomyalysis which require 3+ hours per day of interdisciplinary therapy in a comprehensive inpatient rehab setting. Physiatrist is providing close team supervision and 24 hour management of active medical problems listed below. Physiatrist and rehab team continue to assess barriers to discharge/monitor patient progress toward functional and medical goals. Ask Neuropsych to see for substance abuse and anxiety FIM:                   Comprehension Comprehension Mode: Auditory Comprehension: 7-Follows complex conversation/direction: With no assist  Expression Expression Mode: Verbal Expression: 7-Expresses complex ideas: With no assist  Social Interaction Social Interaction: 6-Interacts appropriately with others with medication or extra time (anti-anxiety, antidepressant).  Problem Solving Problem Solving: 5-Solves complex 90% of the time/cues < 10% of the time  Memory Memory: 7-Complete Independence: No helper  Medical Problem List and Plan: 1. 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 DVT Prophylaxis/Anticoagulation: Pharmaceutical: Lovenox 3. Pain Management: Started on neurontin and cymbalta yesterday--titrate upward for symptom management. Continue oxycodone prn for now.  4. Anxiety/Mood: team  to provide ego support and encouragement. LCSW to follow for evaluation and support. Will continue low dose xanax for now.  5. Neuropsych: This patient is capable of making decisions on her own behalf. 6. Skin/Wound Care: silvadene to foot wound. Continue pressure relief measures.  7. Fluids/Electrolytes/Nutrition: Monitor I/O. Offer supplements between meals. Check lytes in am.  8. Polysubstance abuse: Need to limit narcotic due to prior history. Attempted to start drug rehab program 06/2013? 9. Sepsis with Aspiration PNA/ UTI: Encourage IS. Antibiotics changed to levaquin today. Progressive consolidation--question fluid overload. Will check 2 view  CXR 10. Rhabdomyolysis: Recheck lytes and CK .  11. ABLA: Check follow up labs in am.  LOS (Days) 1 A FACE TO FACE EVALUATION WAS PERFORMED  KIRSTEINS,ANDREW E 10/09/2014, 7:01 AM

## 2014-10-10 ENCOUNTER — Inpatient Hospital Stay (HOSPITAL_COMMUNITY): Payer: Self-pay | Admitting: Occupational Therapy

## 2014-10-10 ENCOUNTER — Inpatient Hospital Stay (HOSPITAL_COMMUNITY): Payer: Self-pay | Admitting: Physical Therapy

## 2014-10-10 ENCOUNTER — Encounter (HOSPITAL_COMMUNITY): Payer: Self-pay | Admitting: Occupational Therapy

## 2014-10-10 DIAGNOSIS — G5772 Causalgia of left lower limb: Secondary | ICD-10-CM

## 2014-10-10 LAB — CULTURE, BLOOD (ROUTINE X 2): Culture: NO GROWTH

## 2014-10-10 LAB — URINE CULTURE
COLONY COUNT: NO GROWTH
Culture: NO GROWTH

## 2014-10-10 MED ORDER — METHYLPREDNISOLONE 4 MG PO TBPK
4.0000 mg | ORAL_TABLET | ORAL | Status: AC
  Administered 2014-10-10: 4 mg via ORAL

## 2014-10-10 MED ORDER — ALTEPLASE 2 MG IJ SOLR
2.0000 mg | Freq: Once | INTRAMUSCULAR | Status: AC
  Administered 2014-10-10: 2 mg
  Filled 2014-10-10: qty 2

## 2014-10-10 MED ORDER — METHYLPREDNISOLONE 4 MG PO TBPK
4.0000 mg | ORAL_TABLET | Freq: Four times a day (QID) | ORAL | Status: AC
  Administered 2014-10-12 – 2014-10-15 (×10): 4 mg via ORAL

## 2014-10-10 MED ORDER — METHYLPREDNISOLONE 4 MG PO TBPK
8.0000 mg | ORAL_TABLET | Freq: Every evening | ORAL | Status: AC
  Administered 2014-10-10: 8 mg via ORAL

## 2014-10-10 MED ORDER — METHYLPREDNISOLONE 4 MG PO TBPK
8.0000 mg | ORAL_TABLET | Freq: Every evening | ORAL | Status: AC
  Administered 2014-10-11: 8 mg via ORAL

## 2014-10-10 MED ORDER — METHYLPREDNISOLONE 4 MG PO TBPK
4.0000 mg | ORAL_TABLET | Freq: Three times a day (TID) | ORAL | Status: AC
  Administered 2014-10-11 (×3): 4 mg via ORAL

## 2014-10-10 MED ORDER — METHYLPREDNISOLONE 4 MG PO TBPK
8.0000 mg | ORAL_TABLET | Freq: Every morning | ORAL | Status: AC
  Administered 2014-10-10: 8 mg via ORAL
  Filled 2014-10-10: qty 21

## 2014-10-10 NOTE — IPOC Note (Addendum)
Overall Plan of Care [facility](IPOC) Patient Details Name: Renato GailsCrystal Blas MRN: [id number] DOB: 07-07-1975  Admitting Diagnosis: Deconditioned   Hospital Problems: Active Problems:   Compartment syndrome of lower extremity     Functional Problem List: Nursing Behavior, Safety, Sensory, Bowel, Endurance, Medication Management, Pain  PT Balance, Pain, Sensory, Endurance  OT Balance, Sensory, Endurance, Motor, Pain, Safety  SLP    TR Activity tolerance, functional mobility, balance, safety, pain       Basic ADL's: OT Bathing, Grooming, Dressing, Toileting     Advanced  ADL's: OT Simple Meal Preparation, Laundry     Transfers: PT Bed Mobility, Bed to Chair, Customer service managerCar  OT Toilet, Tub/Shower     Locomotion: PT Ambulation, Wheelchair Mobility     Additional Impairments: OT Fuctional Use of Upper Extremity  SLP        TR      Anticipated Outcomes Item Anticipated Outcome  Self Feeding n/a  Swallowing      Basic self-care  Mod I - supervision  Toileting  Mod I    Bathroom Transfers toilet - mod I  shower- supervision  Bowel/Bladder  indep  Transfers  modified independent  Locomotion  modified independent household ambulation; w/c for community  Communication     Cognition     Pain  <3  Safety/Judgment  mod I   Therapy Plan: PT Intensity: Minimum of 1-2 x/day ,45 to 90 minutes PT Frequency: 5 out of 7 days PT Duration Estimated Length of Stay: 7 to 10 days OT Intensity: Minimum of 1-2 x/day, 45 to 90 minutes OT Frequency: 5 out of 7 days OT Duration/Estimated Length of Stay: 7-10 days  TR Duration/ELOS:  7 Days TR Frequency:  Min 1 time per week >20 minutes        Team Interventions: Nursing Interventions Patient/Family Education, Skin Care/Wound Management, Medication Management, Pain Management, Bowel Management, Psychosocial Support  PT interventions Ambulation/gait training, Discharge planning, Functional mobility training, Neuromuscular re-education, Pain  management, Patient/family education, Splinting/orthotics, Therapeutic Activities, Therapeutic Exercise, UE/LE Strength taining/ROM, Wheelchair propulsion/positioning  OT Interventions Balance/vestibular training, Discharge planning, Pain management, Self Care/advanced ADL retraining, Therapeutic Activities, UE/LE Coordination activities, Functional mobility training, Patient/family education, Therapeutic Exercise, DME/adaptive equipment instruction, Neuromuscular re-education, Community reintegration, Psychosocial support, UE/LE Strength taining/ROM  SLP Interventions    TR Interventions Recreation/leisure participation, Balance/Vestibular training, functional mobility, therapeutic activities, UE/LE strength/coordination, w/c mobility, community reintegration, pt/family education, adaptive equipment instruction/use, discharge planning, psychosocial support  SW/CM Interventions Discharge Planning, Facilities managersychosocial Support, Patient/Family Education    Team Discharge Planning: Destination: PT-Home ,OT- Home , SLP-  Projected Follow-up: PT-Home health PT, OT-  24 hour supervision/assistance, SLP-  Projected Equipment Needs: PT-To be determined, OT- 3 in 1 bedside comode, Tub/shower seat, SLP-  Equipment Details: PT- , OT-  Patient/family involved in discharge planning: PT- Patient,  OT-Patient, SLP-   MD ELOS: 7d Medical Rehab Prognosis:  Excellent Assessment: 39 year old female found unresponsive and bathroom, toxicology revealed benzodiazepine and opioid overdose. Patient developed foot drop as well as wrist drop on the left side. Now requiring compress of intensive inpatient rotation with PTOT , 24 7 rehabilitation RN and M.D. Pain management is an issue.    Treatment team will focus on ADLs and mobility with goals set at Mod I   See Team Conference Notes for weekly updates to the plan of care

## 2014-10-10 NOTE — Progress Notes (Signed)
Physical Therapy Session Note  Patient Details  Name: Charlotte Strong MRN: [id number] Date of Birth: 11-Dec-1975  Today's Date: 10/10/2014 PT Individual Time: 0900-1000, 1300-1345 PT Individual Time Calculation (min): 60 min , 45 min  Short Term Goals: Week 1:  PT Short Term Goal 1 (Week 1): STG's = LTG's due to ELOS  Skilled Therapeutic Interventions/Progress Updates:   Pt most limited by pain in session with inconsistent and very expressive pain responses. Pt does benefit from ventilatory cues in session for decreased pain with movement. Pt best tolerated prone positioning and notices centralization with extension. Responses consistent with McKenzie L/S derangement that is best managed with repeated extension. Continue to monitor response with these exercises. Pt would continue to benefit from individualized skilled PT services to increase functional mobility.   Therapy Documentation Precautions:  Precautions Precautions: Fall Precaution Comments: Picc line R arm, splint L UE, low threshhold for pain and frustration, impulsive Required Braces or Orthoses: Other Brace/Splint Other Brace/Splint: Splint L UE resting hand Restrictions Weight Bearing Restrictions: No Pain: Pain Assessment Pain Score: 9  Pain Location: Leg Pain Orientation: Left Pain Descriptors / Indicators: Aching Mobility:  Pt performs transfers with Min A and cues for weight shift, safety, attention, and technique Locomotion : Ambulation Ambulation/Gait Assistance: 4: Min assist (for 3') 10' and 5' with platform RW cues for safety, pacing, and ventilation Other Treatments:  Tx 1: Pt performs static and dynamic sitting and standing balance tasks with dual motor tasks including weight shifting, grasping, and reaching within functional context. Pt educated on rehab plan, ventilation strategies, safety in mobility, and progressing mobility. Pt performs static standing 30'x3 in session. Prone positioning and then sidelying  positioning performed 17' . Prone hip ER/IR, glute sets, toes curls 2x10 performed. Prone on elbows performed x2'. Pt performs hip ER in sidelying 2x10, diaphragmatic breathing with 5# weight, heel slides, seated scap retraction 2x10. Positioning trialed in seated and lying positions for pain relief.   Tx 2: Prone positioning with L/S ext on wedge with pillows to increase L/S extension 5'x2. Gentle L/S myofascial release performed. Pt performs standing balance 1'x3. Pt performs partial LAQs, ankle pump AAROM, seated knee flexion 2x10. Gastroc stretch 2x30" performed. Pt educated on pain science in session. LLE retrograde massage performed.  See FIM for current functional status  Therapy/Group: Individual Therapy  Christia ReadingKinney, Shikira Folino G 10/10/2014, 12:44 PM

## 2014-10-10 NOTE — Progress Notes (Signed)
Subjective/Complaints: Still coughing on pulmicort, levaquin, dx with PNA by IM and 2 wk f/u CXR recommended Still with foot > wrist pain during movement, wrist better with splint     Review of Systems - Negative except cough and Left ankle pain  Objective: Vital Signs: Blood pressure 140/74, pulse 96, temperature 98.5 F (36.9 C), temperature source Oral, resp. rate 20, SpO2 100 %. Dg Chest 2 View  10/09/2014   CLINICAL DATA:  Cough for 3 days.  Chest pain  EXAM: CHEST  2 VIEW  COMPARISON:  10/08/2014  FINDINGS: There is a right arm PICC line with tip in the projection of the cavoatrial junction. Heart size appears within normal limits. Bilateral multi focal airspace opacities are again noted right greater than left. Compared with previous exam there has been no significant change in aeration to the lungs.  IMPRESSION: 1. Persistent, bilateral asymmetric airspace opacities compatible with multifocal pneumonia.   Electronically Signed   By: Kerby Moors M.D.   On: 10/09/2014 11:39   Dg Chest Port 1 View  10/08/2014   CLINICAL DATA:  R06.2 (ICD-10-CM) - Wheezing Smokes 1 1/2 packes per day Cough Shortness of breath Symptoms 1 day  EXAM: PORTABLE CHEST - 1 VIEW  COMPARISON:  10/01/2014  FINDINGS: Right arm PICC is been placed to the distal SVC. Patchy bilateral airspace disease, improved in the left mid and upper lung, but worsened in the right mid and lower lung since previous exam with progressive consolidation. Heart size remains normal. No effusion. Visualized skeletal structures are unremarkable.  IMPRESSION: 1. Asymmetric airspace disease, worse on the right and improved on the left since previous exam.   Electronically Signed   By: Lucrezia Europe M.D.   On: 10/08/2014 09:57   Results for orders placed or performed during the hospital encounter of 10/08/14 (from the past 72 hour(s))  Urinalysis, Routine w reflex microscopic     Status: None   Collection Time: 10/08/14  8:36 PM  Result  Value Ref Range   Color, Urine YELLOW YELLOW   APPearance CLEAR CLEAR   Specific Gravity, Urine 1.012 1.005 - 1.030   pH 7.0 5.0 - 8.0   Glucose, UA NEGATIVE NEGATIVE mg/dL   Hgb urine dipstick NEGATIVE NEGATIVE   Bilirubin Urine NEGATIVE NEGATIVE   Ketones, ur NEGATIVE NEGATIVE mg/dL   Protein, ur NEGATIVE NEGATIVE mg/dL   Urobilinogen, UA 1.0 0.0 - 1.0 mg/dL   Nitrite NEGATIVE NEGATIVE   Leukocytes, UA NEGATIVE NEGATIVE    Comment: MICROSCOPIC NOT DONE ON URINES WITH NEGATIVE PROTEIN, BLOOD, LEUKOCYTES, NITRITE, OR GLUCOSE <1000 mg/dL.  Urine culture     Status: None   Collection Time: 10/08/14  8:36 PM  Result Value Ref Range   Specimen Description URINE, CLEAN CATCH    Special Requests levaquin    Colony Count NO GROWTH Performed at [facility]     Culture NO GROWTH Performed at [facility]     Report Status 10/10/2014 FINAL   Clostridium Difficile by PCR     Status: None   Collection Time: 10/09/14  4:26 AM  Result Value Ref Range   C difficile by pcr NEGATIVE NEGATIVE  CBC WITH DIFFERENTIAL     Status: Abnormal   Collection Time: 10/09/14  4:55 AM  Result Value Ref Range   WBC 6.8 4.0 - 10.5 K/uL   RBC 3.13 (L) 3.87 - 5.11 MIL/uL   Hemoglobin 9.7 (L) 12.0 - 15.0 g/dL   HCT 28.8 (L)  36.0 - 46.0 %   MCV 92.0 78.0 - 100.0 fL   MCH 31.0 26.0 - 34.0 pg   MCHC 33.7 30.0 - 36.0 g/dL   RDW 14.8 11.5 - 15.5 %   Platelets 326 150 - 400 K/uL    Comment: REPEATED TO VERIFY   Neutrophils Relative % 71 43 - 77 %   Neutro Abs 4.8 1.7 - 7.7 K/uL   Lymphocytes Relative 17 12 - 46 %   Lymphs Abs 1.2 0.7 - 4.0 K/uL   Monocytes Relative 8 3 - 12 %   Monocytes Absolute 0.5 0.1 - 1.0 K/uL   Eosinophils Relative 3 0 - 5 %   Eosinophils Absolute 0.2 0.0 - 0.7 K/uL   Basophils Relative 0 0 - 1 %   Basophils Absolute 0.0 0.0 - 0.1 K/uL  Comprehensive metabolic panel     Status: Abnormal   Collection Time: 10/09/14  4:55 AM  Result Value Ref Range   Sodium 142 135  - 145 mmol/L   Potassium 3.6 3.5 - 5.1 mmol/L   Chloride 105 96 - 112 mmol/L   CO2 27 19 - 32 mmol/L   Glucose, Bld 114 (H) 70 - 99 mg/dL   BUN <5 (L) 6 - 23 mg/dL   Creatinine, Ser 0.70 0.50 - 1.10 mg/dL   Calcium 8.5 8.4 - 10.5 mg/dL   Total Protein 5.3 (L) 6.0 - 8.3 g/dL   Albumin 2.0 (L) 3.5 - 5.2 g/dL   AST 33 0 - 37 U/L   ALT 63 (H) 0 - 35 U/L   Alkaline Phosphatase 60 39 - 117 U/L   Total Bilirubin 0.5 0.3 - 1.2 mg/dL   GFR calc non Af Amer >90 >90 mL/min   GFR calc Af Amer >90 >90 mL/min    Comment: (NOTE) The eGFR has been calculated using the CKD EPI equation. This calculation has not been validated in all clinical situations. eGFR's persistently <90 mL/min signify possible Chronic Kidney Disease.    Anion gap 10 5 - 15  CK     Status: Abnormal   Collection Time: 10/09/14  4:55 AM  Result Value Ref Range   Total CK 514 (H) 7 - 177 U/L     HEENT: normal Cardio: RRR and no murmur Resp: CTA B/L and unlabored GI: BS positive and NT, ND Extremity:  No Edema Skin:   Wound Dorsum of wrist wound / blisters bandaged Neuro: Alert/Oriented, Anxious, Abnormal Sensory entire hand and foot  On left with numbess, L thumb actually spared and Abnormal Motor 3- L wrist  And finger ext and finger flexors, Left foot 0/5 ankle DF toe ext, trace PF Musc/Skel:  Swelling Left foot and improved Left wrist Gen NAD but c/o pain with movement   Assessment/Plan: 1. Functional deficits secondary to multiple compression neuropathies and rhabdomyalysis which require 3+ hours per day of interdisciplinary therapy in a comprehensive inpatient rehab setting. Physiatrist is providing close team supervision and 24 hour management of active medical problems listed below. Physiatrist and rehab team continue to assess barriers to discharge/monitor patient progress toward functional and medical goals. Suspect causalgia LLE, add medrol burst and taper FIM: FIM - Bathing Bathing Steps Patient Completed:  Chest, Right Arm, Left Arm, Abdomen, Front perineal area, Buttocks, Right upper leg, Left upper leg, Right lower leg (including foot) Bathing: 4: Min-Patient completes 8-9 [id number] parts or 75+ percent  FIM - Upper Body Dressing/Undressing Upper body dressing/undressing: 0: Wears gown/pajamas-no public clothing FIM -  Lower Body Dressing/Undressing Lower body dressing/undressing: 0: Wears Interior and spatial designer     FIM - Radio producer Devices: Recruitment consultant Transfers: 4-To toilet/BSC: Min A (steadying Pt. > 75%)  FIM - Bed/Chair Transfer Bed/Chair Transfer Assistive Devices: Bed rails, HOB elevated Bed/Chair Transfer: 4: Supine > Sit: Min A (steadying Pt. > 75%/lift 1 leg), 4: Sit > Supine: Min A (steadying pt. > 75%/lift 1 leg), 4: Bed > Chair or W/C: Min A (steadying Pt. > 75%), 4: Chair or W/C > Bed: Min A (steadying Pt. > 75%)  FIM - Locomotion: Wheelchair Locomotion: Wheelchair: 0: Activity did not occur FIM - Locomotion: Ambulation Locomotion: Ambulation Assistive Devices: Engineer, agricultural Ambulation/Gait Assistance: 4: Min assist Locomotion: Ambulation: 1: Travels less than 50 ft with minimal assistance (Pt.>75%)  Comprehension Comprehension Mode: Auditory Comprehension: 6-Follows complex conversation/direction: With extra time/assistive device  Expression Expression Mode: Verbal Expression: 6-Expresses complex ideas: With extra time/assistive device  Social Interaction Social Interaction: 6-Interacts appropriately with others with medication or extra time (anti-anxiety, antidepressant).  Problem Solving Problem Solving: 5-Solves complex 90% of the time/cues < 10% of the time  Memory Memory: 7-Complete Independence: No helper  Medical Problem List and Plan: 1. Functional deficits secondary to drug overdose resulting in rhabomyolysis and left lower ext anterior compartment syndrome---pt with subsequent peroneal nerve injury  and right radial compression neuropathy 2. DVT Prophylaxis/Anticoagulation: Pharmaceutical: Lovenox 3. Pain Management: Started on neurontin and cymbalta yesterday--titrate upward for symptom management.Add medrol for causalgia Continue oxycodone prn for now.  4. Anxiety/Mood: team to provide ego support and encouragement. LCSW to follow for evaluation and support. Will continue low dose xanax for now.  5. Neuropsych: This patient is capable of making decisions on her own behalf. 6. Skin/Wound Care: silvadene to foot wound. Continue pressure relief measures.  7. Fluids/Electrolytes/Nutrition: Monitor I/O. Offer supplements between meals. Check lytes in am.  8. Polysubstance abuse: Need to limit narcotic due to prior history. Attempted to start drug rehab program 06/2013? 9. Sepsis with Aspiration PNA/ UTI: Encourage IS. Antibiotics changed to levaquin today. Progressive consolidation--question fluid overload. Will check 2 view CXR 10. Rhabdomyolysis: Recheck lytes and CK .  11. ABLA: Check follow up labs in am.  LOS (Days) 2 A FACE TO FACE EVALUATION WAS PERFORMED  Wesam Gearhart E 10/10/2014, 7:28 AM

## 2014-10-10 NOTE — Progress Notes (Signed)
Occupational Therapy Session Note  Patient Details  Name: Charlotte GailsCrystal Strong MRN: [id number] Date of Birth: [birth year]  Today's Date: 10/10/2014 OT Individual Time: 1100-1200 and 1530-1600 OT Individual Time Calculation (min): 60 min and 30 min    Short Term Goals: Week 1:  OT Short Term Goal 1 (Week 1): STG= LTGs secondary to short LOS  Skilled Therapeutic Interventions/Progress Updates:  Session 1: Upon entering the room, pt seated in recliner chair with legs elevated and 4/10 pain in L hip. Pt agreeable to shower this session. PICC line and dressings covered. Min A stand pivot to wheelchair from recliner chair . Pt also transferring onto TTB in walk in shower with min A from wheelchair. Basin placed under L foot for neutral position as well as towel placed under L hip during bathing secondary to increased pain. Pt performed bathing at supervision level. Transferring back to wheelchair to don hospital gown as no clothing available. Pt standing at sink to brush teeth with platform walker and increasing pain for ~ 1 minute. Pt initially bearing weight on L LE but as pain increased pt lifting L LE from floor. [facility] by snapping and tying hospital gown with B hands. Pt seated in recliner chair with call bell and all needed items when RN arrived with medications and dressing change.   Session 2: Upon entering the room, pt seated in recliner chair. Pt ambulating with platform walker to bathroom with multiple stops as she lifts L LE from the floor secondary to L hip pain with ambulating. Pt performing min A transfer onto toilet for void. Steady assist while performing clothing management and hygiene. Pt ambulating back to recliner chair in same manner. OT educated pt on use of red, medium soft resistance theraputty for resistive [facility] and hand strengthening exercises. Pt requiring multiple rest breaks from muscle fatigue. Varied sizes of coins placed on table with pt attempting to pick up  with pincher grasp with difficulty. She was more successful with three jaw chuck grasp and able to pick up and place into slotted cup without drops for 8 items. Pt unable to perform palmar translation task with quarter this session. She was able to stack one penny on top of another with greater than 3 attempts. Pt transferred back to bed with min A. Sit >supine with supervision and call bell within reach upon exiting the room.   Therapy Documentation Precautions:  Precautions Precautions: Fall Precaution Comments: Picc line R arm, splint L UE.  Pt. becomes easily frustrated when in pain,, low threshhold for frustration Required Braces or Orthoses: Other Brace/Splint Other Brace/Splint: Splint L UE resting hand Restrictions Weight Bearing Restrictions: No Pain: Pain Assessment Pain Score: 0-No pain Faces Pain Scale: No hurt PAINAD (Pain Assessment in Advanced Dementia) Breathing: normal  See FIM for current functional status  Therapy/Group: Individual Therapy  Lowella Gripittman, Stephon Weathers L 10/10/2014, 12:35 PM

## 2014-10-10 NOTE — Progress Notes (Signed)
IV team assessed patient PICC line.  Line occluded. TPA ordered for IV team administration.

## 2014-10-10 NOTE — Progress Notes (Signed)
Productive cough, thick white/clear sputum. Requested PRN neb treatment at 0151 for complaint of wheezing, effective. Difficulty falling asleep-PRN trazodone 50mg  given at 0156. C/O "burning" pain to left hip-10mg  oxy IR given at 0202 with relief. Left PRAFO in place. Alfredo MartinezMurray, Ailsa Mireles A

## 2014-10-11 ENCOUNTER — Inpatient Hospital Stay (HOSPITAL_COMMUNITY): Payer: Self-pay | Admitting: Physical Therapy

## 2014-10-11 DIAGNOSIS — M21372 Foot drop, left foot: Secondary | ICD-10-CM

## 2014-10-11 NOTE — Plan of Care (Signed)
Problem: RH PAIN MANAGEMENT Goal: RH STG PAIN MANAGED AT OR BELOW PT'S PAIN GOAL <3  Outcome: Not Progressing Rating pain consistently > 3.

## 2014-10-11 NOTE — Progress Notes (Signed)
PRN vicodin given at 1923 for C/O "hip pain". At 2237, PRN robaxin and ambien given. Rested comfortably during night. At 0400 covers wet from sweat, spot check CBG=219. Patient without complaint of. Alfredo MartinezMurray, Quantavia Frith A

## 2014-10-11 NOTE — Progress Notes (Signed)
Pain managed with PRN oxy IR, last given at 2203. At 0036 PRN trazadone 50mg 's given. [facility] 2 hours to void, not a new problem per patient. Alfredo MartinezMurray, Excell Neyland A

## 2014-10-11 NOTE — Progress Notes (Signed)
Physical Therapy Session Note  Patient Details  Name: Charlotte GailsCrystal Grossi MRN: [id number] Date of Birth: 08/25/1975  Today's Date: 10/11/2014 PT Individual Time: 0930-1015 PT Individual Time Calculation (min): 45 min   Short Term Goals: Week 1:  PT Short Term Goal 1 (Week 1): STG's = LTG's due to ELOS  Skilled Therapeutic Interventions/Progress Updates:   Pt continues to be limited by pain with large and inconsistent fluctuations throughout session. Impulsivity also limiting, particularly in gait where she lets go of walker and grabs leg. Pt still seems to get most pain relief with prone ext with wedge and pillows. Pt would continue to benefit from individualized skilled PT services to increase functional mobility.  Therapy Documentation Precautions:  Precautions Precautions: Fall Precaution Comments: Picc line R arm, splint L UE, low threshhold for pain and frustration, impulsive Required Braces or Orthoses: Other Brace/Splint Other Brace/Splint: Splint L UE resting hand Restrictions Weight Bearing Restrictions: No Pain: Pain Assessment Pain Assessment: No/denies pain Pain Score: 8  Pain Type: Acute pain;Neuropathic pain Pain Location: Leg Pain Orientation: Left Pain Descriptors / Indicators: Aching Pain Frequency: Intermittent Pain Onset: On-going Pain Intervention(s):  (graded activity and repositioning to 3/10) Multiple Pain Sites: No Mobility:  Pt performs transfers with cues for safety and attention Locomotion : Ambulation Ambulation/Gait Assistance: 4: Min assist (for 10') x2 Other Treatments:  Pt performs prone ext with wedge and pillows x8'. Prone knee flex, bridging, LTR, ankle pumps in hooklying, hip ER AROM 2x10. Static standing 1'x4. Pt performs standing weight shifts 2x10.  See FIM for current functional status  Therapy/Group: Individual Therapy  Christia ReadingKinney, Najee Manninen G 10/11/2014, 10:05 AM

## 2014-10-11 NOTE — Progress Notes (Signed)
Subjective/Complaints: Still coughing on pulmicort, levaquin, dx with PNA by IM and 2 wk f/u CXR recommended Gets nebulizer every morning Overall less coughing    Review of Systems - Negative except cough and Left ankle pain  Objective: Vital Signs: Blood pressure 134/79, pulse 94, temperature 98.8 F (37.1 C), temperature source Oral, resp. rate 18, SpO2 100 %. Dg Chest 2 View  10/09/2014   CLINICAL DATA:  Cough for 3 days.  Chest pain  EXAM: CHEST  2 VIEW  COMPARISON:  10/08/2014  FINDINGS: There is a right arm PICC line with tip in the projection of the cavoatrial junction. Heart size appears within normal limits. Bilateral multi focal airspace opacities are again noted right greater than left. Compared with previous exam there has been no significant change in aeration to the lungs.  IMPRESSION: 1. Persistent, bilateral asymmetric airspace opacities compatible with multifocal pneumonia.   Electronically Signed   By: Kerby Moors M.D.   On: 10/09/2014 11:39   Results for orders placed or performed during the hospital encounter of 10/08/14 (from the past 72 hour(s))  Urinalysis, Routine w reflex microscopic     Status: None   Collection Time: 10/08/14  8:36 PM  Result Value Ref Range   Color, Urine YELLOW YELLOW   APPearance CLEAR CLEAR   Specific Gravity, Urine 1.012 1.005 - 1.030   pH 7.0 5.0 - 8.0   Glucose, UA NEGATIVE NEGATIVE mg/dL   Hgb urine dipstick NEGATIVE NEGATIVE   Bilirubin Urine NEGATIVE NEGATIVE   Ketones, ur NEGATIVE NEGATIVE mg/dL   Protein, ur NEGATIVE NEGATIVE mg/dL   Urobilinogen, UA 1.0 0.0 - 1.0 mg/dL   Nitrite NEGATIVE NEGATIVE   Leukocytes, UA NEGATIVE NEGATIVE    Comment: MICROSCOPIC NOT DONE ON URINES WITH NEGATIVE PROTEIN, BLOOD, LEUKOCYTES, NITRITE, OR GLUCOSE <1000 mg/dL.  Urine culture     Status: None   Collection Time: 10/08/14  8:36 PM  Result Value Ref Range   Specimen Description URINE, CLEAN CATCH    Special Requests levaquin     Colony Count NO GROWTH Performed at [facility]     Culture NO GROWTH Performed at [facility]     Report Status 10/10/2014 FINAL   Clostridium Difficile by PCR     Status: None   Collection Time: 10/09/14  4:26 AM  Result Value Ref Range   C difficile by pcr NEGATIVE NEGATIVE  CBC WITH DIFFERENTIAL     Status: Abnormal   Collection Time: 10/09/14  4:55 AM  Result Value Ref Range   WBC 6.8 4.0 - 10.5 K/uL   RBC 3.13 (L) 3.87 - 5.11 MIL/uL   Hemoglobin 9.7 (L) 12.0 - 15.0 g/dL   HCT 28.8 (L) 36.0 - 46.0 %   MCV 92.0 78.0 - 100.0 fL   MCH 31.0 26.0 - 34.0 pg   MCHC 33.7 30.0 - 36.0 g/dL   RDW 14.8 11.5 - 15.5 %   Platelets 326 150 - 400 K/uL    Comment: REPEATED TO VERIFY   Neutrophils Relative % 71 43 - 77 %   Neutro Abs 4.8 1.7 - 7.7 K/uL   Lymphocytes Relative 17 12 - 46 %   Lymphs Abs 1.2 0.7 - 4.0 K/uL   Monocytes Relative 8 3 - 12 %   Monocytes Absolute 0.5 0.1 - 1.0 K/uL   Eosinophils Relative 3 0 - 5 %   Eosinophils Absolute 0.2 0.0 - 0.7 K/uL   Basophils Relative 0 0 - 1 %  Basophils Absolute 0.0 0.0 - 0.1 K/uL  Comprehensive metabolic panel     Status: Abnormal   Collection Time: 10/09/14  4:55 AM  Result Value Ref Range   Sodium 142 135 - 145 mmol/L   Potassium 3.6 3.5 - 5.1 mmol/L   Chloride 105 96 - 112 mmol/L   CO2 27 19 - 32 mmol/L   Glucose, Bld 114 (H) 70 - 99 mg/dL   BUN <5 (L) 6 - 23 mg/dL   Creatinine, Ser 0.70 0.50 - 1.10 mg/dL   Calcium 8.5 8.4 - 10.5 mg/dL   Total Protein 5.3 (L) 6.0 - 8.3 g/dL   Albumin 2.0 (L) 3.5 - 5.2 g/dL   AST 33 0 - 37 U/L   ALT 63 (H) 0 - 35 U/L   Alkaline Phosphatase 60 39 - 117 U/L   Total Bilirubin 0.5 0.3 - 1.2 mg/dL   GFR calc non Af Amer >90 >90 mL/min   GFR calc Af Amer >90 >90 mL/min    Comment: (NOTE) The eGFR has been calculated using the CKD EPI equation. This calculation has not been validated in all clinical situations. eGFR's persistently <90 mL/min signify possible Chronic  Kidney Disease.    Anion gap 10 5 - 15  CK     Status: Abnormal   Collection Time: 10/09/14  4:55 AM  Result Value Ref Range   Total CK 514 (H) 7 - 177 U/L     HEENT: normal Cardio: RRR and no murmur Resp: CTA B/L and unlabored GI: BS positive and NT, ND Extremity:  No Edema Skin:   Wound Dorsum of wrist wound / blisters bandaged Neuro: Alert/Oriented, Anxious, Abnormal Sensory entire hand and foot  On left with numbess, L thumb actually spared and Abnormal Motor 3- L wrist  And finger ext and finger flexors, Left foot 0/5 ankle DF toe ext, trace PF Musc/Skel:  Swelling Left foot and improved Left wrist Gen NAD but c/o pain with movement   Assessment/Plan: 1. Functional deficits secondary to multiple compression neuropathies and rhabdomyalysis which require 3+ hours per day of interdisciplinary therapy in a comprehensive inpatient rehab setting. Physiatrist is providing close team supervision and 24 hour management of active medical problems listed below. Physiatrist and rehab team continue to assess barriers to discharge/monitor patient progress toward functional and medical goals. Suspect causalgia LLE, add medrol burst and taper Poor coping , low frustration level, likely premorbid given substance abuse hx FIM: FIM - Bathing Bathing Steps Patient Completed: Chest, Right Arm, Left Arm, Abdomen, Front perineal area, Buttocks, Right upper leg, Left upper leg, Right lower leg (including foot) Bathing: 4: Min-Patient completes 8-9 4f10 parts or 75+ percent  FIM - Upper Body Dressing/Undressing Upper body dressing/undressing: 0: Wears gown/pajamas-no public clothing FIM - Lower Body Dressing/Undressing Lower body dressing/undressing: 0: Wears gInterior and spatial designer    FIM - TRadio producerDevices: BRecruitment consultantTransfers: 4-To toilet/BSC: Min A (steadying Pt. > 75%)  FIM - Bed/Chair Transfer Bed/Chair Transfer Assistive  Devices: Bed rails, HOB elevated Bed/Chair Transfer: 4: Supine > Sit: Min A (steadying Pt. > 75%/lift 1 leg), 4: Sit > Supine: Min A (steadying pt. > 75%/lift 1 leg), 4: Bed > Chair or W/C: Min A (steadying Pt. > 75%), 4: Chair or W/C > Bed: Min A (steadying Pt. > 75%)  FIM - Locomotion: Wheelchair Locomotion: Wheelchair: 0: Activity did not occur FIM - Locomotion: Ambulation Locomotion: Ambulation Assistive Devices: WEngineer, agriculturalAmbulation/Gait Assistance:  4: Min assist (for 3') Locomotion: Ambulation: 1: Travels less than 50 ft with minimal assistance (Pt.>75%)  Comprehension Comprehension Mode: Auditory Comprehension: 6-Follows complex conversation/direction: With extra time/assistive device  Expression Expression Mode: Verbal Expression: 6-Expresses complex ideas: With extra time/assistive device  Social Interaction Social Interaction: 6-Interacts appropriately with others with medication or extra time (anti-anxiety, antidepressant).  Problem Solving Problem Solving: 5-Solves complex 90% of the time/cues < 10% of the time  Memory Memory: 7-Complete Independence: No helper  Medical Problem List and Plan: 1. 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 DVT Prophylaxis/Anticoagulation: Pharmaceutical: Lovenox 3. Pain Management: Started on neurontin and cymbalta yesterday--titrate upward for symptom management.Add medrol for causalgia Continue oxycodone prn for now.  4. Anxiety/Mood: team to provide ego support and encouragement. LCSW to follow for evaluation and support. Will continue low dose xanax for now.  5. Neuropsych: This patient is capable of making decisions on her own behalf. 6. Skin/Wound Care: silvadene to foot wound. Continue pressure relief measures.  7. Fluids/Electrolytes/Nutrition: Monitor  I/O. Offer supplements between meals. Check lytes in am.  8. Polysubstance abuse: Need to limit narcotic due to prior history. Attempted to start drug rehab program 06/2013? 9. Sepsis with Aspiration PNA/ UTI: Encourage IS. Antibiotics changed to levaquin today. Progressive consolidation--question fluid overload. Will check 2 view CXR 10. Rhabdomyolysis: Recheck lytes and CK .  11. ABLA: Check follow up labs in am.  LOS (Days) 3 A FACE TO FACE EVALUATION WAS PERFORMED  Charlotte Strong E 10/11/2014, 6:58 AM

## 2014-10-12 ENCOUNTER — Inpatient Hospital Stay (HOSPITAL_COMMUNITY): Payer: Medicaid Other

## 2014-10-12 ENCOUNTER — Inpatient Hospital Stay (HOSPITAL_COMMUNITY): Payer: Self-pay

## 2014-10-12 ENCOUNTER — Inpatient Hospital Stay (HOSPITAL_COMMUNITY): Payer: Medicaid Other | Admitting: Occupational Therapy

## 2014-10-12 ENCOUNTER — Encounter (HOSPITAL_COMMUNITY): Payer: Self-pay

## 2014-10-12 ENCOUNTER — Inpatient Hospital Stay: Payer: Self-pay | Admitting: Family Medicine

## 2014-10-12 ENCOUNTER — Inpatient Hospital Stay (HOSPITAL_COMMUNITY): Payer: Self-pay | Admitting: Rehabilitation

## 2014-10-12 DIAGNOSIS — M6282 Rhabdomyolysis: Secondary | ICD-10-CM

## 2014-10-12 DIAGNOSIS — T79A29S Traumatic compartment syndrome of unspecified lower extremity, sequela: Secondary | ICD-10-CM

## 2014-10-12 DIAGNOSIS — T79A29A Traumatic compartment syndrome of unspecified lower extremity, initial encounter: Secondary | ICD-10-CM

## 2014-10-12 MED ORDER — OXYCODONE HCL 5 MG PO TABS
5.0000 mg | ORAL_TABLET | ORAL | Status: DC | PRN
  Administered 2014-10-12 – 2014-10-14 (×7): 5 mg via ORAL
  Filled 2014-10-12 (×7): qty 1

## 2014-10-12 NOTE — Progress Notes (Signed)
Physical Therapy Session Note  Patient Details  Name: Charlotte Strong MRN: [id number] Date of Birth: 01-23-1976  Today's Date: 10/12/2014 PT Individual Time: 1100-1200 PT Individual Time Calculation (min): 60 min   Short Term Goals: Week 1:  PT Short Term Goal 1 (Week 1): STG's = LTG's due to ELOS  Skilled Therapeutic Interventions/Progress Updates:   Pt received sitting in recliner in room, agreeable to therapy session.  Note that pt stating marked improvement in pain today vs weekend due to being on steroids and states that she will be on these for two months upon going home.  Pt ambulated to therapy gym x 120' with use of L PFRW at S level.  Note L steppage gait pattern, however this was focus on first part of session.  Once in therapy gym, provided pt with loaner shoes from unit with use of foot up brace and again assessed ambulation with and without RW x 90'  X 2 reps at min A level (facilitation for upright posture and weight shift)  with single instance of mod A due to LOB from foot drop.  Continued to note steppage gait pattern, therefore attempted use of walk on brace, however would not fit in shoe therefore donned ace wrap in order to simulate brace.  Pt able to ambulate at mod I level with RW and with min A without AD with continued improvement in gait pattern and even progressed to ambulating at S level with use of SPC and ace wrap on LLE.  Also removed platform from RW and assessed gait.  She was able to continue to ambulate with ace wrap/shoe with RW at S to mod I level.  Discussed D/C home towards end of week with outing on Wednesday to assess community integration with safety and concerns that may exist.  Pt verbalized agreement and willingness to participate in outing.  Also discussed safety in the room and the need to continue to use RW in room.  Performed seated nustep x 6 mins at level 5-6 resistance with BLEs only to increase L quad/glute strength and overall activity tolerance and  endurance.  Pt tolerated well with no increase in pain.  Ended session with stair negotiation to simulate home entry.  Performed 5, 4-6" steps with single rail at S level in step to fashion to increase safety due to weakness in LLE, esp at knee.  Pt tolerated well and is very pleased with progress.  Pt ambulated back to room with RW and left in recliner with all needs in reach.   Therapy Documentation Precautions:  Precautions Precautions: Fall Precaution Comments: Picc line R arm, splint L UE, low threshhold for pain and frustration, impulsive Required Braces or Orthoses: Other Brace/Splint Other Brace/Splint: Splint L UE resting hand Restrictions Weight Bearing Restrictions: No   Pain: Pain Assessment Pain Assessment: 0-10 Pain Score: 5  Pain Type: Acute pain Pain Location: Leg Pain Orientation: Left Pain Descriptors / Indicators: Aching Patients Stated Pain Goal: 4 Pain Intervention(s): Medication (See eMAR)  See FIM for current functional status  Therapy/Group: Individual Therapy  Vista Deckarcell, [name] 10/12/2014, 11:28 AM
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
## 2014-10-12 NOTE — Progress Notes (Signed)
Physical Therapy Session Note  Patient Details  Name: Charlotte Strong MRN: [id number] Date of Birth: [birth year]  Today's Date: 10/12/2014 PT Individual Time: 1315-1345 PT Individual Time Calculation (min): 30 min   Short Term Goals: Week 1:  PT Short Term Goal 1 (Week 1): STG's = LTG's due to ELOS  Skilled Therapeutic Interventions/Progress Updates:    Pt received supine in bed, agreeable to participate in therapy. Session focused on functional endurance, community ambulation, dynamic gait assessment. Pt ambulated to hospital lobby and outside on uneven sidewalk with supervision w/ intermittent need for MinGuardA due to foot drag. On flat level surface pt utilized steppage pattern to compensate for foot drop, however on uneven sidewalk pt dragged foot intermittently even with steppage pattern. Pt able to ambulate >300' including on/off elevators without need for rest break. Administered dynamic gait index to pt w/ pt scoring 12/24. Pt educated on falls risk interpretation of test with pt agreeing with interpretation. Pt left supine in bed w/ case worker present and all needs within reach.   Therapy Documentation Precautions:  Precautions Precautions: Fall Precaution Comments: Picc line R arm, splint L UE, low threshhold for pain and frustration, impulsive Required Braces or Orthoses: Other Brace/Splint Other Brace/Splint: Splint L UE resting hand Restrictions Weight Bearing Restrictions: No Pain: Pain Assessment Pain Assessment: 0-10 Pain Score: 7  Pain Type: Acute pain Pain Location: Leg Pain Orientation: Left Pain Descriptors / Indicators: Aching Patients Stated Pain Goal: 4 Pain Intervention(s): Pt received Medication prior to treatment  See FIM for current functional status  Therapy/Group: Individual Therapy  Hosie SpangleGodfrey, Shakira Los  Hosie SpangleJess Shanee Batch, PT, DPT 10/12/2014, 7:31 AM

## 2014-10-12 NOTE — Progress Notes (Signed)
Occupational Therapy Session Note  Patient Details  Name: Charlotte GailsCrystal Strong MRN: [id number] Date of Birth: [birth year]  Today's Date: 10/12/2014 OT Individual Time: 0900-1000 OT Individual Time Calculation (min): 60 min    Short Term Goals: Week 1:  OT Short Term Goal 1 (Week 1): STG= LTGs secondary to short LOS  Skilled Therapeutic Interventions/Progress Updates:    Pt seen for ADL retraining with focus on functional mobility, standing balance, and gross/fine motor coordination. Pt received supine in bed. Ambulated bed>bathroom at SBA level using PFRW and min cues for safety. Pt completed bathing at shower level at supervision with towel roll under L hip and use of basin for supporting L foot to increase comfort. Pt ambulated back to bed and completed dressing at supervision. Pt completed oral care in standing with supervision for balance and tolerating standing 45 seconds before requiring rest break. Engaged in fine and gross motor coordination activities from w/c level in gym. Completed 9-hole peg test with pt completing in RUE in 30 seconds and LUE 1:43. Pt utilized LUE to place clothes pins on vertical and horizontal surface. Pt demonstrated improved coordination using three jaw chuck grasp. Completed medium size peg activity with pt requiring increased time using pincer grasp, however able to complete. Pt returned to room and left sitting in recliner chair with all needs in reach. Encouraged pt to continue practicing with theraputty in room to improve [facility].   Therapy Documentation Precautions:  Precautions Precautions: Fall Precaution Comments: Picc line R arm, splint L UE, low threshhold for pain and frustration, impulsive Required Braces or Orthoses: Other Brace/Splint Other Brace/Splint: Splint L UE resting hand Restrictions Weight Bearing Restrictions: No General:   Vital Signs:   Pain: Pain Assessment Pain Assessment: 0-10 Pain Score: 5  Pain Type: Acute pain Pain Location:  Leg Pain Orientation: Left Pain Descriptors / Indicators: Aching Patients Stated Pain Goal: 4 Pain Intervention(s): Medication (See eMAR)  See FIM for current functional status  Therapy/Group: Individual Therapy  Daneil Danerkinson, Reva Pinkley N 10/12/2014, 12:32 PM

## 2014-10-12 NOTE — Progress Notes (Signed)
Subjective/Complaints:  Foot and wrist pain improved  No issues overnite , looking forward to D/C    Review of Systems - Negative except cough and Left ankle pain  Objective: Vital Signs: Blood pressure 127/74, pulse 86, temperature 98.7 F (37.1 C), temperature source Oral, resp. rate 18, SpO2 97 %. No results found. No results found for this or any previous visit (from the past 72 hour(s)).   HEENT: normal Cardio: RRR and no murmur Resp: CTA B/L and unlabored GI: BS positive and NT, ND Extremity:  No Edema Skin:   Wound Dorsum of wrist wound / blisters bandaged Neuro: Alert/Oriented, Anxious, Abnormal Sensory entire hand and foot  On left with numbess, L thumb actually spared and Abnormal Motor 3- L wrist  And finger ext and finger flexors, Left foot 0/5 ankle DF toe ext, trace PF Musc/Skel:  Swelling Left foot and improved Left wrist Gen NAD but c/o pain with movement   Assessment/Plan: 1. Functional deficits secondary to multiple compression neuropathies and rhabdomyalysis which require 3+ hours per day of interdisciplinary therapy in a comprehensive inpatient rehab setting. Physiatrist is providing close team supervision and 24 hour management of active medical problems listed below. Physiatrist and rehab team continue to assess barriers to discharge/monitor patient progress toward functional and medical goals. Suspect causalgia LLE, Improving with  medrol burst and taper FIM: FIM - Bathing Bathing Steps Patient Completed: Chest, Right Arm, Left Arm, Abdomen, Front perineal area, Buttocks, Right upper leg, Left upper leg, Right lower leg (including foot) Bathing: 4: Min-Patient completes 8-9 [id number] 10 parts or 75+ percent  FIM - Upper Body Dressing/Undressing Upper body dressing/undressing: 0: Wears gown/pajamas-no public clothing FIM - Lower Body Dressing/Undressing Lower body dressing/undressing: 0: Wears Oceanographergown/pajamas-no public clothing     FIM - Ambulance personToilet  Transfers Toilet Transfers Assistive Devices: PsychiatristBedside commode Toilet Transfers: 4-To toilet/BSC: Min A (steadying Pt. > 75%)  FIM - Bed/Chair Transfer Bed/Chair Transfer Assistive Devices: Bed rails, HOB elevated Bed/Chair Transfer: 4: Supine > Sit: Min A (steadying Pt. > 75%/lift 1 leg), 4: Sit > Supine: Min A (steadying pt. > 75%/lift 1 leg), 4: Bed > Chair or W/C: Min A (steadying Pt. > 75%), 4: Chair or W/C > Bed: Min A (steadying Pt. > 75%)  FIM - Locomotion: Wheelchair Locomotion: Wheelchair: 0: Activity did not occur FIM - Locomotion: Ambulation Locomotion: Ambulation Assistive Devices: TEFL teacherWalker - Platform Ambulation/Gait Assistance: 4: Min assist (for 10') Locomotion: Ambulation: 1: Travels less than 50 ft with minimal assistance (Pt.>75%)  Comprehension Comprehension Mode: Auditory Comprehension: 6-Follows complex conversation/direction: With extra time/assistive device  Expression Expression Mode: Verbal Expression: 6-Expresses complex ideas: With extra time/assistive device  Social Interaction Social Interaction: 3-Interacts appropriately 50 - 74% of the time - May be physically or verbally inappropriate.  Problem Solving Problem Solving: 5-Solves complex 90% of the time/cues < 10% of the time  Memory Memory: 7-Complete Independence: No helper  Medical Problem List and Plan: 1. 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 DVT Prophylaxis/Anticoagulation: Pharmaceutical: Lovenox 3. Pain Management: Started on neurontin and cymbalta yesterday--titrate upward for symptom management.Add medrol for causalgia Continue oxycodone prn for now.  4. Anxiety/Mood: team to provide ego support and encouragement. LCSW to follow for evaluation and support. Will continue low dose xanax for now.  5. Neuropsych: This  patient is capable of making decisions on her own behalf. 6. Skin/Wound Care:  silvadene to foot wound. Continue pressure relief measures.  7. Fluids/Electrolytes/Nutrition: Monitor I/O. Offer supplements between meals. 8. Polysubstance abuse: Need to limit narcotic due to prior history. Attempted to start drug rehab program 06/2013? 9. Sepsis with Aspiration PNA/ UTI: Encourage IS. Antibiotics changed to levaquinRecheck CXR at endo of week   10. Rhabdomyolysis: Recheck lytes and CK .  11. ABLA: Check follow up labs in am.  LOS (Days) 4 A FACE TO FACE EVALUATION WAS PERFORMED  KIRSTEINS,ANDREW E 10/12/2014, 6:56 AM

## 2014-10-12 NOTE — Progress Notes (Signed)
Occupational Therapy Session Note  Patient Details  Name: Charlotte Strong MRN: [id number] Date of Birth: 1975/07/20  Today's Date: 10/12/2014 OT Individual Time: 1405-1505 OT Individual Time Calculation (min): 60 min    Short Term Goals: Week 1:  OT Short Term Goal 1 (Week 1): STG= LTGs secondary to short LOS  Skilled Therapeutic Interventions/Progress Updates:    Skilled OT session focusing on [facility] and dynamic balance in sitting and standing. Pt received in room in bed. Used RW with close supervision to ambulate to therapy gym. Pt performed 5 times sit to stand test. See result below. [facility] therapist facilitated activities involving use of LUE to maintain pinch grasp and reach outside BOS in sitting and standing. Pt reported "strain" but no pain in L wrist during waist level reaching in standing. Pt manipulated a variety of household bottle tops to open/close challenging fine motor grasps and bimanual task completion. In standing, pt engaged in 1 trial up and down finger ladder using LUE reporting pointer and middle finger to be the most challenging. Therapist facilitated LUE reaching for an item at various heights. Pt required rest breaks after aprox 1 min of standing secondary to left muscle spasm behind knee. Therapist provided stretch and brief massage of affected area. Pt reported decreased spasm in standing for next activity. Using RW, pt ambulated back to room and into bathroom with supervision for safety with walking.    Five times Sit to Stand Test (FTSS) Method: Use a straight back chair with a solid seat that is 16-18" high. Ask participant to sit on the chair with arms folded across their chest.  Instructions: "Stand up and sit down as quickly as possible 5 times, keeping your arms folded across your chest."  Measurement: Stop timing when the participant stands the 5th time.  TIME: _14.44_____ (in seconds)  Times > 13.6 seconds is associated with increased disability and  morbidity (Guralnik, 2000) Times > 15 seconds is predictive of recurrent falls in healthy individuals aged 365 and older (Buatois, et al., 2008) Normal performance values in community dwelling individuals aged 37 and older (Bohannon, 2006):  60-69 years: 11.4 seconds  70-79 years: 12.6 seconds  80-89 years: 14.8 seconds  MCID: ? 2.3 seconds for Vestibular Disorders Wray Kearns(Meretta, 2006)     Therapy Documentation Precautions:  Precautions Precautions: Fall Precaution Comments: Picc line R arm, splint L UE, low threshhold for pain and frustration, impulsive Required Braces or Orthoses: Other Brace/Splint Other Brace/Splint: Splint L UE resting hand Restrictions Weight Bearing Restrictions: No    Pain: Pain Assessment Pain Assessment: 0-10 Pain Score: 7  Pain Type: Acute pain Pain Location: Leg Pain Orientation: Left Pain Descriptors / Indicators: Aching Patients Stated Pain Goal: 4 Pain Intervention(s): Medication (See eMAR)  See FIM for current functional status  Therapy/Group: Individual Therapy  Newell Frater 10/12/2014, 3:10 PM

## 2014-10-13 ENCOUNTER — Inpatient Hospital Stay (HOSPITAL_COMMUNITY): Payer: Self-pay | Admitting: Rehabilitation

## 2014-10-13 ENCOUNTER — Inpatient Hospital Stay (HOSPITAL_COMMUNITY): Payer: Self-pay

## 2014-10-13 DIAGNOSIS — T391X2A Poisoning by 4-Aminophenol derivatives, intentional self-harm, initial encounter: Secondary | ICD-10-CM

## 2014-10-13 DIAGNOSIS — F1994 Other psychoactive substance use, unspecified with psychoactive substance-induced mood disorder: Secondary | ICD-10-CM | POA: Clinically undetermined

## 2014-10-13 DIAGNOSIS — F1310 Sedative, hypnotic or anxiolytic abuse, uncomplicated: Secondary | ICD-10-CM | POA: Diagnosis present

## 2014-10-13 DIAGNOSIS — T796XXA Traumatic ischemia of muscle, initial encounter: Secondary | ICD-10-CM

## 2014-10-13 DIAGNOSIS — T424X2A Poisoning by benzodiazepines, intentional self-harm, initial encounter: Secondary | ICD-10-CM

## 2014-10-13 DIAGNOSIS — T1491 Suicide attempt: Secondary | ICD-10-CM

## 2014-10-13 LAB — CBC
HEMATOCRIT: 36.8 % (ref 36.0–46.0)
Hemoglobin: 12.4 g/dL (ref 12.0–15.0)
MCH: 31.1 pg (ref 26.0–34.0)
MCHC: 33.7 g/dL (ref 30.0–36.0)
MCV: 92.2 fL (ref 78.0–100.0)
PLATELETS: 633 10*3/uL — AB (ref 150–400)
RBC: 3.99 MIL/uL (ref 3.87–5.11)
RDW: 14.6 % (ref 11.5–15.5)
WBC: 8.5 10*3/uL (ref 4.0–10.5)

## 2014-10-13 LAB — BASIC METABOLIC PANEL
Anion gap: 11 (ref 5–15)
BUN: 14 mg/dL (ref 6–20)
CO2: 28 mmol/L (ref 22–32)
CREATININE: 0.84 mg/dL (ref 0.44–1.00)
Calcium: 9 mg/dL (ref 8.9–10.3)
Chloride: 98 mmol/L — ABNORMAL LOW (ref 101–111)
GFR calc Af Amer: >60 mL/min (ref >60)
GFR calc non Af Amer: >60 mL/min (ref >60)
Glucose, Bld: 98 mg/dL (ref 70–99)
POTASSIUM: 3.8 mmol/L (ref 3.5–5.1)
SODIUM: 137 mmol/L (ref 135–145)

## 2014-10-13 LAB — CK: CK TOTAL: 219 U/L (ref 38–234)

## 2014-10-13 MED ORDER — GABAPENTIN 300 MG PO CAPS
300.0000 mg | ORAL_CAPSULE | Freq: Three times a day (TID) | ORAL | Status: DC
  Administered 2014-10-13 – 2014-10-15 (×7): 300 mg via ORAL
  Filled 2014-10-13 (×10): qty 1

## 2014-10-13 NOTE — Progress Notes (Signed)
Orthopedic Tech Progress Note Patient Details:  Charlotte GailsCrystal Strong 01/23/1976 [id number] Advanced called with brace order Ortho Devices Type of Ortho Device: Velcro wrist splint Ortho Device/Splint Location: LUE Ortho Device/Splint Interventions: Application   Asia R Thompson 10/13/2014, 12:06 PM

## 2014-10-13 NOTE — Consult Note (Signed)
  INITIAL DIAGNOSTIC EVALUATION - [facility]   MEDICAL NECESSITY:  [facility] for an initial diagnostic evaluation owing to the patient's diagnosis of compartment syndrome.   According to medical records, Charlotte Strong was admitted to the rehab unit owing to "Functional deficits secondary to drug overdose resulting in rhabdomyolysis and left lower ext. 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 female with history of drug abuse, asthma who was admitted on 4/20 by pulmonary critical care for drug overdose on benzodiazepines and opioids, found to have rhabdomyolysis and respiratory failure. Patient was found by family in the bathroom floor at midnight, unresponsive and agonal breathing. She was intubated in ED and found to have rhabdomyolysis with acute renal failure, lactic acidosis, CK 6610 with ATN."    During today's visit, Charlotte Strong denied suffering from any cognitive deficits post accidental overdose on Xanax. She said that she took 2-3 pills prior to showering and bedtime but passed out and hit her head. Family found her and purportedly did not call for medical assistance for several hours.   From an emotional standpoint, Charlotte Strong described her current mood as "great." She has a history of treatment for depression and anxiety via antidepressants that she did not find to be effective. She denied any recent bouts of depression or anxiety. She said that she is prescribed Xanax for sleep disturbance but never wants to take it again owing to what happened. In fact, she asked if this medication can be taken off her medicine list. Records indicate a history of polysubstance abuse. No adjustment issues endorsed. Suicidal/homicidal ideation, plan or intent was denied. No manic or hypomanic  episodes were reported. The patient denied ever experiencing any auditory/visual hallucinations. No major behavioral or personality changes were endorsed.   Charlotte Strong feels that she is making progress in therapy. No barriers to therapy identified. She described the rehab staff as "really good." She has ample support via her mother who she plans to live with upon discharge.   PROCEDURES ADMINISTERED: [1 unit 90791] Diagnostic clinical interview  Review of available records   SUMMARY & IMPRESSION: Charlotte Strong denied experiencing any negative cognitive or emotional symptoms. She feels that she is progressing well in therapy and has ample support via her family. At this time, no formal follow-up from neuropsychology appears warranted. I encouraged her to call upon us if she notes any decline in cognition or mood.      Debbe MountsAdam T. Luis Sami, Psy.D.  Clinical Neuropsychologist

## 2014-10-13 NOTE — Progress Notes (Signed)
Occupational Therapy Session Note  Patient Details  Name: Charlotte GailsCrystal Strong MRN: [id number] Date of Birth: Nov 09, 1975  Today's Date: 10/13/2014 OT Individual Time: 0800-0900 OT Individual Time Calculation (min): 60 min    Short Term Goals: Week 1:  OT Short Term Goal 1 (Week 1): STG= LTGs secondary to short LOS  Skilled Therapeutic Interventions/Progress Updates:    Pt declined bathing and dressing this morning and amb with RW for LUE therapeutic activities.  Pt initially engaged in grasping small pegs and placing/removing them from peg board.  Pt exhibited greater success with pegs wrapped in [place].  Pt educated on correct finger placement for grasp but exhibits difficulty with maintaining correct position when engaged in task.  Pt transitioned to grasping and placing clothes pins on metal dowel.  Focus on increased LUE strength and FMC to gain independence with BADLs and IADLs.  Therapy Documentation Precautions:  Precautions Precautions: Fall Precaution Comments: Picc line R arm, splint L UE, low threshhold for pain and frustration, impulsive Required Braces or Orthoses: Other Brace/Splint Other Brace/Splint: Splint L UE resting hand Restrictions Weight Bearing Restrictions: No General:   Vital Signs:   Pain: Pain Assessment Pain Assessment: 0-10 Pain Score: 2  Pain Type: Acute pain Pain Location: Leg Pain Orientation: Left Pain Descriptors / Indicators: Aching Pain Onset: On-going Pain Intervention(s): RN made aware;Repositioned  See FIM for current functional status  Therapy/Group: Individual Therapy  Rich BraveLanier, Merle Cirelli Chappell 10/13/2014, 9:03 AM
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
## 2014-10-13 NOTE — Progress Notes (Signed)
Subjective/Complaints:  Foot and ankle pain bothered her as well as left hip pain.  Left wrist pain improving  No issues overnite , looking forward to D/C Occ cough   Review of Systems - Negative except cough and Left ankle pain  Objective: Vital Signs: Blood pressure 118/85, pulse 87, temperature 98.7 F (37.1 C), temperature source Oral, resp. rate 20, SpO2 99 %. No results found. No results found for this or any previous visit (from the past 72 hour(s)).   HEENT: normal Cardio: RRR and no murmur Resp: CTA B/L and unlabored GI: BS positive and NT, ND Extremity:  No Edema Skin:   Wound Dorsum of wrist wound / blisters bandaged Neuro: Alert/Oriented, Anxious, Abnormal Sensory entire hand and foot  On left with numbess, L thumb actually spared and Abnormal Motor 3- L wrist  And finger ext and finger flexors, Left foot 0/5 ankle DF toe ext, trace PF Musc/Skel:  Swelling Left foot and improved Left wrist Gen NAD but c/o pain with movement   Assessment/Plan: 1. Functional deficits secondary to multiple compression neuropathies and rhabdomyalysis which require 3+ hours per day of interdisciplinary therapy in a comprehensive inpatient rehab setting. Physiatrist is providing close team supervision and 24 hour management of active medical problems listed below. Physiatrist and rehab team continue to assess barriers to discharge/monitor patient progress toward functional and medical goals. Adjusting pain meds, increase gabapentin, goal of no narcotic meds on D/C AFO pending FIM: FIM - Bathing Bathing Steps Patient Completed: Chest, Right Arm, Left Arm, Abdomen, Front perineal area, Buttocks, Right upper leg, Left upper leg, Right lower leg (including foot), Left lower leg (including foot) Bathing: 5: Supervision: Safety issues/verbal cues  FIM - Upper Body Dressing/Undressing Upper body dressing/undressing steps patient completed: Thread/unthread right sleeve of pullover  shirt/dresss, Thread/unthread left sleeve of pullover shirt/dress, Put head through opening of pull over shirt/dress, Pull shirt over trunk Upper body dressing/undressing: 5: Set-up assist to: Obtain clothing/put away FIM - Lower Body Dressing/Undressing Lower body dressing/undressing steps patient completed: Thread/unthread right underwear leg, Thread/unthread left underwear leg, Pull underwear up/down, Thread/unthread right pants leg, Thread/unthread left pants leg, Pull pants up/down, Don/Doff right sock, Don/Doff left sock Lower body dressing/undressing: 5: Set-up assist to: Obtain clothing  FIM - Toileting Toileting steps completed by patient: Adjust clothing prior to toileting, Performs perineal hygiene, Adjust clothing after toileting Toileting Assistive Devices: Grab bar or rail for support Toileting: 5: Supervision: Safety issues/verbal cues  FIM - Diplomatic Services operational officerToilet Transfers Toilet Transfers Assistive Devices: PsychiatristBedside commode Toilet Transfers: 4-To toilet/BSC: Min A (steadying Pt. > 75%)  FIM - Bed/Chair Transfer Bed/Chair Transfer Assistive Devices: Therapist, occupationalWalker Bed/Chair Transfer: 5: Supine > Sit: Supervision (verbal cues/safety issues), 5: Bed > Chair or W/C: Supervision (verbal cues/safety issues)  FIM - Locomotion: Wheelchair Locomotion: Wheelchair: 0: Activity did not occur FIM - Locomotion: Ambulation Locomotion: Ambulation Assistive Devices: Environmental consultantWalker - Platform, Emergency planning/management officerCane - Straight, Other (comment) (and without AD) Ambulation/Gait Assistance: 3: Mod assist, 4: Min assist, 5: Supervision Locomotion: Ambulation: 2: Travels 50 - 149 ft with moderate assistance (Pt: 50 - 74%)  Comprehension Comprehension Mode: Auditory Comprehension: 6-Follows complex conversation/direction: With extra time/assistive device  Expression Expression Mode: Verbal Expression: 6-Expresses complex ideas: With extra time/assistive device  Social Interaction Social Interaction: 3-Interacts appropriately 50 - 74% of  the time - May be physically or verbally inappropriate.  Problem Solving Problem Solving: 5-Solves complex 90% of the time/cues < 10% of the time  Memory Memory: 7-Complete Independence: No helper  Medical  Problem List and Plan: 1. 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 DVT Prophylaxis/Anticoagulation: Pharmaceutical: Lovenox 3. Pain Management: Started on neurontin and cymbalta yesterday--titrate upward for symptom management.Add medrol for causalgia Continue oxycodone prn for now.  4. Anxiety/Mood: team to provide ego support and encouragement. LCSW to follow for evaluation and support. Will continue low dose xanax for now.  5. Neuropsych: This patient is capable of making decisions on her own behalf. 6. Skin/Wound Care: silvadene to foot wound. Continue pressure relief measures.  7. Fluids/Electrolytes/Nutrition: Monitor I/O. Offer supplements between meals. 8. Polysubstance abuse: Need to limit narcotic due to prior history. Attempted to start drug rehab program 06/2013? 9. Sepsis with Aspiration PNA/ UTI: Encourage IS. Antibiotics changed to levaquinRecheck CXR at end of week   10. Rhabdomyolysis: Recheck lytes and CK .  11. ABLA: Check follow up labs  LOS (Days) 5 A FACE TO FACE EVALUATION WAS PERFORMED  KIRSTEINS,ANDREW E 10/13/2014, 7:08 AM

## 2014-10-13 NOTE — Progress Notes (Signed)
Occupational Therapy Note  Patient Details  Name: Charlotte GailsCrystal Strong MRN: [id number] Date of Birth: 03-29-1976  Today's Date: 10/13/2014 OT Individual Time: 1300-1400 OT Individual Time Calculation (min): 60 min   Pt c/o 4/10 pain in left hip; RN administered meds at beginning of session Individual Therapy  Pt amb with RW to therapy gym.  Pt engaged in LUE activities with focus on pincher grasp and 3 finger chuck grasp.  Pt exhibits difficulty maintaining thumb extension/control when grasping objects (smaller more difficulty than large).  Pt initially grasped cups using 3 finger chuck grip and progressed to picking up small objects.  Pt reports decreased sensation in fingers and was educated on importance of attending visually when picking up objects.  Pt issued theraputty with 10 beads and practiced retrieving beads from putty and returning them back into putty using left hand.  Pt reports fatigue in LUE with continuous use and required multiple rest breaks throughout session.  Pt returned to room and bed with all needs within reach.   Lavone NeriLanier, Lore Polka [facility] Hospital[facility]ell 10/13/2014, 2:06 PM

## 2014-10-13 NOTE — Progress Notes (Signed)
Physical Therapy Session Note  Patient Details  Name: Renato GailsCrystal Desmarais MRN: [id number] Date of Birth: 1976/04/30  Today's Date: 10/13/2014 PT Individual Time: [id number] PT Individual Time Calculation (min): 65 min   Short Term Goals: Week 1:  PT Short Term Goal 1 (Week 1): STG's = LTG's due to ELOS  Skilled Therapeutic Interventions/Progress Updates:   Pt received sitting in recliner, agreeable to therapy session.  Skilled session focused on dynamic gait and balance with community integration.  Pt able to ambulate >500' with RW in community setting, outdoors on uneven surfaces, over mulch to simulate more compliant surfaces, as well as navigation through gift shop to problem solve how to maneuver through tight spaces.  Pt able to perform at S level with min cues for posture, safety with RW and decreased gait speed at times for increased safety.  [facility] during later part of session to assess use of toe off AFO for LLE foot clearance.  Note marked improvement in L foot clearance, however did note mild knee hyperextension during larger L step lengths and with fatigue.  Will add shoe wedge if needed tomorrow when pt gets her shoes for improved fit and comfort, as well as continue to work on increased quad control.  Ended session with seated nustep x 6 mins at level 4 resistance with use of BLEs only for overall activity tolerance and LE strengthening.  Also provided pt with OTAGO HEP for overall strengthening and balance improvement.  Performed seated LAQ's x 10 reps with 5 sec hold, standing hip abd with tactile cues for technique, and standing mini squats with demonstration and verbal cues for technique.  Pt with increased fatigue and pain in LLE following activity, therefore missed last 10 mins of session.  Pt requesting to return to bed and remove shoes.  Performed bed mobility at mod I level.  Left in bed with all needs in reach.   Therapy Documentation Precautions:   Precautions Precautions: Fall Precaution Comments: Picc line R arm, splint L UE, low threshhold for pain and frustration, impulsive Required Braces or Orthoses: Other Brace/Splint Other Brace/Splint: Splint L UE resting hand Restrictions Weight Bearing Restrictions: No:   Pain: Pain Assessment Pain Assessment: 0-10 Pain Score: 2  Pain Type: Acute pain Pain Location: Leg Pain Orientation: Left Pain Descriptors / Indicators: Aching Pain Frequency: Intermittent Pain Onset: On-going Pain Intervention(s): Medication (See eMAR)    See FIM for current functional status  Therapy/Group: Individual Therapy  Vista Deckarcell, Zhavia Cunanan Ann 10/13/2014, 10:26 AM

## 2014-10-14 ENCOUNTER — Inpatient Hospital Stay (HOSPITAL_COMMUNITY): Payer: Medicaid Other

## 2014-10-14 ENCOUNTER — Encounter (HOSPITAL_COMMUNITY): Payer: Self-pay | Admitting: Radiology

## 2014-10-14 ENCOUNTER — Inpatient Hospital Stay (HOSPITAL_COMMUNITY): Payer: Medicaid Other | Admitting: Rehabilitation

## 2014-10-14 ENCOUNTER — Inpatient Hospital Stay (HOSPITAL_COMMUNITY): Payer: Self-pay | Admitting: Occupational Therapy

## 2014-10-14 DIAGNOSIS — F1310 Sedative, hypnotic or anxiolytic abuse, uncomplicated: Secondary | ICD-10-CM

## 2014-10-14 DIAGNOSIS — F1994 Other psychoactive substance use, unspecified with psychoactive substance-induced mood disorder: Secondary | ICD-10-CM

## 2014-10-14 DIAGNOSIS — S8412XS Injury of peroneal nerve at lower leg level, left leg, sequela: Secondary | ICD-10-CM

## 2014-10-14 MED ORDER — MUSCLE RUB 10-15 % EX CREA
1.0000 "application " | TOPICAL_CREAM | Freq: Three times a day (TID) | CUTANEOUS | Status: DC
  Administered 2014-10-14 – 2014-10-15 (×5): 1 via TOPICAL
  Filled 2014-10-14: qty 85

## 2014-10-14 MED ORDER — OXYCODONE HCL 5 MG PO TABS
5.0000 mg | ORAL_TABLET | Freq: Four times a day (QID) | ORAL | Status: DC | PRN
  Administered 2014-10-14 – 2014-10-15 (×3): 5 mg via ORAL
  Filled 2014-10-14 (×3): qty 1

## 2014-10-14 NOTE — Patient Care Conference (Signed)
Inpatient RehabilitationTeam Conference and Plan of Care Update Date: 10/14/2014   Time: 10;55 AM    Patient Name: Charlotte GailsCrystal Strong      Medical Record Number: [id number]  Date of Birth: [birth year] Sex: Female         Room/Bed: 4M03C/4M03C-01 Payor Info: Payor: /    Admitting Diagnosis: Deconditioned   Admit Date/Time:  10/08/2014  5:58 PM Admission Comments: No comment available   Primary Diagnosis:  Substance induced mood disorder Principal Problem: Substance induced mood disorder  Patient Active Problem List   Diagnosis Date Noted  . Substance induced mood disorder 10/13/2014  . 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 Compartment syndrome of lower extremity 10/08/2014  . Wheezing   . Neuropathic pain   . Depression with anxiety   . Palliative care encounter   . Pain   . Encephalopathy acute   . Aspiration pneumonia   . Altered mental status 09/30/2014  . Acute renal failure 09/30/2014  . Rhabdomyolysis 09/30/2014  . Acute respiratory failure with hypoxia 09/30/2014  . Lactic acidosis 09/30/2014    Expected Discharge Date: Expected Discharge Date: 10/15/14  Team Members Present: Physician leading conference: Dr. Claudette LawsAndrew Kirsteins Social Worker Present: Dossie DerBecky Zebulon Gantt, LCSW Nurse Present: Chana Bodeeborah Sharp, RN PT Present: Harriet ButteEmily Parcell, PT;Rodney Leo GrosserWishart, PT OT Present: Callie FieldingKatie Pittman, OT SLP Present: Jackalyn LombardNicole Page, SLP PPS Coordinator present : Tora DuckMarie Noel, RN, CRRN     Current Status/Progress Goal Weekly Team Focus  Medical   Left wrist drop improving, Left foot drop persistent  home with appropriate orthotics and equip  d/c plans   Bowel/Bladder     cont B & B        Swallow/Nutrition/ Hydration     na        ADL's   Pt currently S to Mod I overall  Mod I overall   D/C planning, pt/family education   Mobility   Pt currently S to mod I overall  mod I overall  D/C planning, pt/family education   Communication     na        Safety/Cognition/ Behavioral  Observations    no unsafe behaviors        Pain     managing pain with least medicines   pain managed     Skin     no skin issues           *See Care Plan and progress notes for long and short-term goals.  Barriers to Discharge: wean off narcotic meds    Possible Resolutions to Barriers:  see above home off xanax and percocet    Discharge Planning/Teaching Needs:  Home with Mom who works part time, so pt eneds to be mod/i before going home. Mom's boyfriend but pt doesn't want his help      Team Discussion:  Pt reaching mod/i goals-will make her mod/i in room today. Steroid taper.  AFO helping with foot drop, wrist drop improving.  Medically ready for dc tomorrow. Pt wants to wean off pain medicines  Revisions to Treatment Plan:  None   Continued Need for Acute Rehabilitation Level of Care: The patient requires daily medical management by a physician with specialized training in physical medicine and rehabilitation for the following conditions: Daily direction of a multidisciplinary physical rehabilitation program to ensure safe treatment while eliciting the highest outcome that is of practical value to the patient.: Yes Daily medical management of patient stability for increased activity during participation in an intensive rehabilitation regime.:  Yes Daily analysis of laboratory values and/or radiology reports with any subsequent need for medication adjustment of medical intervention for : Neurological problems;Other  Meriam Chojnowski, Lemar LivingsRebecca G 10/15/2014, 9:17 AM

## 2014-10-14 NOTE — Plan of Care (Signed)
Problem: RH Wheelchair Mobility Goal: LTG Patient will propel w/c in controlled environment (PT) LTG: Patient will propel wheelchair in controlled environment, # of feet with assist (PT)  Outcome: Not Applicable Date Met:  89/48/34 Pt ambulatory at D/C.  Goal: LTG Patient will propel w/c in home environment (PT) LTG: Patient will propel wheelchair in home environment, # of feet with assistance (PT).  Outcome: Not Applicable Date Met:  75/83/07 Pt ambulatory at D/C.

## 2014-10-14 NOTE — Plan of Care (Signed)
Problem: RH Simple Meal Prep Goal: LTG Patient will perform simple meal prep w/assist (OT) LTG: Patient will perform simple meal prep with assistance, with/without cues (OT).  Outcome: Not Applicable Date Met:  26/27/00 Pt did not wish to address this goal during therapy sessions. D/C as she reports, "I am going to stay with my mom and she already said I can't cook when I get there."  Problem: RH Laundry Goal: LTG Patient will perform laundry w/assist, cues (OT) LTG: Patient will perform laundry with assistance, with/without cues (OT).  Outcome: Not Applicable Date Met:  48/49/86 Not addressed as pt stating, "My mom is not going to let me do that." Pt wishing to not address laundry goals in rehab.

## 2014-10-14 NOTE — Progress Notes (Addendum)
Physical Therapy Discharge Summary  Patient Details  Name: Charlotte Strong MRN: [id number] Date of Birth: 21-Jul-1975  Today's Date: 10/14/2014 PT Individual Time: 1100-1110 PT Individual Time Calculation (min): 10 min  and Today's Date: 10/14/2014 PT Concurrent Time: 0930-1100 PT Concurrent Time Calculation (min): 90 min   Patient has met 7 of 7 long term goals due to improved activity tolerance, improved balance, increased strength, decreased pain and ability to compensate for deficits.  Patient to discharge at an ambulatory level Modified Independent.   Patient's care partner did not take part in formal family training due to pt is mod I but will have intermittent assist at home from mother and boyfriend.   Reasons goals not met: n/a  Recommendation:  Patient will benefit from ongoing skilled PT services in outpatient setting to continue to advance safe functional mobility, address ongoing impairments in balance, strength deficits, endurance, and minimize fall risk.  Equipment: [facility]  Reasons for discharge: treatment goals met and discharge from hospital  Patient/family agrees with progress made and goals achieved: Yes   PT Treatment/Intervention: Skilled session focused on outing for community integration with RT to focus on dynamic balance, gait with use of RW and shopping cart in community setting, negotiation around tight spaces, gait over uneven surfaces and negotiation up/down curb.  Pt performed all gait at mod I level during session.  Discussed safety and concerns of community integration with good problem solving on how to better improve going in community.  Also performed public restroom negotiation and transfer with RW at mod I level. See goal sheet for further details.  Ended session with assessment of goals and strength, sensation, etc.  See below for further details.  Performed all mobility at mod I level with S for stairs for safety and car transfer due to assist needed for  RW/cane.  Had pt ambulate in hallway without use of AD at S level.  Recommend use of SPC for home (will order for D/C) to increase safety, however do not feel she needs RW at this time.  Pt left in room with all needs, made mod I in room, RN made aware.   PT Discharge Precautions/Restrictions Precautions Precautions: Fall Precaution Comments: L foot drop w/ AFO Required Braces or Orthoses: Other Brace/Splint Other Brace/Splint: Splint L UE resting hand Restrictions Weight Bearing Restrictions: No Pain Pain Assessment Pain Assessment: 0-10 Pain Score: 3  Pain Type: Acute pain Pain Location: Knee Pain Orientation: Left Pain Descriptors / Indicators: Aching Pain Intervention(s): Repositioned;Rest    Cognition Overall Cognitive Status: Within Functional Limits for tasks assessed Arousal/Alertness: Awake/alert Orientation Level: Oriented X4 Attention: Divided Divided Attention: Appears intact Memory: Appears intact Awareness: Appears intact Safety/Judgment: Appears intact Comments: Pt with no safety concerns and is Mod I in room.  Sensation Sensation Light Touch: Appears Intact Stereognosis: Not tested Hot/Cold: Not tested Proprioception: Appears Intact Coordination Gross Motor Movements are Fluid and Coordinated: No Fine Motor Movements are Fluid and Coordinated: No Coordination and Movement Description: Pt with decreased fine motor in LUE and at ankle in LLE Heel Shin Test: [facility] Motor  Motor Motor: Other (comment) Motor - Discharge Observations: L radial and peroneal nerve palsy, decreased balance  Mobility Bed Mobility Bed Mobility: Supine to Sit;Sit to Supine Supine to Sit: 7: Independent Sit to Supine: 7: Independent Transfers Transfers: Yes Sit to Stand: 6: Modified independent (Device/Increase time) Stand to Sit: 6: Modified independent (Device/Increase time) Stand Pivot Transfers: 6: Modified independent (Device/Increase time) Locomotion   Ambulation Ambulation: Yes Ambulation/Gait  Assistance: 6: Modified independent (Device/Increase time) Ambulation Distance (Feet): 400 Feet Assistive device: Rolling walker Gait Gait: Yes Gait Pattern: Impaired Gait Pattern: Antalgic Stairs / Additional Locomotion Stairs: Yes Stairs Assistance: 5: Supervision Stairs Assistance Details: Verbal cues for sequencing;Verbal cues for technique;Verbal cues for precautions/safety Stair Management Technique: One rail Right;Step to pattern;Forwards Number of Stairs: 12 Height of Stairs: 6 Wheelchair Mobility Wheelchair Mobility: No Distance:  (pt ambulatory)  Trunk/Postural Assessment  Cervical Assessment Cervical Assessment: Within Functional Limits Thoracic Assessment Thoracic Assessment: Within Functional Limits Lumbar Assessment Lumbar Assessment: Within Functional Limits Postural Control Postural Control: Within Functional Limits  Balance Balance Balance Assessed: Yes Dynamic Sitting Balance Dynamic Sitting - Balance Support: Feet supported Dynamic Sitting - Level of Assistance: 6: Modified independent (Device/Increase time) Static Standing Balance Static Standing - Balance Support: During functional activity Static Standing - Level of Assistance: 6: Modified independent (Device/Increase time) Extremity Assessment      RLE Assessment RLE Assessment: Within Functional Limits LLE Assessment LLE Assessment: Exceptions to [facility] LLE Strength LLE Overall Strength: Deficits LLE Overall Strength Comments: knee flex 3+/5 due to pain, ankle DF 1/5, PF 1/5, all others [facility]  See FIM for current functional status  Denice Bors 10/14/2014, 11:48 AM

## 2014-10-14 NOTE — Progress Notes (Signed)
Social Work Patient ID: Charlotte Strong, female   DOB: 11/02/1975, 39 y.o.   MRN: [id number] Met with pt to inform of team conference goals of mod/i level and discharge 5/5.  She is pleased with and feels ready to go home.  She has been made mod/i in her room and is moving Around on her own.  The AFO really helps.  She is aware she is not eligible for follow up therapies, she will get team to give her a exercise program to follow up with at home. Will order cane and tub seat for home.

## 2014-10-14 NOTE — Progress Notes (Signed)
Occupational Therapy Discharge Summary and Intervention Session 1 and  Patient Details  Name: Charlotte Strong MRN: [id number] Date of Birth: [birth year]  Today's Date: 10/14/2014 OT Individual Time: 0814-4818 and 1415-1500 OT Individual Time Calculation (min): 60 min and 45 min    Patient has met 9 of 9 long term goals due to improved activity tolerance, improved balance, ability to compensate for deficits, functional use of  LEFT upper and LEFT lower extremity, improved attention, improved awareness and improved coordination.  Patient to discharge at overall Modified Independent level.  Pt will need supervision for shower transfer for safety.    Reasons goals not met: all met  Recommendation:  Patient will benefit from ongoing skilled OT services in outpatient setting to continue to advance functional skills in the area of BADL and iADL.  Equipment: Recommended shower chair  Reasons for discharge: treatment goals met and discharge from hospital  Patient/family agrees with progress made and goals achieved: Yes   OT Intervention: Session 1: Upon entering the room, pt supine in bed with 4/10 c/o pain in L hip with activity. Pt performing supine >sit with no assist. Pt gathering all needed items for self care tasks and ambulating with use of RW into bathroom for toileting and bathing. Toilet transfer and clothing management performed with Mod I think session. Pt ambulating to walk in shower and performed bathing at shower level while seated on TTB. Pt placing L foot onto basin and towel under L hip for comfort. Pt ambulating to sit on edge of bed for dressing tasks with Mod I. Pt then standing at sink with RW for ~ 2 minutes to brush hair and teeth with Mod I. Pt seated in recliner chair with breakfast tray in front of her, call bell, and all needed items within reach upon exiting the room.   Session 2: Upon entering the room, pt seated on EOB awaiting therapist. Pt recently received muscle rub  onto L hip and reports, "soreness" at this time. Pt ambulated in room with [facility] with Mod I to obtain all needed items for session. Pt ambulated 150+ feet to day room with SPC and Mod I. Pt engaged in [place] tasks of practiced medication management. Pt able to easily hold in L hand to pour into R to obtain medications. L hand was able to open pill bottle but unable to manage grip of "pills" with L hand as she dropped 3/6 of them. Pt engaged in stringing small manipulatives onto lace with L hand using pincher grasp with 1/15 drops. Pt ambulating back to room with SPC and mod I. Therapist writing down HEP for theraputty as well as [place] exercises for functional tasks as well as activities pt reports she enjoys. Pt continues to be unable to perform palmer translation task with coins in palm. Pt seated in wheelchair with no other concerns and call bell within reach upon exiting the room.   OT Discharge Precautions/Restrictions  Precautions Precautions: Fall Precaution Comments: L foot drop w/ AFO Required Braces or Orthoses: Other Brace/Splint Other Brace/Splint: Splint L UE resting hand - at night Restrictions Weight Bearing Restrictions: No Pain Pain Assessment Pain Assessment: 0-10 Pain Score: 4  Pain Type: Acute pain Pain Location: Hip Pain Orientation: Left Pain Descriptors / Indicators: Aching Pain Onset: With Activity Patients Stated Pain Goal: 2 Pain Intervention(s): RN made aware;Repositioned;Rest Multiple Pain Sites: No Vision/Perception  Vision- History Baseline Vision/History: No visual deficits  Cognition Overall Cognitive Status: Within Functional Limits for tasks assessed Arousal/Alertness: Awake/alert Orientation  Level: Oriented X4 Attention: Divided Divided Attention: Appears intact Memory: Appears intact Awareness: Appears intact Safety/Judgment: Appears intact Comments: Pt with no safety concerns and is Mod I in room.  Sensation Sensation Light Touch: Appears  Intact Stereognosis: Not tested Hot/Cold: Appears Intact Proprioception: Appears Intact Coordination Gross Motor Movements are Fluid and Coordinated: No Fine Motor Movements are Fluid and Coordinated: No Coordination and Movement Description: Decreased Lydia in L UE/LE. Pt is able to perform functional tasks such as tying shoes but with increased time.  Heel Shin Test: [facility] Motor  Motor Motor: Other (comment) Motor - Discharge Observations: L radial and peroneal nerve palsy, decreased balance Mobility  Bed Mobility Bed Mobility: Supine to Sit;Sit to Supine Supine to Sit: 7: Independent Sit to Supine: 7: Independent Transfers Sit to Stand: 6: Modified independent (Device/Increase time) Stand to Sit: 6: Modified independent (Device/Increase time)  Trunk/Postural Assessment  Cervical Assessment Cervical Assessment: Within Functional Limits Thoracic Assessment Thoracic Assessment: Within Functional Limits Lumbar Assessment Lumbar Assessment: Within Functional Limits Postural Control Postural Control: Within Functional Limits  Balance Balance Balance Assessed: Yes Dynamic Sitting Balance Dynamic Sitting - Balance Support: Feet supported Dynamic Sitting - Level of Assistance: 6: Modified independent (Device/Increase time) Dynamic Sitting - Balance Activities: Lateral lean/weight shifting;Reaching for objects;Reaching across midline Static Standing Balance Static Standing - Balance Support: During functional activity Static Standing - Level of Assistance: 6: Modified independent (Device/Increase time) Extremity/Trunk Assessment RUE Assessment RUE Assessment: Within Functional Limits LUE Assessment LUE Assessment: Exceptions to [facility] LUE Strength LUE Overall Strength: Deficits LUE Overall Strength Comments: 4+/5 shoulder and elbow, 2+/5 for wrist and digits  See FIM for current functional status  Phineas Semen 10/14/2014, 11:40 AM

## 2014-10-14 NOTE — Progress Notes (Addendum)
Recreational Therapy Session Note  Patient Details  Name: Charlotte GailsCrystal Strong MRN: [id number] Date of Birth: 12-27-1975 Today's Date: 10/14/2014  Pain: no c/o Skilled Therapeutic Interventions/Progress Updates: Pt participated in community reintegration/outing [place] grocery [place] [employer]	[place].  Goals focused safe community mobility including breaking down the RW and placing in grocery cart while pushing cart,  Ambulating over various indoor and outdoor surfaces types and inclines, identification and negotiation of obstacles, energy conservation techniques & accessing public restroom.  Pt required supervision/min cues for safety when ambulating over throw rugs/runners in antique shop as well as negotiating tight spaces using RW.  See outing goal sheet in shadow chart for full details.  Therapy/Group: [facility] [employer]   Randale Carvalho 10/14/2014, 2:31 PM

## 2014-10-14 NOTE — Progress Notes (Signed)
Patient has not had a BM since 10/11/14, PRN medication was offered, patient refused.

## 2014-10-14 NOTE — Progress Notes (Signed)
Subjective/Complaints:  Foot and ankle pain bothered her as well as left hip pain.  Left wrist pain improving  No issues overnite , looking forward to D/C Occ cough   Review of Systems - Negative except cough and Left ankle pain  Objective: Vital Signs: Blood pressure 122/86, pulse 97, temperature 98.4 F (36.9 C), temperature source Oral, resp. rate 18, weight 56.8 kg (125 lb 3.5 oz), SpO2 99 %. No results found. Results for orders placed or performed during the hospital encounter of 10/08/14 (from the past 72 hour(s))  CK     Status: None   Collection Time: 10/13/14 11:20 AM  Result Value Ref Range   Total CK 219 38 - 234 U/L  CBC     Status: Abnormal   Collection Time: 10/13/14 11:20 AM  Result Value Ref Range   WBC 8.5 4.0 - 10.5 K/uL   RBC 3.99 3.87 - 5.11 MIL/uL   Hemoglobin 12.4 12.0 - 15.0 g/dL   HCT 36.8 36.0 - 46.0 %   MCV 92.2 78.0 - 100.0 fL   MCH 31.1 26.0 - 34.0 pg   MCHC 33.7 30.0 - 36.0 g/dL   RDW 14.6 11.5 - 15.5 %   Platelets 633 (H) 150 - 400 K/uL  Basic metabolic panel     Status: Abnormal   Collection Time: 10/13/14 11:20 AM  Result Value Ref Range   Sodium 137 135 - 145 mmol/L   Potassium 3.8 3.5 - 5.1 mmol/L   Chloride 98 (L) 101 - 111 mmol/L   CO2 28 22 - 32 mmol/L   Glucose, Bld 98 70 - 99 mg/dL   BUN 14 6 - 20 mg/dL   Creatinine, Ser 0.84 0.44 - 1.00 mg/dL   Calcium 9.0 8.9 - 10.3 mg/dL   GFR calc non Af Amer >60 >60 mL/min   GFR calc Af Amer >60 >60 mL/min    Comment: (NOTE) The eGFR has been calculated using the CKD EPI equation. This calculation has not been validated in all clinical situations. eGFR's persistently <90 mL/min signify possible Chronic Kidney Disease.    Anion gap 11 5 - 15     HEENT: normal Cardio: RRR and no murmur Resp: CTA B/L and unlabored GI: BS positive and NT, ND Extremity:  No Edema Skin:   Wound Dorsum of wrist wound / blisters bandaged Neuro: Alert/Oriented, Anxious, Abnormal Sensory entire hand and  foot  On left with numbess, L thumb actually spared and Abnormal Motor 3- L wrist  And finger ext and finger flexors, Left foot 0/5 ankle DF toe ext, trace PF Musc/Skel:  Swelling Left foot and improved Left wrist Gen NAD but c/o pain with movement   Assessment/Plan: 1. Functional deficits secondary to multiple compression neuropathies and rhabdomyalysis which require 3+ hours per day of interdisciplinary therapy in a comprehensive inpatient rehab setting. Physiatrist is providing close team supervision and 24 hour management of active medical problems listed below. Physiatrist and rehab team continue to assess barriers to discharge/monitor patient progress toward functional and medical goals. Adjusting pain meds, increase gabapentin,no narcotic meds on D/C AFO helpful Team conference today please see physician documentation under team conference tab, met with team face-to-face to discuss problems,progress, and goals. Formulized individual treatment plan based on medical history, underlying problem and comorbidities. FIM: FIM - Bathing Bathing Steps Patient Completed: Chest, Right Arm, Left Arm, Abdomen, Front perineal area, Buttocks, Right upper leg, Left upper leg, Right lower leg (including foot), Left lower leg (including foot) Bathing:  5: Supervision: Safety issues/verbal cues  FIM - Upper Body Dressing/Undressing Upper body dressing/undressing steps patient completed: Thread/unthread right sleeve of pullover shirt/dresss, Thread/unthread left sleeve of pullover shirt/dress, Put head through opening of pull over shirt/dress, Pull shirt over trunk Upper body dressing/undressing: 5: Set-up assist to: Obtain clothing/put away FIM - Lower Body Dressing/Undressing Lower body dressing/undressing steps patient completed: Thread/unthread right underwear leg, Thread/unthread left underwear leg, Pull underwear up/down, Thread/unthread right pants leg, Thread/unthread left pants leg, Pull pants  up/down, Don/Doff right sock, Don/Doff left sock Lower body dressing/undressing: 5: Set-up assist to: Obtain clothing  FIM - Toileting Toileting steps completed by patient: Adjust clothing prior to toileting, Performs perineal hygiene, Adjust clothing after toileting Toileting Assistive Devices: Grab bar or rail for support Toileting: 5: Supervision: Safety issues/verbal cues  FIM - Radio producer Devices: Recruitment consultant Transfers: 4-To toilet/BSC: Min A (steadying Pt. > 75%)  FIM - Bed/Chair Transfer Bed/Chair Transfer Assistive Devices: Copy: 6: Sit > Supine: No assist  FIM - Locomotion: Wheelchair Locomotion: Wheelchair: 0: Activity did not occur FIM - Locomotion: Ambulation Locomotion: Ambulation Assistive Devices: Administrator Ambulation/Gait Assistance: 5: Supervision Locomotion: Ambulation: 5: Travels 150 ft or more with supervision/safety issues  Comprehension Comprehension Mode: Auditory Comprehension: 7-Follows complex conversation/direction: With no assist  Expression Expression Mode: Verbal Expression: 7-Expresses complex ideas: With no assist  Social Interaction Social Interaction: 7-Interacts appropriately with others - No medications needed.  Problem Solving Problem Solving: 7-Solves complex problems: Recognizes & self-corrects  Memory Memory: 7-Complete Independence: No helper  Medical Problem List and Plan: 1. 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 DVT Prophylaxis/Anticoagulation: Pharmaceutical: Lovenox, stop prior to d/c 3. Pain Management: Started on neurontin and cymbalta yesterday--titrate upward for symptom management.Add medrol for causalgia Continue oxycodone prn for now. No oxy at home, will rx Ibuprofen 871m at  D/C, much of her pain is from Rhabdo Left hip 4. Anxiety/Mood: team to provide ego support and encouragement. LCSW to follow for evaluation and support. Will continue low dose xanax for now.  5. Neuropsych: This patient is capable of making decisions on her own behalf. 6. Skin/Wound Care: silvadene to foot wound. Continue pressure relief measures.  7. Fluids/Electrolytes/Nutrition: Monitor I/O. Offer supplements between meals. 8. Polysubstance abuse: Need to limit narcotic due to prior history. Attempted to start drug rehab program 06/2013? 9. Sepsis with Aspiration PNA/ UTI: Encourage IS. Antibiotics changed to levaquinRecheck CXR at end of week   10. Rhabdomyolysis: Repeat lytes ,   CK normal 11. ABLA: resolved  LOS (Days) 6 A FACE TO FACE EVALUATION WAS PERFORMED  Charlotte Strong 10/14/2014, 7:15 AM

## 2014-10-15 LAB — CREATININE, SERUM
Creatinine, Ser: 0.73 mg/dL (ref 0.44–1.00)
GFR calc Af Amer: >60 mL/min (ref >60)
GFR calc non Af Amer: >60 mL/min (ref >60)

## 2014-10-15 LAB — PHOSPHORUS: PHOSPHORUS: 5 mg/dL — AB (ref 2.5–4.6)

## 2014-10-15 MED ORDER — ALBUTEROL SULFATE HFA 108 (90 BASE) MCG/ACT IN AERS: 1.0000 | INHALATION_SPRAY | Freq: Four times a day (QID) | RESPIRATORY_TRACT | Status: DC | PRN

## 2014-10-15 MED ORDER — DOCUSATE SODIUM 100 MG PO CAPS: 100.0000 mg | ORAL_CAPSULE | Freq: Two times a day (BID) | ORAL | Status: DC

## 2014-10-15 MED ORDER — DULOXETINE HCL 30 MG PO CPEP: 30.0000 mg | ORAL_CAPSULE | Freq: Every day | ORAL | Status: DC

## 2014-10-15 MED ORDER — FAMOTIDINE 40 MG PO TABS: 40.0000 mg | ORAL_TABLET | Freq: Every day | ORAL | Status: DC

## 2014-10-15 MED ORDER — IBUPROFEN 800 MG PO TABS: 800.0000 mg | ORAL_TABLET | Freq: Three times a day (TID) | ORAL | Status: DC | PRN

## 2014-10-15 MED ORDER — MUSCLE RUB 10-15 % EX CREA: 1.0000 "application " | TOPICAL_CREAM | Freq: Three times a day (TID) | CUTANEOUS | Status: DC

## 2014-10-15 MED ORDER — BENZONATATE 100 MG PO CAPS: 100.0000 mg | ORAL_CAPSULE | Freq: Two times a day (BID) | ORAL | Status: DC | PRN

## 2014-10-15 MED ORDER — GABAPENTIN 300 MG PO CAPS: 300.0000 mg | ORAL_CAPSULE | Freq: Three times a day (TID) | ORAL | Status: DC

## 2014-10-15 MED ORDER — TRAZODONE HCL 50 MG PO TABS: 50.0000 mg | ORAL_TABLET | Freq: Every evening | ORAL | Status: DC | PRN

## 2014-10-15 NOTE — Progress Notes (Signed)
Recreational Therapy Discharge Summary Patient Details  Name: Charlotte Strong MRN: [id number] Date of Birth: 1975-07-19 Today's Date: 10/15/2014  Long term goals set: 1  Long term goals met: 1  Comments on progress toward goals: Pt has made great progress during LOS and is ready for discharge home today with intermittent supervision/assist.  Pt participated in community reintegration/outing at overall supervision level needing min cues to negotiate obstacles/safety.    Reasons for discharge: discharge from hospital  Patient/family agrees with progress made and goals achieved: Yes  Arkel Cartwright 10/15/2014, 4:59 PM

## 2014-10-15 NOTE — Discharge Instructions (Signed)
Inpatient Rehab Discharge Instructions  Charlotte Strong Discharge date and time:    Activities/Precautions/ Functional Status: Activity: activity as tolerated Diet: regular diet Wound Care: keep wound clean and dry   Functional status:  ___ No restrictions     ___ Walk up steps independently ___ 24/7 supervision/assistance   ___ Walk up steps with assistance ___ Intermittent supervision/assistance  ___ Bathe/dress independently ___ Walk with walker     ___ Bathe/dress with assistance ___ Walk Independently    ___ Shower independently ___ Walk with assistance    ___ Shower with assistance _X__ No alcohol     ___ Return to work/school ________  Special Instructions:    COMMUNITY REFERRALS UPON DISCHARGE:   NOT ELIGIBLE FOR FOLLOW UP THERAPIES DUE TO MEDICAID PENDING AND DOES NOT MEET MEDICAID CRITERIA FOR FOLLOW UP VISITS   Medical Equipment/Items Ordered:CANE & TUB SEAT  Agency/Supplier:[facility]  [phone] Other:[employer]  My questions have been answered and I understand these instructions. I will adhere to these goals and the provided educational materials after my discharge from the hospital.  Patient/Caregiver Signature _______________________________ Date __________  Clinician Signature _______________________________________ Date __________  Please bring this form and your medication list with you to all your follow-up doctor's appointments.

## 2014-10-15 NOTE — Discharge Summary (Signed)
Physician Discharge Summary  Patient ID: Charlotte GailsCrystal Sporer MRN: [id number] DOB/AGE: 1976-02-03 39 y.o.  Admit date: 10/08/2014 Discharge date: 10/15/2014  Discharge Diagnoses:  Principal Problem:   Substance induced mood disorder Active Problems:   Compartment syndrome of lower extremity   Episodic sedative or hypnotic abuse   Discharged Condition: Stable   Significant Diagnostic Studies: Dg Chest 2 View  10/14/2014   CLINICAL DATA:  Cough, passed history of aspiration pneumonia.  EXAM: CHEST  2 VIEW  COMPARISON:  PA and lateral chest of October 09, 2014  FINDINGS: The lungs are well-expanded. The pulmonary interstitial markings remain increased but have improved. Areas of patchy confluence in the right lung are less conspicuous today. The hemidiaphragm on the right is better demonstrated. The heart and pulmonary vascularity are normal. The mediastinum is normal in width. The right-sided PICC line tip projects over the midportion of the SVC. There is no pleural effusion.  IMPRESSION: There has been mild interval improvement in the appearance of the interstitial pneumonia especially on the right the interstitial markings remain mildly increased. This may be in part due to the patient's smoking history.  Assuming continued clinical improvement, followup PA and lateral chest X-ray is recommended in 3-4 weeks to ensure resolution.   Electronically Signed   By: David  SwazilandJordan M.D.   On: 10/14/2014 08:42   Dg Chest 2 View  10/09/2014   CLINICAL DATA:  Cough for 3 days.  Chest pain  EXAM: CHEST  2 VIEW  COMPARISON:  10/08/2014  FINDINGS: There is a right arm PICC line with tip in the projection of the cavoatrial junction. Heart size appears within normal limits. Bilateral multi focal airspace opacities are again noted right greater than left. Compared with previous exam there has been no significant change in aeration to the lungs.  IMPRESSION: 1. Persistent, bilateral asymmetric airspace opacities compatible  with multifocal pneumonia.   Electronically Signed   By: Signa Kellaylor  Stroud M.D.   On: 10/09/2014 11:39    Labs:  Basic Metabolic Panel:  Recent Labs Lab 10/09/14 0455 10/13/14 1120 10/15/14 0530  NA 142 137  --   K 3.6 3.8  --   CL 105 98*  --   CO2 27 28  --   GLUCOSE 114* 98  --   BUN <5* 14  --   CREATININE 0.70 0.84 0.73  CALCIUM 8.5 9.0  --     CBC:  Recent Labs Lab 10/09/14 0455 10/13/14 1120  WBC 6.8 8.5  NEUTROABS 4.8  --   HGB 9.7* 12.4  HCT 28.8* 36.8  MCV 92.0 92.2  PLT 326 633*    CBG: No results for input(s): GLUCAP in the last 168 hours.  Brief HPI:   Charlotte Strong is a 39 y.o. female with history of drug abuse, asthma who was admitted on 4/20 by pulmonary critical care for drug overdose on benzodiazepines and opioids, found to have rhabdomyolysis and respiratory failure. Patient was found by family in the bathroom floor at midnight, unresponsive and agonal breathing. She was intubated in ED and has been treated for AKI/ATN due to rhabdomyolysis and aspiration PNA.  She was noted to have areas of induration on the left lower extremity, left gluteus, left wrist. Dr. Darrick PennaFields recommended monitoring dopplers due to concerns of tight left calf anterior compartment syndrome.  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 Melvyn Novasrtmann.   She has had worsening of respiratory status and antibiotics changed  to Levaquin for broader treatment.   Therapy initiated and patient noted to be limited by left foot drop, left wrist palsy as well as deconditioning. CIR recommended by MD and Rehab team.    Hospital Course: Hanae Christella HartiganJacobs was admitted to rehab 10/08/2014 for inpatient therapies to consist of PT and OT at least three hours five days a week. Past admission physiatrist, therapy team and rehab RN have worked together to provide customized collaborative inpatient rehab. Respiratory status has been stable and patient was maintained  on Levaquin for an additional week to complete her antibiotic regimen.  Follow up CXR was done to monitor for stability and shows interval clearing.  ABLA has resolved with rise in H/H to 12.4/36.8. Follow up lytes reveal that acute renal failure has resolved with normalization of CK to 219. She has been slowly weaned off narcotics and gabapentin was titrated to help with neuropathic pain.  She has had improvement in left wrist strength and movement to 3-/5 and continues to have foot drop. She has made good progress during her rehab stay and is independent at discharge.    Rehab course: During patient's stay in rehab weekly team conferences were held to monitor patient's progress, set goals and discuss barriers to discharge. Patient has had improvement in activity tolerance, balance, postural control, as well as ability to compensate for deficits.  She is able to complete ADL tasks independently and is ambulating 400' with RW. No family education needed due to independent state. Family will provide intermittent assistance as needed past discharge.    Disposition:  Home   Diet: Regular.      Medication List    STOP taking these medications        acetaminophen 500 MG tablet  Commonly known as:  TYLENOL     acyclovir 400 MG tablet  Commonly known as:  ZOVIRAX      TAKE these medications        benzonatate 100 MG capsule  Commonly known as:  TESSALON  Take 1 capsule (100 mg total) by mouth 2 (two) times daily as needed for cough.     DULoxetine 30 MG capsule  Commonly known as:  CYMBALTA  Take 1 capsule (30 mg total) by mouth daily.     famotidine 40 MG tablet  Commonly known as:  PEPCID  Take 1 tablet (40 mg total) by mouth daily. While taking ibuprofen     gabapentin 300 MG capsule  Commonly known as:  NEURONTIN  Take 1 capsule (300 mg total) by mouth 3 (three) times daily.     ibuprofen 800 MG tablet  Commonly known as:  ADVIL,MOTRIN  Take 1 tablet (800 mg total) by mouth  every 8 (eight) hours as needed.     MUSCLE RUB 10-15 % Crea  Apply 1 application topically 4 (four) times daily -  before meals and at bedtime. To left hip     traZODone 50 MG tablet  Commonly known as:  DESYREL  Take 1-2 tablets (50-100 mg total) by mouth at bedtime as needed for sleep.           Follow-up Information    Follow up with Erick ColaceKIRSTEINS,ANDREW E, MD On 11/23/2014.   Specialty:  Physical Medicine and Rehabilitation   Why:  be there at 9:45 am for 10:15 am appointment    Contact information:   [address] [place] [address] [phone]       Call Nadara MustardUDA,MARCUS V, MD.  Specialty:  Orthopedic Surgery   Why:  As needed   Contact information:   [address] [place] [address] [phone]       Call Sharma Covert, MD.   Specialty:  Orthopedic Surgery   Why:  for ongoing issues with wrist drop.    Contact information:   [address] [place] [address] [phone]       Signed: Jacquelynn Cree 10/15/2014, 9:06 AM

## 2014-10-15 NOTE — Progress Notes (Signed)
Subjective/Complaints:  Foot and ankle pain bothered her as well as left hip pain.  Left wrist pain improving  No issues overnite , looking forward to D/C Occ cough   Review of Systems - Negative except cough and Left ankle pain  Objective: Vital Signs: Blood pressure 119/70, pulse 85, temperature 98.3 F (36.8 C), temperature source Oral, resp. rate 18, weight 56.8 kg (125 lb 3.5 oz), SpO2 99 %. Dg Chest 2 View  10/14/2014   CLINICAL DATA:  Cough, passed history of aspiration pneumonia.  EXAM: CHEST  2 VIEW  COMPARISON:  PA and lateral chest of October 09, 2014  FINDINGS: The lungs are well-expanded. The pulmonary interstitial markings remain increased but have improved. Areas of patchy confluence in the right lung are less conspicuous today. The hemidiaphragm on the right is better demonstrated. The heart and pulmonary vascularity are normal. The mediastinum is normal in width. The right-sided PICC line tip projects over the midportion of the SVC. There is no pleural effusion.  IMPRESSION: There has been mild interval improvement in the appearance of the interstitial pneumonia especially on the right the interstitial markings remain mildly increased. This may be in part due to the patient's smoking history.  Assuming continued clinical improvement, followup PA and lateral chest X-ray is recommended in 3-4 weeks to ensure resolution.   Electronically Signed   By: David  Martinique M.D.   On: 10/14/2014 08:42   Results for orders placed or performed during the hospital encounter of 10/08/14 (from the past 72 hour(s))  CK     Status: None   Collection Time: 10/13/14 11:20 AM  Result Value Ref Range   Total CK 219 38 - 234 U/L  CBC     Status: Abnormal   Collection Time: 10/13/14 11:20 AM  Result Value Ref Range   WBC 8.5 4.0 - 10.5 K/uL   RBC 3.99 3.87 - 5.11 MIL/uL   Hemoglobin 12.4 12.0 - 15.0 g/dL   HCT 36.8 36.0 - 46.0 %   MCV 92.2 78.0 - 100.0 fL   MCH 31.1 26.0 - 34.0 pg   MCHC 33.7  30.0 - 36.0 g/dL   RDW 14.6 11.5 - 15.5 %   Platelets 633 (H) 150 - 400 K/uL  Basic metabolic panel     Status: Abnormal   Collection Time: 10/13/14 11:20 AM  Result Value Ref Range   Sodium 137 135 - 145 mmol/L   Potassium 3.8 3.5 - 5.1 mmol/L   Chloride 98 (L) 101 - 111 mmol/L   CO2 28 22 - 32 mmol/L   Glucose, Bld 98 70 - 99 mg/dL   BUN 14 6 - 20 mg/dL   Creatinine, Ser 0.84 0.44 - 1.00 mg/dL   Calcium 9.0 8.9 - 10.3 mg/dL   GFR calc non Af Amer >60 >60 mL/min   GFR calc Af Amer >60 >60 mL/min    Comment: (NOTE) The eGFR has been calculated using the CKD EPI equation. This calculation has not been validated in all clinical situations. eGFR's persistently <90 mL/min signify possible Chronic Kidney Disease.    Anion gap 11 5 - 15  Creatinine, serum     Status: None   Collection Time: 10/15/14  5:30 AM  Result Value Ref Range   Creatinine, Ser 0.73 0.44 - 1.00 mg/dL   GFR calc non Af Amer >60 >60 mL/min   GFR calc Af Amer >60 >60 mL/min    Comment: (NOTE) The eGFR has been calculated using the CKD  EPI equation. This calculation has not been validated in all clinical situations. eGFR's persistently <90 mL/min signify possible Chronic Kidney Disease.      HEENT: normal Cardio: RRR and no murmur Resp: CTA B/L and unlabored GI: BS positive and NT, ND Extremity:  No Edema Skin:   Wound Dorsum of wrist wound / blisters bandaged Neuro: Alert/Oriented, Anxious, Abnormal Sensory entire hand and foot  On left with numbess, L thumb actually spared and Abnormal Motor 3- L wrist  And finger ext and finger flexors, Left foot 0/5 ankle DF toe ext, trace PF Musc/Skel:  Swelling Left foot and improved Left wrist Gen NAD but c/o pain with movement   Assessment/Plan: 1. Functional deficits secondary to multiple compression neuropathies and rhabdomyalysis  Stable for D/C today F/u PCP in 1-2 weeks F/u PM&R 3-4 weeks- will assess whether EMG is needed at that time See D/C summary See  D/C instructions FIM: FIM - Bathing Bathing Steps Patient Completed: Chest, Right Arm, Left Arm, Abdomen, Front perineal area, Buttocks, Right upper leg, Left upper leg, Right lower leg (including foot), Left lower leg (including foot) Bathing: 6: More than reasonable amount of time  FIM - Upper Body Dressing/Undressing Upper body dressing/undressing steps patient completed: Thread/unthread right sleeve of pullover shirt/dresss, Thread/unthread left sleeve of pullover shirt/dress, Put head through opening of pull over shirt/dress, Pull shirt over trunk, Thread/unthread left bra strap, Thread/unthread right bra strap, Hook/unhook bra Upper body dressing/undressing: 6: More than reasonable amount of time FIM - Lower Body Dressing/Undressing Lower body dressing/undressing steps patient completed: Thread/unthread right underwear leg, Thread/unthread left underwear leg, Pull underwear up/down, Thread/unthread right pants leg, Thread/unthread left pants leg, Pull pants up/down, Don/Doff right sock, Don/Doff left sock, Don/Doff right shoe, Don/Doff left shoe, Fasten/unfasten right shoe, Fasten/unfasten left shoe Lower body dressing/undressing: 6: More than reasonable amount of time  FIM - Toileting Toileting steps completed by patient: Adjust clothing prior to toileting, Performs perineal hygiene, Adjust clothing after toileting Toileting Assistive Devices: Grab bar or rail for support Toileting: 6: More than reasonable amount of time  FIM - Radio producer Devices: Bedside commode Toilet Transfers: 6-More than reasonable amt of time, 6-From toilet/BSC  FIM - Control and instrumentation engineer Devices: Copy: 7: Supine > Sit: No assist, 7: Sit > Supine: No assist, 6: Bed > Chair or W/C: No assist, 6: Chair or W/C > Bed: No assist  FIM - Locomotion: Wheelchair Distance:  (pt ambulatory) Locomotion: Wheelchair: 0: Activity did not  occur FIM - Locomotion: Ambulation Locomotion: Ambulation Assistive Devices: Journalist, newspaper Ambulation/Gait Assistance: 6: Modified independent (Device/Increase time) Locomotion: Ambulation: 6: Travels 150 ft or more with assistive device/no helper  Comprehension Comprehension Mode: Auditory Comprehension: 7-Follows complex conversation/direction: With no assist  Expression Expression Mode: Verbal Expression: 7-Expresses complex ideas: With no assist  Social Interaction Social Interaction: 7-Interacts appropriately with others - No medications needed.  Problem Solving Problem Solving: 7-Solves complex problems: Recognizes & self-corrects  Memory Memory: 7-Complete Independence: No helper  Medical Problem List and Plan: 1. 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 DVT Prophylaxis/Anticoagulation: Pharmaceutical: Lovenox, stop prior to d/c 3. Pain Management: Started on neurontin and cymbalta yesterday--titrate upward for symptom management.Add medrol for causalgia Continue oxycodone prn for now. No oxy at home, will rx Ibuprofen 855m at D/C, much of her pain is from Rhabdo Left hip  4. Anxiety/Mood: team to provide ego support and encouragement. LCSW to follow for evaluation and support. Will continue   LOS (Days) 7 A FACE TO FACE EVALUATION WAS PERFORMED  KIRSTEINS,ANDREW E 10/15/2014, 7:25 AM
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
## 2014-10-20 ENCOUNTER — Inpatient Hospital Stay: Payer: Self-pay | Admitting: Family Medicine

## 2014-10-28 ENCOUNTER — Telehealth: Payer: Self-pay | Admitting: *Deleted

## 2014-10-28 MED ORDER — GABAPENTIN 300 MG PO CAPS: 300.0000 mg | ORAL_CAPSULE | Freq: Four times a day (QID) | ORAL | Status: DC

## 2014-10-28 NOTE — Telephone Encounter (Signed)
Pt called and notified about change in dose frequency, new Rx sent to pharmacy to reflect the change in the sig

## 2014-10-28 NOTE — Telephone Encounter (Signed)
Increase gabapentin to 300mg  QID

## 2014-10-28 NOTE — Telephone Encounter (Signed)
Pt called, discharged 10/08/2014 from the hospital, has upcoming hospital f/u 11/23/2014 with you. Pt says the medication for pain issued on discharge is not helping. After reviewing the discharge summary I can see why she was in the hospital and not given opioid medication. However, she is asking if you have any recommendations to help her interim. You have no hospital f/u slots available prior to her established appt

## 2014-11-02 ENCOUNTER — Encounter: Payer: Self-pay | Admitting: Physical Medicine & Rehabilitation

## 2014-11-02 ENCOUNTER — Telehealth: Payer: Self-pay | Admitting: *Deleted

## 2014-11-02 NOTE — Telephone Encounter (Signed)
Called the pt to inform that the letter was written and signed. Mom answered the phone. I asked if they would like to come by and pick up the letter or if they would like us to mail it to them. Mom said she would call the daughter and would get back to us. Meanwhile the letter has been placed up in the collapsable folder up front in the reception area.

## 2014-11-02 NOTE — Telephone Encounter (Signed)
Pt called asking for a note directed towards the Dept. Of Social Services stating that the pt is currently not fit for work and most likely not available for work for the next couple of months. She is seeking assistance, particularily regarding food stamps. Pt was hospital discharged 10/08/2014 and is scheduled to be seen by you 11/23/2014

## 2014-11-02 NOTE — Telephone Encounter (Signed)
Did letter

## 2014-11-03 NOTE — Telephone Encounter (Signed)
Contacted the pt again, they would like us to mail it. Address provided....letter sent

## 2014-11-19 ENCOUNTER — Telehealth: Payer: Self-pay | Admitting: *Deleted

## 2014-11-19 NOTE — Telephone Encounter (Signed)
Left message to call back to verify address

## 2014-11-23 ENCOUNTER — Inpatient Hospital Stay: Payer: Self-pay | Admitting: Physical Medicine & Rehabilitation

## 2014-11-23 ENCOUNTER — Ambulatory Visit: Payer: Self-pay

## 2014-12-01 ENCOUNTER — Encounter: Payer: Medicaid Other | Attending: Physical Medicine & Rehabilitation

## 2014-12-01 ENCOUNTER — Ambulatory Visit (HOSPITAL_BASED_OUTPATIENT_CLINIC_OR_DEPARTMENT_OTHER): Payer: Medicaid Other | Admitting: Physical Medicine & Rehabilitation

## 2014-12-01 ENCOUNTER — Encounter: Payer: Self-pay | Admitting: Physical Medicine & Rehabilitation

## 2014-12-01 VITALS — BP 131/95 | HR 95 | Resp 16

## 2014-12-01 DIAGNOSIS — M21372 Foot drop, left foot: Secondary | ICD-10-CM | POA: Insufficient documentation

## 2014-12-01 DIAGNOSIS — M21332 Wrist drop, left wrist: Secondary | ICD-10-CM | POA: Insufficient documentation

## 2014-12-01 MED ORDER — GABAPENTIN 400 MG PO CAPS: 400.0000 mg | ORAL_CAPSULE | Freq: Four times a day (QID) | ORAL | Status: DC

## 2014-12-01 MED ORDER — DULOXETINE HCL 60 MG PO CPEP: 60.0000 mg | ORAL_CAPSULE | Freq: Every day | ORAL | Status: DC

## 2014-12-01 NOTE — Progress Notes (Signed)
   Subjective:    Patient ID: Charlotte Strong, female    DOB: [birth year], 39 y.o.   MRN: [id number]  HPI    Review of Systems     Objective:   Physical Exam        Assessment & Plan:
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
## 2014-12-01 NOTE — Patient Instructions (Signed)
Gabapentin increased dose , this may

## 2014-12-07 ENCOUNTER — Emergency Department (HOSPITAL_COMMUNITY)
Admission: EM | Admit: 2014-12-07 | Discharge: 2014-12-07 | Disposition: A | Discharge status: Home / Self Care | Payer: Medicaid Other | Attending: Emergency Medicine | Admitting: Emergency Medicine

## 2014-12-07 ENCOUNTER — Emergency Department (HOSPITAL_COMMUNITY): Payer: Medicaid Other

## 2014-12-07 ENCOUNTER — Encounter (HOSPITAL_COMMUNITY): Payer: Self-pay | Admitting: Emergency Medicine

## 2014-12-07 DIAGNOSIS — Y998 Other external cause status: Secondary | ICD-10-CM | POA: Diagnosis not present

## 2014-12-07 DIAGNOSIS — Z79899 Other long term (current) drug therapy: Secondary | ICD-10-CM | POA: Diagnosis not present

## 2014-12-07 DIAGNOSIS — W1839XA Other fall on same level, initial encounter: Secondary | ICD-10-CM | POA: Insufficient documentation

## 2014-12-07 DIAGNOSIS — Z87440 Personal history of urinary (tract) infections: Secondary | ICD-10-CM | POA: Insufficient documentation

## 2014-12-07 DIAGNOSIS — Y9389 Activity, other specified: Secondary | ICD-10-CM | POA: Insufficient documentation

## 2014-12-07 DIAGNOSIS — Z720 Tobacco use: Secondary | ICD-10-CM | POA: Diagnosis not present

## 2014-12-07 DIAGNOSIS — F329 Major depressive disorder, single episode, unspecified: Secondary | ICD-10-CM | POA: Insufficient documentation

## 2014-12-07 DIAGNOSIS — S6992XA Unspecified injury of left wrist, hand and finger(s), initial encounter: Secondary | ICD-10-CM | POA: Diagnosis not present

## 2014-12-07 DIAGNOSIS — Z880 Allergy status to penicillin: Secondary | ICD-10-CM | POA: Insufficient documentation

## 2014-12-07 DIAGNOSIS — S99922A Unspecified injury of left foot, initial encounter: Secondary | ICD-10-CM | POA: Diagnosis present

## 2014-12-07 DIAGNOSIS — Y9289 Other specified places as the place of occurrence of the external cause: Secondary | ICD-10-CM | POA: Insufficient documentation

## 2014-12-07 DIAGNOSIS — S8992XA Unspecified injury of left lower leg, initial encounter: Secondary | ICD-10-CM | POA: Insufficient documentation

## 2014-12-07 DIAGNOSIS — J45909 Unspecified asthma, uncomplicated: Secondary | ICD-10-CM | POA: Insufficient documentation

## 2014-12-07 DIAGNOSIS — F419 Anxiety disorder, unspecified: Secondary | ICD-10-CM | POA: Diagnosis not present

## 2014-12-07 DIAGNOSIS — G479 Sleep disorder, unspecified: Secondary | ICD-10-CM | POA: Insufficient documentation

## 2014-12-07 DIAGNOSIS — M79605 Pain in left leg: Secondary | ICD-10-CM

## 2014-12-07 MED ORDER — CYCLOBENZAPRINE HCL 10 MG PO TABS
10.0000 mg | ORAL_TABLET | Freq: Once | ORAL | Status: AC
  Administered 2014-12-07: 10 mg via ORAL
  Filled 2014-12-07: qty 1

## 2014-12-07 MED ORDER — GABAPENTIN 800 MG PO TABS: 400.0000 mg | ORAL_TABLET | Freq: Once | ORAL | Status: DC

## 2014-12-07 MED ORDER — GABAPENTIN 400 MG PO CAPS
400.0000 mg | ORAL_CAPSULE | Freq: Once | ORAL | Status: AC
  Administered 2014-12-07: 400 mg via ORAL
  Filled 2014-12-07: qty 1

## 2014-12-07 MED ORDER — GABAPENTIN 400 MG PO CAPS: 400.0000 mg | ORAL_CAPSULE | Freq: Four times a day (QID) | ORAL | Status: DC

## 2014-12-07 MED ORDER — KETOROLAC TROMETHAMINE 30 MG/ML IJ SOLN
30.0000 mg | Freq: Once | INTRAMUSCULAR | Status: AC
  Administered 2014-12-07: 30 mg via INTRAVENOUS
  Filled 2014-12-07: qty 1

## 2014-12-07 MED ORDER — MORPHINE SULFATE 4 MG/ML IJ SOLN
4.0000 mg | Freq: Once | INTRAMUSCULAR | Status: AC
  Administered 2014-12-07: 4 mg via INTRAVENOUS
  Filled 2014-12-07: qty 1

## 2014-12-07 NOTE — Discharge Instructions (Signed)
·   Thank you for allowing us to be involved in your healthcare while you were hospitalized at [facility].   Please note that there have not been changes to your home medications.  --> PLEASE LOOK AT YOUR DISCHARGE MEDICATION LIST FOR DETAILS.  Please call your PCP if you have any questions or concerns, or any difficulty getting any of your medications.  Please return to the ER if you have worsening of your symptoms or new severe symptoms arise.  You can take Naproxen (Aleve) 500 mg twice a day for pain. Take Gabapentin 400 mg 4 times a day for pain. Follow up with Dr. Wynn BankerKirsteins.

## 2014-12-07 NOTE — ED Notes (Signed)
Pt given 100 mcg of fentanyl by EMS. Seems to have helped some.
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
## 2014-12-07 NOTE — ED Notes (Signed)
Spoke with patient about pain treatment. Will receive pain medication after xray.

## 2014-12-07 NOTE — ED Notes (Signed)
Pt hx of drug abuse. Pt pain easily controlled and distracted by conversation.

## 2014-12-07 NOTE — ED Notes (Signed)
Per [facility], pt states she has drop leg syndrome. Hx since April. Pt states her brace is not helping. Most of the pain is her left leg, but partially left arm as well. Pt is moaning in bed, pain 10/10. Pt has been drinking alcohol. AAOX4. Pt can ambulate with assistance per EMS.

## 2014-12-11 ENCOUNTER — Inpatient Hospital Stay: Payer: Self-pay | Admitting: Family Medicine

## 2015-01-05 ENCOUNTER — Ambulatory Visit: Payer: Self-pay | Admitting: Physical Medicine & Rehabilitation

## 2015-03-22 ENCOUNTER — Ambulatory Visit: Payer: Medicaid Other | Admitting: Physical Medicine & Rehabilitation

## 2015-03-22 ENCOUNTER — Encounter: Payer: Medicaid Other | Attending: Physical Medicine & Rehabilitation

## 2015-03-22 DIAGNOSIS — M21372 Foot drop, left foot: Secondary | ICD-10-CM | POA: Insufficient documentation

## 2015-03-22 DIAGNOSIS — M21332 Wrist drop, left wrist: Secondary | ICD-10-CM | POA: Insufficient documentation

## 2015-05-22 ENCOUNTER — Encounter (HOSPITAL_COMMUNITY): Payer: Self-pay | Admitting: *Deleted

## 2015-05-22 ENCOUNTER — Inpatient Hospital Stay (HOSPITAL_COMMUNITY)
Admission: AD | Admit: 2015-05-22 | Discharge: 2015-05-22 | Disposition: A | Discharge status: Home / Self Care | Payer: Medicaid Other | Source: Ambulatory Visit | Attending: Obstetrics & Gynecology | Admitting: Obstetrics & Gynecology

## 2015-05-22 DIAGNOSIS — R109 Unspecified abdominal pain: Secondary | ICD-10-CM | POA: Diagnosis present

## 2015-05-22 DIAGNOSIS — Z9851 Tubal ligation status: Secondary | ICD-10-CM | POA: Insufficient documentation

## 2015-05-22 DIAGNOSIS — Z880 Allergy status to penicillin: Secondary | ICD-10-CM | POA: Insufficient documentation

## 2015-05-22 DIAGNOSIS — F17210 Nicotine dependence, cigarettes, uncomplicated: Secondary | ICD-10-CM | POA: Diagnosis not present

## 2015-05-22 DIAGNOSIS — R1084 Generalized abdominal pain: Secondary | ICD-10-CM | POA: Diagnosis not present

## 2015-05-22 DIAGNOSIS — Z3202 Encounter for pregnancy test, result negative: Secondary | ICD-10-CM | POA: Insufficient documentation

## 2015-05-22 LAB — URINALYSIS, ROUTINE W REFLEX MICROSCOPIC
Glucose, UA: NEGATIVE mg/dL
HGB URINE DIPSTICK: NEGATIVE
KETONES UR: 40 mg/dL — AB
LEUKOCYTES UA: NEGATIVE
Nitrite: NEGATIVE
PH: 6 (ref 5.0–8.0)
Protein, ur: NEGATIVE mg/dL
Specific Gravity, Urine: 1.025 (ref 1.005–1.030)

## 2015-05-22 LAB — POCT PREGNANCY, URINE: Preg Test, Ur: NEGATIVE

## 2015-05-22 LAB — RAPID URINE DRUG SCREEN, HOSP PERFORMED
Amphetamines: POSITIVE — AB
BARBITURATES: NOT DETECTED
Benzodiazepines: POSITIVE — AB
Cocaine: NOT DETECTED
Opiates: NOT DETECTED
TETRAHYDROCANNABINOL: POSITIVE — AB

## 2015-05-22 NOTE — MAU Note (Signed)
Pt unable to find ride home cab voucher provided.
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
## 2015-05-22 NOTE — Discharge Instructions (Signed)

## 2015-05-22 NOTE — MAU Note (Signed)
Pt reports she has been having abd pain for the past 2 hours. Stated she went to the bathroom and thought she saw a "hand or A foot hanging out of her vagina. wshe then used a mirror and still saw it. Called 911. EMS arrived. [facility] did not see anything hanging out and nothing observed now either.

## 2015-05-25 ENCOUNTER — Encounter (HOSPITAL_COMMUNITY): Payer: Self-pay | Admitting: Neurology

## 2015-05-25 ENCOUNTER — Emergency Department (HOSPITAL_COMMUNITY)
Admission: EM | Admit: 2015-05-25 | Discharge: 2015-05-25 | Disposition: A | Discharge status: Home / Self Care | Payer: Medicaid Other | Attending: Emergency Medicine | Admitting: Emergency Medicine

## 2015-05-25 DIAGNOSIS — Z87440 Personal history of urinary (tract) infections: Secondary | ICD-10-CM | POA: Insufficient documentation

## 2015-05-25 DIAGNOSIS — F1190 Opioid use, unspecified, uncomplicated: Secondary | ICD-10-CM | POA: Diagnosis not present

## 2015-05-25 DIAGNOSIS — Z79899 Other long term (current) drug therapy: Secondary | ICD-10-CM | POA: Insufficient documentation

## 2015-05-25 DIAGNOSIS — Z880 Allergy status to penicillin: Secondary | ICD-10-CM | POA: Diagnosis not present

## 2015-05-25 DIAGNOSIS — F1390 Sedative, hypnotic, or anxiolytic use, unspecified, uncomplicated: Secondary | ICD-10-CM | POA: Diagnosis present

## 2015-05-25 DIAGNOSIS — F17210 Nicotine dependence, cigarettes, uncomplicated: Secondary | ICD-10-CM | POA: Diagnosis not present

## 2015-05-25 DIAGNOSIS — J45909 Unspecified asthma, uncomplicated: Secondary | ICD-10-CM | POA: Insufficient documentation

## 2015-05-25 DIAGNOSIS — F329 Major depressive disorder, single episode, unspecified: Secondary | ICD-10-CM | POA: Diagnosis not present

## 2015-05-25 DIAGNOSIS — F1910 Other psychoactive substance abuse, uncomplicated: Secondary | ICD-10-CM

## 2015-05-25 NOTE — ED Notes (Signed)
Pt requesting detox from xanax, percocet. Last used on Sunday. Denies SI or HI.
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
## 2015-05-25 NOTE — Discharge Instructions (Signed)
Polysubstance Abuse When people abuse more than one drug or type of drug it is called polysubstance or polydrug abuse. For example, many smokers also drink alcohol. This is one form of polydrug abuse. Polydrug abuse also refers to the use of a drug to counteract an unpleasant effect produced by another drug. It may also be used to help with withdrawal from another drug. People who take stimulants may become agitated. Sometimes this agitation is countered with a tranquilizer. This helps protect against the unpleasant side effects. Polydrug abuse also refers to the use of different drugs at the same time.  Anytime drug use is interfering with normal living activities, it has become abuse. This includes problems with family and friends. Psychological dependence has developed when your mind tells you that the drug is needed. This is usually followed by physical dependence which has developed when continuing increases of drug are required to get the same feeling or "high". This is known as addiction or chemical dependency. A person's risk is much higher if there is a history of chemical dependency in the family. SIGNS OF CHEMICAL DEPENDENCY  You have been told by friends or family that drugs have become a problem.  You fight when using drugs.  You are having blackouts (not remembering what you do while using).  You feel sick from using drugs but continue using.  You lie about use or amounts of drugs (chemicals) used.  You need chemicals to get you going.  You are suffering in work performance or in school because of drug use.  You get sick from use of drugs but continue to use anyway.  You need drugs to relate to people or feel comfortable in social situations.  You use drugs to forget problems. "Yes" answered to any of the above signs of chemical dependency indicates there are problems. The longer the use of drugs continues, the greater the problems will become. If there is a family history of  drug or alcohol use, it is best not to experiment with these drugs. Continual use leads to tolerance. After tolerance develops more of the drug is needed to get the same feeling. This is followed by addiction. With addiction, drugs become the most important part of life. It becomes more important to take drugs than participate in the other usual activities of life. This includes relating to friends and family. Addiction is followed by dependency. Dependency is a condition where drugs are now needed not just to get high, but to feel normal. Addiction cannot be cured but it can be stopped. This often requires outside help and the care of professionals. Treatment centers are listed in the yellow pages under: Cocaine, Narcotics, and Alcoholics Anonymous. Most hospitals and clinics can refer you to a specialized care center. Talk to your caregiver if you need help.   This information is not intended to replace advice given to you by your health care provider. Make sure you discuss any questions you have with your health care provider.   Follow up with the substance abuse treatment center and arrange for out patient rehabilitation program. Return to the ED if you experience seizures, CP, SOB, fever, loss of consciousness.

## 2015-05-27 ENCOUNTER — Emergency Department (HOSPITAL_COMMUNITY)
Admission: EM | Admit: 2015-05-27 | Discharge: 2015-05-27 | Discharge status: Against Medical Advice | Payer: Medicaid Other | Attending: Emergency Medicine | Admitting: Emergency Medicine

## 2015-05-27 ENCOUNTER — Encounter (HOSPITAL_COMMUNITY): Payer: Self-pay | Admitting: Emergency Medicine

## 2015-05-27 DIAGNOSIS — F1290 Cannabis use, unspecified, uncomplicated: Secondary | ICD-10-CM | POA: Insufficient documentation

## 2015-05-27 DIAGNOSIS — J45909 Unspecified asthma, uncomplicated: Secondary | ICD-10-CM | POA: Insufficient documentation

## 2015-05-27 DIAGNOSIS — F1590 Other stimulant use, unspecified, uncomplicated: Secondary | ICD-10-CM | POA: Diagnosis not present

## 2015-05-27 DIAGNOSIS — F1390 Sedative, hypnotic, or anxiolytic use, unspecified, uncomplicated: Secondary | ICD-10-CM | POA: Diagnosis not present

## 2015-05-27 DIAGNOSIS — F17210 Nicotine dependence, cigarettes, uncomplicated: Secondary | ICD-10-CM | POA: Insufficient documentation

## 2015-05-27 DIAGNOSIS — F1490 Cocaine use, unspecified, uncomplicated: Secondary | ICD-10-CM | POA: Insufficient documentation

## 2015-05-27 HISTORY — DX: Other psychoactive substance abuse, uncomplicated: F19.10

## 2015-05-27 NOTE — ED Notes (Signed)
Again tried to locate the patient - called for patient and unable to locate patient.

## 2015-05-27 NOTE — ED Notes (Signed)
Attempted to locate pt - pt called x3 attempts by staff.

## 2015-05-27 NOTE — ED Notes (Signed)
Pt. requesting detox from multiple drug use - Cocaine , Methamphetamine , Xanax and marijuana . Denies hallucinations / no suicidal ideation , seen here 2 days ago for the same complaint .

## 2015-05-28 ENCOUNTER — Encounter (HOSPITAL_COMMUNITY): Payer: Self-pay | Admitting: Emergency Medicine

## 2015-05-28 ENCOUNTER — Emergency Department (HOSPITAL_COMMUNITY)
Admission: EM | Admit: 2015-05-28 | Discharge: 2015-05-28 | Disposition: A | Discharge status: Home / Self Care | Payer: Medicaid Other | Attending: Emergency Medicine | Admitting: Emergency Medicine

## 2015-05-28 DIAGNOSIS — F1110 Opioid abuse, uncomplicated: Secondary | ICD-10-CM | POA: Diagnosis present

## 2015-05-28 DIAGNOSIS — Z87440 Personal history of urinary (tract) infections: Secondary | ICD-10-CM | POA: Diagnosis not present

## 2015-05-28 DIAGNOSIS — F1123 Opioid dependence with withdrawal: Secondary | ICD-10-CM | POA: Diagnosis not present

## 2015-05-28 DIAGNOSIS — F17210 Nicotine dependence, cigarettes, uncomplicated: Secondary | ICD-10-CM | POA: Insufficient documentation

## 2015-05-28 DIAGNOSIS — F329 Major depressive disorder, single episode, unspecified: Secondary | ICD-10-CM | POA: Insufficient documentation

## 2015-05-28 DIAGNOSIS — J45909 Unspecified asthma, uncomplicated: Secondary | ICD-10-CM | POA: Diagnosis not present

## 2015-05-28 DIAGNOSIS — Z880 Allergy status to penicillin: Secondary | ICD-10-CM | POA: Insufficient documentation

## 2015-05-28 DIAGNOSIS — F1193 Opioid use, unspecified with withdrawal: Secondary | ICD-10-CM

## 2015-05-28 MED ORDER — IBUPROFEN 800 MG PO TABS
800.0000 mg | ORAL_TABLET | Freq: Once | ORAL | Status: AC
  Administered 2015-05-28: 800 mg via ORAL
  Filled 2015-05-28: qty 1

## 2015-05-28 NOTE — ED Notes (Signed)
Reviewed d/c instructions with pt, who voiced understanding. Pt departed under her own power and in NAD.  
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
## 2015-05-28 NOTE — ED Notes (Signed)
Pt. requesting detox for her addiction to Cocaine , Methamphetamines and Xanax , denies hallucinations , no suicidal ideation .

## 2015-05-28 NOTE — Discharge Instructions (Signed)
Opioid Withdrawal Ms. Christella HartiganJacobs, you were seen today for withdrawal.  Contact the resources below for help getting care first thing tomorrow morning.  If any symptoms worsen, come back to the ED immediately.  Thank you. Opioids are a group of narcotic drugs. They include the street drug heroin. They also include pain medicines, such as morphine, hydrocodone, oxycodone, and fentanyl. Opioid withdrawal is a group of characteristic physical and mental signs and symptoms. It typically occurs if you have been using opioids daily for several weeks or longer and stop using or rapidly decrease use. Opioid withdrawal can also occur if you have used opioids daily for a long time and are given a medicine to block the effect.  SIGNS AND SYMPTOMS Opioid withdrawal includes three or more of the following symptoms:   Depressed, anxious, or irritable mood.  Nausea or vomiting.  Muscle aches or spasms.   Watery eyes.   Runny nose.  Dilated pupils, sweating, or hairs standing on end.  Diarrhea or intestinal cramping.  Yawning.   Fever.  Increased blood pressure.  Fast pulse.  Restlessness or trouble sleeping. These signs and symptoms occur within several hours of stopping or reducing short-acting opioids, such as heroin. They can occur within 3 days of stopping or reducing long-acting opioids, such as methadone. Withdrawal begins within minutes of receiving a drug that blocks the effects of opioids, such as naltrexone or naloxone. DIAGNOSIS  Opioid use disorder is diagnosed by your health care provider. You will be asked about your symptoms, drug and alcohol use, medical history, and use of medicines. A physical exam may be done. Lab tests may be ordered. Your health care provider may have you see a mental health professional.  TREATMENT  The treatment for opioid withdrawal is usually provided by medical doctors with special training in substance use disorders (addiction specialists). The following  medicines may be included in treatment:  Opioids given in place of the abused opioid. They turn on opioid receptors in the brain and lessen or prevent withdrawal symptoms. They are gradually decreased (opioid substitution and taper).  Non-opioids that can lessen certain opioid withdrawal symptoms. They may be used alone or with opioid substitution and taper. Successful long-term recovery usually requires medicine, counseling, and group support. HOME CARE INSTRUCTIONS   Take medicines only as directed by your health care provider.  Check with your health care provider before starting new medicines.  Keep all follow-up visits as directed by your health care provider. SEEK MEDICAL CARE IF:  You are not able to take your medicines as directed.  Your symptoms get worse.  You relapse. SEEK IMMEDIATE MEDICAL CARE IF:  You have serious thoughts about hurting yourself or others.  You have a seizure.  You lose consciousness.   This information is not intended to replace advice given to you by your health care provider. Make sure you discuss any questions you have with your health care provider.   Document Released: 06/01/2003 Document Revised: 06/19/2014 Document Reviewed: 06/11/2013 Elsevier Interactive Patient Education 2016 [employer] Substance Abuse Treatment Programs  Intensive Outpatient Programs [facility]     [address]      [place], [place]                   [phone]       [facility] [address] [place], [place] [phone]  [facility]     (Inpatient and outpatient)  [place] [phone] (Suboxone and Methadone)  [place], [place] [address]      [phone]       [phone] [place], [place]  [facility] (Outpatient/Inpatient, [employer])    (insurance only)  [phone]             [facility] ([place]) [place], [place]     [facility]     [address]     [place], [place]       [facility] (for caregivers and family) [phone] [address] Kristeen Mans. [place], [place]      Residential Treatment Programs [facility]      [address] [place], [place] [address]  [phone]       [facility] [address], [place], [place] [address] [phone]  [facility] [place]        [phone]       [facility] [phone]  [facility] ([place]             [place], [place] or [place], [address] [phone]  [facility]    [address]      [place], [place]       [facility] [address] [place], [place]  [place]   [address] [place], [place] [address]     [phone]      Admissions: 8am-3pm M-F  [facility] (RTS) [address] [place], [place]  [facility] Program: Residential Program [phone] Days)   [place], [place] or [phone]     ADATC: [facility] [place], [place] (Walk in Hours over the weekend or by referral)  [facility] [place], [place], [place] [address] [phone]  Crisis Mobile: [facility]:  [phone] (for crisis response 24 hours a day) [facility] Hotline:      [phone] Outpatient Psychiatry and Counseling  [facility]: Mobile Crisis Management 24 hours:  [phone]  [facility] of the [employer] sliding scale fee and walk in schedule: M-F  8am-12pm/1pm-3pm [place], [place] [address] [place], [place] [address] [phone]  [facility] ([facility] as [facility] [employer])- new patient walk-in appointments available Monday - Friday 8am -3pm.          [address] [place], [place] [address] [phone] or crisis [phone]  [facility] Outpatient Services/ Intensive Outpatient Therapy Program [place], [place] [address] [phone]  [place]      [phone] [address] [place], [place] [address]                 [place]   [facility] [phone]. [place], [place] [address]   [employer] of Care          [address] Johnette Abraham  [place], [place] [address]       [phone]  [facility] [address] [place], [place] [address] [phone]  [facility]    [address] [place], [place] [address]     [place], [place] Joycelyn Man     [place] [address]     [phone]       [facility] [place] [address]  [facility]     [address] [place], [place], [place], [place] [address] [place]     [address]     [place], [place] [address]     [phone]       [facility] [address] [place], [place] [address] [phone] Resources for Temporary Residential Assistance/Crisis [facility]) M-F 8am-3pm   [address] [place], [place] [address]   [phone] Services include: laundry, barbering, support  groups, case management, phone  & computer access, showers, AA/NA mtgs, mental health/substance abuse nurse, job skills class, disability information, VA assistance, spiritual classes, etc.   [facility]   [address], [place]     [place]              [employer] (women and children)       [place]. [place], [place] [address] [phone] [email or web address] for application and process Application Required  [facility]   [address]    [place] [address]     [phone]                    [place], [place] [address] [id number] application appt.) Application Required  [facility] (women only)    [address]     [place],  [address]     [phone]      Intake starts 6pm daily Need valid ID, SSC, & Police report [employer] [address] [place],  [id number] Application Required  [employer] (men only)     [place].      [place], [place]       [place] (Pregnant women only) [address] [place], [place]  [facility]  [address]      [place], [place] [address]     [phone]             [facility] [address] [place], [place] 90 day commitment/SA/Application process  [facility](men only)     [address]     [place], [place]       Check-in at [facility] [address] [place], [place] [address] [phone] Men/Women/Women and Children must be there by 7 pm  [place], [place]

## 2015-06-18 ENCOUNTER — Emergency Department (HOSPITAL_COMMUNITY): Payer: Medicaid Other

## 2015-06-18 ENCOUNTER — Encounter (HOSPITAL_COMMUNITY): Payer: Self-pay

## 2015-06-18 ENCOUNTER — Emergency Department (HOSPITAL_COMMUNITY)
Admission: EM | Admit: 2015-06-18 | Discharge: 2015-06-18 | Disposition: A | Discharge status: Home / Self Care | Payer: Medicaid Other | Attending: Emergency Medicine | Admitting: Emergency Medicine

## 2015-06-18 DIAGNOSIS — Z880 Allergy status to penicillin: Secondary | ICD-10-CM | POA: Insufficient documentation

## 2015-06-18 DIAGNOSIS — S199XXA Unspecified injury of neck, initial encounter: Secondary | ICD-10-CM | POA: Insufficient documentation

## 2015-06-18 DIAGNOSIS — Y9289 Other specified places as the place of occurrence of the external cause: Secondary | ICD-10-CM | POA: Insufficient documentation

## 2015-06-18 DIAGNOSIS — Y998 Other external cause status: Secondary | ICD-10-CM | POA: Insufficient documentation

## 2015-06-18 DIAGNOSIS — M79672 Pain in left foot: Secondary | ICD-10-CM

## 2015-06-18 DIAGNOSIS — S0990XA Unspecified injury of head, initial encounter: Secondary | ICD-10-CM | POA: Diagnosis not present

## 2015-06-18 DIAGNOSIS — J45909 Unspecified asthma, uncomplicated: Secondary | ICD-10-CM | POA: Insufficient documentation

## 2015-06-18 DIAGNOSIS — S99922A Unspecified injury of left foot, initial encounter: Secondary | ICD-10-CM | POA: Insufficient documentation

## 2015-06-18 DIAGNOSIS — F17210 Nicotine dependence, cigarettes, uncomplicated: Secondary | ICD-10-CM | POA: Insufficient documentation

## 2015-06-18 DIAGNOSIS — Z87440 Personal history of urinary (tract) infections: Secondary | ICD-10-CM | POA: Insufficient documentation

## 2015-06-18 DIAGNOSIS — Z8659 Personal history of other mental and behavioral disorders: Secondary | ICD-10-CM | POA: Diagnosis not present

## 2015-06-18 DIAGNOSIS — Y9389 Activity, other specified: Secondary | ICD-10-CM | POA: Insufficient documentation

## 2015-06-18 DIAGNOSIS — R519 Headache, unspecified: Secondary | ICD-10-CM

## 2015-06-18 DIAGNOSIS — M542 Cervicalgia: Secondary | ICD-10-CM

## 2015-06-18 DIAGNOSIS — R51 Headache: Secondary | ICD-10-CM

## 2015-06-18 MED ORDER — ACETAMINOPHEN 325 MG PO TABS
650.0000 mg | ORAL_TABLET | Freq: Once | ORAL | Status: AC
  Administered 2015-06-18: 650 mg via ORAL
  Filled 2015-06-18: qty 2
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
## 2015-06-18 NOTE — ED Notes (Signed)
Pt no longer in room.  

## 2015-09-10 ENCOUNTER — Emergency Department (HOSPITAL_COMMUNITY)
Admission: EM | Admit: 2015-09-10 | Discharge: 2015-09-10 | Disposition: A | Discharge status: Home / Self Care | Payer: Medicaid Other | Attending: Emergency Medicine | Admitting: Emergency Medicine

## 2015-09-10 ENCOUNTER — Encounter (HOSPITAL_COMMUNITY): Payer: Self-pay | Admitting: Emergency Medicine

## 2015-09-10 DIAGNOSIS — Y998 Other external cause status: Secondary | ICD-10-CM | POA: Diagnosis not present

## 2015-09-10 DIAGNOSIS — Z87440 Personal history of urinary (tract) infections: Secondary | ICD-10-CM | POA: Diagnosis not present

## 2015-09-10 DIAGNOSIS — Y9389 Activity, other specified: Secondary | ICD-10-CM | POA: Diagnosis not present

## 2015-09-10 DIAGNOSIS — T7840XA Allergy, unspecified, initial encounter: Secondary | ICD-10-CM

## 2015-09-10 DIAGNOSIS — L500 Allergic urticaria: Secondary | ICD-10-CM | POA: Insufficient documentation

## 2015-09-10 DIAGNOSIS — X58XXXA Exposure to other specified factors, initial encounter: Secondary | ICD-10-CM | POA: Insufficient documentation

## 2015-09-10 DIAGNOSIS — F17210 Nicotine dependence, cigarettes, uncomplicated: Secondary | ICD-10-CM | POA: Diagnosis not present

## 2015-09-10 DIAGNOSIS — Z8659 Personal history of other mental and behavioral disorders: Secondary | ICD-10-CM | POA: Insufficient documentation

## 2015-09-10 DIAGNOSIS — L509 Urticaria, unspecified: Secondary | ICD-10-CM

## 2015-09-10 DIAGNOSIS — J45909 Unspecified asthma, uncomplicated: Secondary | ICD-10-CM | POA: Diagnosis not present

## 2015-09-10 DIAGNOSIS — Y9289 Other specified places as the place of occurrence of the external cause: Secondary | ICD-10-CM | POA: Insufficient documentation

## 2015-09-10 DIAGNOSIS — Z880 Allergy status to penicillin: Secondary | ICD-10-CM | POA: Diagnosis not present

## 2015-09-10 MED ORDER — METHYLPREDNISOLONE SODIUM SUCC 125 MG IJ SOLR
125.0000 mg | Freq: Once | INTRAMUSCULAR | Status: AC
  Administered 2015-09-10: 125 mg via INTRAVENOUS
  Filled 2015-09-10: qty 2
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
## 2015-09-10 NOTE — ED Notes (Signed)
Per EMS pt reported to using cocaine and heroine today from 7am to 2pm. Pt reported to have hives to face and neck that developed after using heroine and cocaine. EMS administered 50mg  Benadryl IV through 20ga IV to right hand. Pt anxious at time of triage and tearful. Pt denies any thoughts of harm to self.

## 2015-09-10 NOTE — ED Notes (Signed)
Provider at bedside

## 2015-09-10 NOTE — Discharge Instructions (Signed)
Take benadryl  every 6hr for the next 48hr Take zantac once daily  [employer] Guide Outpatient Counseling/Substance Abuse Adult The [employer] Ways 211 is a great source of information about community services available.  Access by dialing 2-1-1 from anywhere in [place], or by website -  PooledIncome.pl.   Other Local Resources (Updated 06/2015)  Crisis Hotlines   Services     Area Served  [employer]  Crisis Hotline, available 24 hours a day, 7 days a week: 404-165-1942 [facility], [place]   [facility]  Crisis Hotline, available 24 hours a day, 7 days a week: (989)249-5715 [facility], [place]  [facility]  Suicide Prevention Hotline, available 24 hours a day, 7 days a week: 6400678492 [facility], [place]  [employer], available 24 hours a day, 7 days a week: 332-283-9472 [facility], [place]   [facility] Access to [employer], available 24 hours a day, 7 days a week: [phone] All   [facility]  Crisis Hotline, available 24 hours a day, 7 days a week: 202-777-3281 All   Other Local Resources (Updated 06/2015)  Outpatient Counseling/ Substance Abuse Programs  Services     Address and Phone Number  ADS (Alcohol and Drug Services)   Options include Individual counseling, group counseling, intensive outpatient program (several hours a day, several days a week)  Offers depression assessments  Provides methadone maintenance program [phone] [address] [place], [place] [address]   [facility]   Offers partial hospitalization/day treatment and DUI/DWI programs  [employer], private insurance [phone] [address] [place], [place] [address]  [facility]    Services include intensive outpatient program (several hours a day, several days a week), outpatient treatment, DUI/DWI services, family education  Also  has some services specifically for [employer] transitional housing  [phone] [address] [place], [place] [address]     [facility]  [employer], private pay, and private insurance [phone] [address] [place], [place] [address]  [employer] [facility]  Services include individual counseling, substance abuse intensive outpatient program (several hours a day, several days a week), day treatment  Delene Loll, Medicaid, private insurance [phone] [address], [address] [place], [place] [address]  [facility]   Offers substance abuse intensive outpatient program (several hours a day, several days a week), partial hospitalization program [phone] [address] [place], [place] [address]  [phone] [address] [place], [place] [address]  [phone] [address] [place], [place] [address]  [phone] [id number], [address] [place], [place] [address]  [facility]  Individual counseling only  Accepts private insurance only [phone] [address] [place], [place] [address]  [facility]  Methadone maintenance program [phone] [address] [place], [place] [address]  [facility]  Walk-In Clinic providing substance abuse and mental health counseling  Accepts Medicaid, Medicare, private insurance  Offers sliding scale for uninsured [phone] [address] [place], [place]   [facility] [place], [employer].  Offers individual counseling, and intensive in-home services [phone] [address] [place], [place] [address]  [facility] [employer] individual counseling, family counseling, group therapy, domestic violence counseling, consumer credit counseling  Accepts Medicare, Medicaid, private insurance  Offers sliding scale for uninsured [phone] [address] [place], [place] [address]  [phone] [facility],  [address] [place], [place] [address]  [facility]  Offers individual, family and group counseling  3 locations - [place], [place], and [place]  [phone] [phone]  [address] [place], [place] [address]  [address] [place], [place] [address]  [address] [place], [place] [address]  [facility]    Offers psychiatric assessment, 8-week Intensive Outpatient Program (several hours a day, several times a week, daytime or evenings), early recovery group, family Program, medication management  Private pay or private insurance only [phone], or  [phone] [address] [place], [place] [address]  [facility]  Offers individual, couples and family counseling  Accepts Medicaid, private insurance, and sliding scale for uninsured [phone] [address] [place], [place] [address]  Len Blalockavid Fuller, MD  Individual counseling  Private insurance [phone] [address] [place], [place] [address]  [facility]   Offers assessment, substance abuse treatment, and behavioral health treatment [phone] [address] [place], [place] [address]  [facility]  Individual counseling  Accepts private insurance [phone] [address] [place], [place] [address]  [facility]  Individual counseling  Delene Lollccepts Medicare, private insurance [phone] [address] [place], [place] [address]  [facility]    Offers intensive outpatient program (several hours a day, several times a week)  Private pay, private insurance [phone] [facility] [place], [place]  [facility]  Individual counseling  Medicare, private insurance [phone] [address], [address] [place], [place] [address]  [facility]    Offers intensive outpatient  program (several hours a day, several times a week) and partial hospitalization program [phone] [address] [place], [place] [address]  Emerson MonteParrish McKinney, MD  Individual counseling [phone] [address], [address] [place], [place] [address]  [facility]  Offers Christian counseling to individuals, couples, and families  Accepts Medicare and private insurance; offers sliding scale for uninsured [phone] [address] [place], [place] [address]  [facility]  [facility] [phone] [address], [address] [place], [place] [address]  [facility]   Offers crisis counseling, individual counseling, group therapy, in-home therapy, domestic violence services, day treatment, DWI services, Administrator, artsCommunity Support Team (CST), Assertive Community Treatment Team (ACTT), substance abuse Intensive Outpatient Program (several hours a day, several times a week)  2 locations - [place] and [place] [phone] [address] [place], [place] [address]  [phone] [address] [place], [place] [address]  [facility]     Individual counseling and group therapy  Accepts private insurance, [place]icare, [place]icaid [phone] [address] [place], [place]  [facility]  Offers individual and family counseling  Offers LGBTQ services  Accepts private insurance and private pay [phone] [address] [place], [place] [address]  [facility]    Offers individual counseling, group therapy, and outpatient detox  Accepts private insurance [phone] [address] [place], [place]  [facility]  Individual counseling  Accepts Medicare, private insurance [phone] [address] [place], [place] [address]  [facility]  Individual counseling  Accepts Medicare, private insurance [phone] [address] [place], [place] [address]  [facility]   Offers substance abuse Intensive Outpatient Program (several hours a day, several times a week) [phone], or [phone] [place], [place]

## 2015-09-10 NOTE — ED Notes (Signed)
Pt wanted a tax voucher something we are unable to provide,I have offered a bus pass, pt has declined, stated she will call for a ride.
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
## 2015-09-10 NOTE — ED Notes (Addendum)
Pt reports using heroine and cocaine today from 0700 to 1400 and now is having hives and itching to face,neck, chest, and abdomen. EMS administered 50mg  Benadryl IVP. PT anxious at time of examination but able to follow commands. Nausea also reported by pt. No respiratory distress noted at this time.

## 2015-09-10 NOTE — ED Notes (Signed)
Bed: [facility]D Expected date:  Expected time:  Means of arrival:  Comments: 

## 2015-09-13 ENCOUNTER — Telehealth: Payer: Self-pay

## 2015-09-13 NOTE — Telephone Encounter (Signed)
Message received from Marval RegalKim Gibbs, RN CM requesting a hospital follow up appointment for the patient. Calls placed to # [phone] (H) [phone] (M) and HIPAA compliant voice mail messages were left requesting a call back to # [phone] or [phone].   Update provided to K. Noelle PennerGibbs, RN CM.

## 2015-09-14 ENCOUNTER — Telehealth: Payer: Self-pay

## 2015-09-14 NOTE — Telephone Encounter (Signed)
Attempted again to contact the patient to discuss follow up medical care. Call placed to # [phone] (H) # [phone] (M) and HIPAA compliant voice mail messages left requesting a call back to # [phone] or [phone].

## 2015-09-15 ENCOUNTER — Telehealth: Payer: Self-pay

## 2015-09-15 NOTE — Telephone Encounter (Signed)
Attempted to contact the patient again to discuss follow up medical care. Call placed to # [phone] (H) and a HIPAA compliant voice mail message was left requesting a call back to # [phone] or [phone].  Call then placed to  # [phone] (M) and this CM was informed that the patient does not live there any longer.

## 2015-09-23 ENCOUNTER — Encounter (HOSPITAL_COMMUNITY): Payer: Self-pay | Admitting: *Deleted

## 2015-09-23 ENCOUNTER — Emergency Department (HOSPITAL_COMMUNITY)
Admission: EM | Admit: 2015-09-23 | Discharge: 2015-09-24 | Payer: Medicaid Other | Attending: Emergency Medicine | Admitting: Emergency Medicine

## 2015-09-23 DIAGNOSIS — J45909 Unspecified asthma, uncomplicated: Secondary | ICD-10-CM | POA: Insufficient documentation

## 2015-09-23 DIAGNOSIS — F1310 Sedative, hypnotic or anxiolytic abuse, uncomplicated: Secondary | ICD-10-CM | POA: Insufficient documentation

## 2015-09-23 DIAGNOSIS — F329 Major depressive disorder, single episode, unspecified: Secondary | ICD-10-CM | POA: Insufficient documentation

## 2015-09-23 DIAGNOSIS — F1210 Cannabis abuse, uncomplicated: Secondary | ICD-10-CM | POA: Diagnosis not present

## 2015-09-23 DIAGNOSIS — R45851 Suicidal ideations: Secondary | ICD-10-CM

## 2015-09-23 DIAGNOSIS — Z87440 Personal history of urinary (tract) infections: Secondary | ICD-10-CM | POA: Diagnosis not present

## 2015-09-23 DIAGNOSIS — F419 Anxiety disorder, unspecified: Secondary | ICD-10-CM | POA: Diagnosis not present

## 2015-09-23 DIAGNOSIS — F333 Major depressive disorder, recurrent, severe with psychotic symptoms: Secondary | ICD-10-CM | POA: Diagnosis not present

## 2015-09-23 DIAGNOSIS — F17210 Nicotine dependence, cigarettes, uncomplicated: Secondary | ICD-10-CM | POA: Insufficient documentation

## 2015-09-23 DIAGNOSIS — F1410 Cocaine abuse, uncomplicated: Secondary | ICD-10-CM | POA: Diagnosis not present

## 2015-09-23 DIAGNOSIS — Z880 Allergy status to penicillin: Secondary | ICD-10-CM | POA: Insufficient documentation

## 2015-09-23 DIAGNOSIS — F1110 Opioid abuse, uncomplicated: Secondary | ICD-10-CM | POA: Insufficient documentation

## 2015-09-23 DIAGNOSIS — F1120 Opioid dependence, uncomplicated: Secondary | ICD-10-CM | POA: Diagnosis present

## 2015-09-23 DIAGNOSIS — F11222 Opioid dependence with intoxication with perceptual disturbance: Secondary | ICD-10-CM | POA: Diagnosis not present

## 2015-09-23 DIAGNOSIS — F1910 Other psychoactive substance abuse, uncomplicated: Secondary | ICD-10-CM

## 2015-09-23 LAB — ACETAMINOPHEN LEVEL: Acetaminophen (Tylenol), Serum: <10 ug/mL — ABNORMAL LOW (ref 10–30)

## 2015-09-23 LAB — COMPREHENSIVE METABOLIC PANEL
ALBUMIN: 4.6 g/dL (ref 3.5–5.0)
ALK PHOS: 53 U/L (ref 38–126)
ALT: 16 U/L (ref 14–54)
AST: 21 U/L (ref 15–41)
Anion gap: 9 (ref 5–15)
BILIRUBIN TOTAL: 0.5 mg/dL (ref 0.3–1.2)
BUN: 11 mg/dL (ref 6–20)
CALCIUM: 9.7 mg/dL (ref 8.9–10.3)
CO2: 26 mmol/L (ref 22–32)
Chloride: 103 mmol/L (ref 101–111)
Creatinine, Ser: 0.86 mg/dL (ref 0.44–1.00)
GFR calc Af Amer: >60 mL/min (ref >60)
GFR calc non Af Amer: >60 mL/min (ref >60)
GLUCOSE: 100 mg/dL — AB (ref 65–99)
Potassium: 3.7 mmol/L (ref 3.5–5.1)
Sodium: 138 mmol/L (ref 135–145)
TOTAL PROTEIN: 7.8 g/dL (ref 6.5–8.1)

## 2015-09-23 LAB — CBC
HCT: 43.9 % (ref 36.0–46.0)
Hemoglobin: 15.5 g/dL — ABNORMAL HIGH (ref 12.0–15.0)
MCH: 32 pg (ref 26.0–34.0)
MCHC: 35.3 g/dL (ref 30.0–36.0)
MCV: 90.5 fL (ref 78.0–100.0)
PLATELETS: 349 10*3/uL (ref 150–400)
RBC: 4.85 MIL/uL (ref 3.87–5.11)
RDW: 12.6 % (ref 11.5–15.5)
WBC: 12.5 10*3/uL — AB (ref 4.0–10.5)

## 2015-09-23 LAB — RAPID URINE DRUG SCREEN, HOSP PERFORMED
Amphetamines: NOT DETECTED
BARBITURATES: NOT DETECTED
BENZODIAZEPINES: POSITIVE — AB
Cocaine: POSITIVE — AB
Opiates: POSITIVE — AB
Tetrahydrocannabinol: POSITIVE — AB

## 2015-09-23 LAB — ETHANOL: Alcohol, Ethyl (B): <5 mg/dL (ref <5)

## 2015-09-23 LAB — SALICYLATE LEVEL: Salicylate Lvl: <4 mg/dL (ref 2.8–30.0)

## 2015-09-23 MED ORDER — IBUPROFEN 200 MG PO TABS
600.0000 mg | ORAL_TABLET | Freq: Three times a day (TID) | ORAL | Status: DC | PRN
  Administered 2015-09-23: 600 mg via ORAL
  Filled 2015-09-23: qty 3

## 2015-09-23 MED ORDER — NAPROXEN 500 MG PO TABS
500.0000 mg | ORAL_TABLET | Freq: Two times a day (BID) | ORAL | Status: DC | PRN
Start: 2015-09-23 — End: 2015-09-24
  Administered 2015-09-23 – 2015-09-24 (×2): 500 mg via ORAL
  Filled 2015-09-23 (×2): qty 1

## 2015-09-23 MED ORDER — HYDROXYZINE HCL 25 MG PO TABS
25.0000 mg | ORAL_TABLET | Freq: Four times a day (QID) | ORAL | Status: DC | PRN
  Administered 2015-09-23 – 2015-09-24 (×2): 25 mg via ORAL
  Filled 2015-09-23 (×2): qty 1

## 2015-09-23 MED ORDER — METHOCARBAMOL 500 MG PO TABS
500.0000 mg | ORAL_TABLET | Freq: Three times a day (TID) | ORAL | Status: DC | PRN
  Administered 2015-09-23: 500 mg via ORAL
  Filled 2015-09-23: qty 1

## 2015-09-23 MED ORDER — CLONIDINE HCL 0.1 MG PO TABS
0.1000 mg | ORAL_TABLET | Freq: Four times a day (QID) | ORAL | Status: DC
  Administered 2015-09-23 (×3): 0.1 mg via ORAL
  Filled 2015-09-23 (×3): qty 1

## 2015-09-23 MED ORDER — ONDANSETRON HCL 4 MG PO TABS: 4.0000 mg | ORAL_TABLET | Freq: Three times a day (TID) | ORAL | Status: DC | PRN

## 2015-09-23 MED ORDER — CLONIDINE HCL 0.1 MG PO TABS: 0.1000 mg | ORAL_TABLET | Freq: Every day | ORAL | Status: DC

## 2015-09-23 MED ORDER — CLONIDINE HCL 0.1 MG PO TABS: 0.1000 mg | ORAL_TABLET | ORAL | Status: DC

## 2015-09-23 MED ORDER — DICYCLOMINE HCL 20 MG PO TABS
20.0000 mg | ORAL_TABLET | Freq: Four times a day (QID) | ORAL | Status: DC | PRN
  Administered 2015-09-23: 20 mg via ORAL
  Filled 2015-09-23: qty 1

## 2015-09-23 MED ORDER — NICOTINE 21 MG/24HR TD PT24
21.0000 mg | MEDICATED_PATCH | Freq: Every day | TRANSDERMAL | Status: DC
  Administered 2015-09-23: 21 mg via TRANSDERMAL
  Filled 2015-09-23: qty 1

## 2015-09-23 MED ORDER — LOPERAMIDE HCL 2 MG PO CAPS: 2.0000 mg | ORAL_CAPSULE | ORAL | Status: DC | PRN

## 2015-09-23 MED ORDER — LORAZEPAM 1 MG PO TABS
1.0000 mg | ORAL_TABLET | Freq: Three times a day (TID) | ORAL | Status: DC | PRN
  Administered 2015-09-23 (×2): 1 mg via ORAL
  Filled 2015-09-23 (×2): qty 1

## 2015-09-23 MED ORDER — ONDANSETRON 4 MG PO TBDP
4.0000 mg | ORAL_TABLET | Freq: Four times a day (QID) | ORAL | Status: DC | PRN
  Administered 2015-09-23: 4 mg via ORAL
  Filled 2015-09-23: qty 1
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
## 2015-09-23 NOTE — Progress Notes (Signed)
Pt seen by ED Cm after noting medicaid [place]  [place]
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
## 2015-09-23 NOTE — ED Notes (Addendum)
Pt states she has had suicidal ideation for the past week. Pt states she would overdose on heroin and/or xanax. Pt states she was admitted to hospital last year for overdose on heroin and xanax. Pt states she is not on medication for depression, feels depression has gotten worse over the past month. Pt denies A/V hallucinations at this time, states she had drug induced hallucination last week lasting several days, denies AV hallucination or HI at this time. Pt states she used cocaine and heroin 2 days ago.

## 2015-09-23 NOTE — BH Assessment (Signed)
Per Julieanne CottonJosephine, NP - patient meets criteria for inpatient hospitalization.  TTS will seek placement.  [facility]

## 2015-09-23 NOTE — ED Notes (Signed)
Charlotte Strong is positive for SI with a plan to OD on heroin. She has been admitted previously for attempting to OD on heroin and Xanax. She denies HI and AH but [facility]. She was pleasant and cooperative. Pupils dilated, itching noted with sores present, some loose stools reported.

## 2015-09-23 NOTE — Progress Notes (Signed)
Patient presents to ED with SI, planning to OD on heroine.  Patient listed as having Medicaid [place] Access insurance.  [facility]'s [facility] card [facility]

## 2015-09-24 DIAGNOSIS — F11222 Opioid dependence with intoxication with perceptual disturbance: Secondary | ICD-10-CM | POA: Diagnosis not present

## 2015-09-24 DIAGNOSIS — F333 Major depressive disorder, recurrent, severe with psychotic symptoms: Secondary | ICD-10-CM | POA: Diagnosis not present

## 2015-09-24 DIAGNOSIS — F1910 Other psychoactive substance abuse, uncomplicated: Secondary | ICD-10-CM | POA: Diagnosis not present

## 2015-09-24 DIAGNOSIS — F1120 Opioid dependence, uncomplicated: Secondary | ICD-10-CM | POA: Diagnosis present

## 2015-09-24 DIAGNOSIS — R45851 Suicidal ideations: Secondary | ICD-10-CM | POA: Insufficient documentation

## 2015-09-24 NOTE — ED Notes (Signed)
Patient admits SI with a plan to OD on heroin. Patient denies HI and AVH at this time. Plan of care discussed with patient. Patient voices no complaints or concerns at this time. Encouragement and support provided and safety maintain. Q 15 min safety checks remain in place.

## 2015-09-24 NOTE — ED Notes (Signed)
Pt discharged ambulatory with [facility].  All belongings were sent with patient.  Pt was calm and cooperative and in no distress.

## 2015-09-24 NOTE — BHH Counselor (Signed)
This Clinical research associatewriter faxed supporting documentation to the following facilities for pt placement: [facility]  [facility] [place]
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
## 2015-09-24 NOTE — BH Assessment (Addendum)
[facility] Assessment Progress Note  Per Thedore MinsMojeed Akintayo, MD, this pt continues to require psychiatric hospitalization.  Albin FellingCarla calls from [facility] to notify me that pt has been accepted to their facility by Dr Jeannine KittenFarah after 14:30 today, contingent upon pt being placed under IVC.  Please call report to [phone].  Dr Jannifer FranklinAkintayo concurs with this decision and has initiated IVC.  Documents [place] fax[place] to [place].  Pt's nurse, Kendal Hymendie, has been notified.  Doylene Canninghomas Mordechai Matuszak, MA Triage Specialist [phone]   Addendum:  [place], and at 13:32 Betsy CoderMagistrate Borre confirms receipt.  Service of Findings and Custody Order is pending at this time.  Doylene Canninghomas Rhylan Kagel, MA Triage Specialist [phone]
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
## 2016-08-21 ENCOUNTER — Emergency Department (HOSPITAL_COMMUNITY)
Admission: EM | Admit: 2016-08-21 | Discharge: 2016-08-22 | Disposition: A | Discharge status: Home / Self Care | Payer: Self-pay | Attending: Emergency Medicine | Admitting: Emergency Medicine

## 2016-08-21 ENCOUNTER — Encounter (HOSPITAL_COMMUNITY): Payer: Self-pay | Admitting: Emergency Medicine

## 2016-08-21 DIAGNOSIS — Z79899 Other long term (current) drug therapy: Secondary | ICD-10-CM | POA: Insufficient documentation

## 2016-08-21 DIAGNOSIS — F17210 Nicotine dependence, cigarettes, uncomplicated: Secondary | ICD-10-CM | POA: Insufficient documentation

## 2016-08-21 DIAGNOSIS — J45909 Unspecified asthma, uncomplicated: Secondary | ICD-10-CM | POA: Insufficient documentation

## 2016-08-21 DIAGNOSIS — T40601A Poisoning by unspecified narcotics, accidental (unintentional), initial encounter: Secondary | ICD-10-CM

## 2016-08-21 DIAGNOSIS — T401X1A Poisoning by heroin, accidental (unintentional), initial encounter: Secondary | ICD-10-CM | POA: Insufficient documentation

## 2016-08-21 LAB — CBC WITH DIFFERENTIAL/PLATELET
BASOS ABS: 0.1 10*3/uL (ref 0.0–0.1)
BASOS PCT: 1 %
Eosinophils Absolute: 0.1 10*3/uL (ref 0.0–0.7)
Eosinophils Relative: 1 %
HEMATOCRIT: 38.4 % (ref 36.0–46.0)
Hemoglobin: 13.6 g/dL (ref 12.0–15.0)
Lymphocytes Relative: 16 %
Lymphs Abs: 1.7 10*3/uL (ref 0.7–4.0)
MCH: 32.2 pg (ref 26.0–34.0)
MCHC: 35.4 g/dL (ref 30.0–36.0)
MCV: 90.8 fL (ref 78.0–100.0)
MONO ABS: 1 10*3/uL (ref 0.1–1.0)
Monocytes Relative: 9 %
NEUTROS ABS: 7.9 10*3/uL — AB (ref 1.7–7.7)
NEUTROS PCT: 73 %
Platelets: 246 10*3/uL (ref 150–400)
RBC: 4.23 MIL/uL (ref 3.87–5.11)
RDW: 13.1 % (ref 11.5–15.5)
WBC: 10.6 10*3/uL — ABNORMAL HIGH (ref 4.0–10.5)

## 2016-08-21 LAB — COMPREHENSIVE METABOLIC PANEL
ALBUMIN: 4.2 g/dL (ref 3.5–5.0)
ALT: 23 U/L (ref 14–54)
AST: 23 U/L (ref 15–41)
Alkaline Phosphatase: 40 U/L (ref 38–126)
Anion gap: 6 (ref 5–15)
BILIRUBIN TOTAL: 1 mg/dL (ref 0.3–1.2)
BUN: 13 mg/dL (ref 6–20)
CHLORIDE: 103 mmol/L (ref 101–111)
CO2: 29 mmol/L (ref 22–32)
Calcium: 9.3 mg/dL (ref 8.9–10.3)
Creatinine, Ser: 0.7 mg/dL (ref 0.44–1.00)
GFR calc Af Amer: >60 mL/min (ref >60)
GFR calc non Af Amer: >60 mL/min (ref >60)
GLUCOSE: 71 mg/dL (ref 65–99)
Potassium: 3.3 mmol/L — ABNORMAL LOW (ref 3.5–5.1)
SODIUM: 138 mmol/L (ref 135–145)
TOTAL PROTEIN: 7 g/dL (ref 6.5–8.1)

## 2016-08-21 LAB — SALICYLATE LEVEL: Salicylate Lvl: <7 mg/dL (ref 2.8–30.0)

## 2016-08-21 LAB — ETHANOL: Alcohol, Ethyl (B): <5 mg/dL (ref <5)

## 2016-08-21 LAB — ACETAMINOPHEN LEVEL: Acetaminophen (Tylenol), Serum: <10 ug/mL — ABNORMAL LOW (ref 10–30)

## 2016-08-21 LAB — I-STAT BETA HCG BLOOD, ED (MC, WL, AP ONLY)

## 2016-08-21 MED ORDER — SODIUM CHLORIDE 0.9 % IV BOLUS (SEPSIS)
1000.0000 mL | Freq: Once | INTRAVENOUS | Status: AC
  Administered 2016-08-21: 1000 mL via INTRAVENOUS
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
## 2016-08-21 NOTE — ED Triage Notes (Signed)
Per EMS pt overdosed on heroin. Pt states the heroin had fentanyl in it. Pt received 4mg  narcan from EMS prior to arrival. Pt denies using any other drugs or alcohol tonight. Pt has hx of heroin abuse but states she has not used in 2 years. Pt now A&O x4 speaking in complete sentences.  20g in R hand placed by EMS 126/84 99%RA  CBG 219

## 2016-08-21 NOTE — ED Notes (Signed)
Bed: [id number] Expected date:  Expected time:  Means of arrival:  Comments: 41 yo heroin OD, narcan given

## 2017-02-28 ENCOUNTER — Encounter (HOSPITAL_BASED_OUTPATIENT_CLINIC_OR_DEPARTMENT_OTHER): Payer: Self-pay | Admitting: Emergency Medicine

## 2017-02-28 ENCOUNTER — Emergency Department (HOSPITAL_BASED_OUTPATIENT_CLINIC_OR_DEPARTMENT_OTHER)
Admission: EM | Admit: 2017-02-28 | Discharge: 2017-02-28 | Disposition: A | Discharge status: Home / Self Care | Payer: Self-pay | Attending: Emergency Medicine | Admitting: Emergency Medicine

## 2017-02-28 DIAGNOSIS — R0789 Other chest pain: Secondary | ICD-10-CM | POA: Insufficient documentation

## 2017-02-28 DIAGNOSIS — F1910 Other psychoactive substance abuse, uncomplicated: Secondary | ICD-10-CM | POA: Insufficient documentation

## 2017-02-28 DIAGNOSIS — G8929 Other chronic pain: Secondary | ICD-10-CM | POA: Insufficient documentation

## 2017-02-28 DIAGNOSIS — F17210 Nicotine dependence, cigarettes, uncomplicated: Secondary | ICD-10-CM | POA: Insufficient documentation

## 2017-02-28 DIAGNOSIS — J9801 Acute bronchospasm: Secondary | ICD-10-CM | POA: Insufficient documentation

## 2017-02-28 HISTORY — DX: Opioid abuse, uncomplicated: F11.10

## 2017-02-28 LAB — PREGNANCY, URINE: PREG TEST UR: NEGATIVE

## 2017-02-28 LAB — RAPID URINE DRUG SCREEN, HOSP PERFORMED
Amphetamines: NOT DETECTED
BENZODIAZEPINES: POSITIVE — AB
Barbiturates: NOT DETECTED
COCAINE: POSITIVE — AB
Opiates: NOT DETECTED
Tetrahydrocannabinol: POSITIVE — AB

## 2017-02-28 MED ORDER — ALBUTEROL SULFATE HFA 108 (90 BASE) MCG/ACT IN AERS
2.0000 | INHALATION_SPRAY | RESPIRATORY_TRACT | Status: DC | PRN
  Administered 2017-02-28: 2 via RESPIRATORY_TRACT
  Filled 2017-02-28: qty 6.7

## 2017-02-28 MED ORDER — NAPROXEN 375 MG PO TABS: ORAL_TABLET | ORAL | 0 refills | Status: DC

## 2017-02-28 MED ORDER — NAPROXEN 250 MG PO TABS
500.0000 mg | ORAL_TABLET | Freq: Once | ORAL | Status: AC
  Administered 2017-02-28: 500 mg via ORAL
  Filled 2017-02-28: qty 2

## 2017-02-28 NOTE — ED Triage Notes (Signed)
Pt presents with chest pain for the past month. Pt reports it is left side of chest and back and she is unable to lie on that side and hurts on the left side of her head at times.

## 2017-02-28 NOTE — ED Notes (Signed)
Pt ambulated to restroom without difficulty
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
## 2017-03-15 ENCOUNTER — Encounter (HOSPITAL_COMMUNITY): Payer: Self-pay | Admitting: Emergency Medicine

## 2017-03-15 ENCOUNTER — Inpatient Hospital Stay (HOSPITAL_COMMUNITY)
Admission: AD | Admit: 2017-03-15 | Discharge: 2017-03-23 | DRG: 885 | Disposition: A | Discharge status: Home / Self Care | Payer: Federal, State, Local not specified - Other | Source: Intra-hospital | Attending: Psychiatry | Admitting: Psychiatry

## 2017-03-15 ENCOUNTER — Encounter (HOSPITAL_COMMUNITY): Payer: Self-pay

## 2017-03-15 ENCOUNTER — Emergency Department (HOSPITAL_COMMUNITY)
Admission: EM | Admit: 2017-03-15 | Discharge: 2017-03-15 | Disposition: A | Discharge status: Other Acute Inpatient Hospital | Payer: Self-pay | Attending: Emergency Medicine | Admitting: Emergency Medicine

## 2017-03-15 DIAGNOSIS — F1994 Other psychoactive substance use, unspecified with psychoactive substance-induced mood disorder: Secondary | ICD-10-CM | POA: Diagnosis present

## 2017-03-15 DIAGNOSIS — F17210 Nicotine dependence, cigarettes, uncomplicated: Secondary | ICD-10-CM | POA: Insufficient documentation

## 2017-03-15 DIAGNOSIS — F419 Anxiety disorder, unspecified: Secondary | ICD-10-CM | POA: Insufficient documentation

## 2017-03-15 DIAGNOSIS — F1410 Cocaine abuse, uncomplicated: Secondary | ICD-10-CM | POA: Insufficient documentation

## 2017-03-15 DIAGNOSIS — Z046 Encounter for general psychiatric examination, requested by authority: Secondary | ICD-10-CM | POA: Insufficient documentation

## 2017-03-15 DIAGNOSIS — Z590 Homelessness: Secondary | ICD-10-CM | POA: Diagnosis not present

## 2017-03-15 DIAGNOSIS — F333 Major depressive disorder, recurrent, severe with psychotic symptoms: Secondary | ICD-10-CM | POA: Diagnosis present

## 2017-03-15 DIAGNOSIS — Z560 Unemployment, unspecified: Secondary | ICD-10-CM | POA: Diagnosis not present

## 2017-03-15 DIAGNOSIS — Z818 Family history of other mental and behavioral disorders: Secondary | ICD-10-CM | POA: Diagnosis not present

## 2017-03-15 DIAGNOSIS — T1491XA Suicide attempt, initial encounter: Secondary | ICD-10-CM | POA: Diagnosis not present

## 2017-03-15 DIAGNOSIS — Z813 Family history of other psychoactive substance abuse and dependence: Secondary | ICD-10-CM

## 2017-03-15 DIAGNOSIS — T424X2A Poisoning by benzodiazepines, intentional self-harm, initial encounter: Secondary | ICD-10-CM | POA: Diagnosis not present

## 2017-03-15 DIAGNOSIS — F1920 Other psychoactive substance dependence, uncomplicated: Secondary | ICD-10-CM | POA: Diagnosis not present

## 2017-03-15 DIAGNOSIS — G479 Sleep disorder, unspecified: Secondary | ICD-10-CM | POA: Insufficient documentation

## 2017-03-15 DIAGNOSIS — F1210 Cannabis abuse, uncomplicated: Secondary | ICD-10-CM | POA: Diagnosis not present

## 2017-03-15 DIAGNOSIS — F332 Major depressive disorder, recurrent severe without psychotic features: Principal | ICD-10-CM | POA: Diagnosis present

## 2017-03-15 DIAGNOSIS — R45851 Suicidal ideations: Secondary | ICD-10-CM | POA: Insufficient documentation

## 2017-03-15 DIAGNOSIS — J45909 Unspecified asthma, uncomplicated: Secondary | ICD-10-CM | POA: Insufficient documentation

## 2017-03-15 DIAGNOSIS — G4700 Insomnia, unspecified: Secondary | ICD-10-CM | POA: Diagnosis present

## 2017-03-15 DIAGNOSIS — Z23 Encounter for immunization: Secondary | ICD-10-CM

## 2017-03-15 DIAGNOSIS — R634 Abnormal weight loss: Secondary | ICD-10-CM | POA: Diagnosis present

## 2017-03-15 DIAGNOSIS — F11222 Opioid dependence with intoxication with perceptual disturbance: Secondary | ICD-10-CM | POA: Diagnosis not present

## 2017-03-15 LAB — COMPREHENSIVE METABOLIC PANEL
ALBUMIN: 4.5 g/dL (ref 3.5–5.0)
ALK PHOS: 61 U/L (ref 38–126)
ALT: 42 U/L (ref 14–54)
AST: 29 U/L (ref 15–41)
Anion gap: 10 (ref 5–15)
BUN: 14 mg/dL (ref 6–20)
CALCIUM: 9.9 mg/dL (ref 8.9–10.3)
CO2: 29 mmol/L (ref 22–32)
CREATININE: 0.78 mg/dL (ref 0.44–1.00)
Chloride: 103 mmol/L (ref 101–111)
GFR calc Af Amer: >60 mL/min (ref >60)
GFR calc non Af Amer: >60 mL/min (ref >60)
GLUCOSE: 109 mg/dL — AB (ref 65–99)
Potassium: 3.9 mmol/L (ref 3.5–5.1)
SODIUM: 142 mmol/L (ref 135–145)
Total Bilirubin: 0.9 mg/dL (ref 0.3–1.2)
Total Protein: 8.5 g/dL — ABNORMAL HIGH (ref 6.5–8.1)

## 2017-03-15 LAB — RAPID URINE DRUG SCREEN, HOSP PERFORMED
Amphetamines: NOT DETECTED
BARBITURATES: NOT DETECTED
Benzodiazepines: POSITIVE — AB
COCAINE: POSITIVE — AB
OPIATES: NOT DETECTED
Tetrahydrocannabinol: POSITIVE — AB

## 2017-03-15 LAB — CBC
HEMATOCRIT: 47.2 % — AB (ref 36.0–46.0)
HEMOGLOBIN: 16.6 g/dL — AB (ref 12.0–15.0)
MCH: 33.5 pg (ref 26.0–34.0)
MCHC: 35.2 g/dL (ref 30.0–36.0)
MCV: 95.2 fL (ref 78.0–100.0)
Platelets: 338 10*3/uL (ref 150–400)
RBC: 4.96 MIL/uL (ref 3.87–5.11)
RDW: 13.3 % (ref 11.5–15.5)
WBC: 8 10*3/uL (ref 4.0–10.5)

## 2017-03-15 LAB — PREGNANCY, URINE: Preg Test, Ur: NEGATIVE

## 2017-03-15 LAB — ACETAMINOPHEN LEVEL: Acetaminophen (Tylenol), Serum: <10 ug/mL — ABNORMAL LOW (ref 10–30)

## 2017-03-15 LAB — SALICYLATE LEVEL: Salicylate Lvl: <7 mg/dL (ref 2.8–30.0)

## 2017-03-15 LAB — ETHANOL: Alcohol, Ethyl (B): <10 mg/dL (ref <10)

## 2017-03-15 MED ORDER — CHLORDIAZEPOXIDE HCL 25 MG PO CAPS: 25.0000 mg | ORAL_CAPSULE | Freq: Four times a day (QID) | ORAL | Status: AC | PRN

## 2017-03-15 MED ORDER — CLONIDINE HCL 0.1 MG PO TABS
0.1000 mg | ORAL_TABLET | Freq: Four times a day (QID) | ORAL | Status: DC
  Administered 2017-03-15 – 2017-03-16 (×3): 0.1 mg via ORAL
  Filled 2017-03-15 (×10): qty 1

## 2017-03-15 MED ORDER — ACETAMINOPHEN 325 MG PO TABS
650.0000 mg | ORAL_TABLET | Freq: Four times a day (QID) | ORAL | Status: DC | PRN
  Administered 2017-03-15 – 2017-03-21 (×2): 650 mg via ORAL
  Filled 2017-03-15 (×2): qty 2

## 2017-03-15 MED ORDER — CHLORDIAZEPOXIDE HCL 25 MG PO CAPS
25.0000 mg | ORAL_CAPSULE | Freq: Four times a day (QID) | ORAL | Status: AC
  Administered 2017-03-15 – 2017-03-16 (×4): 25 mg via ORAL
  Filled 2017-03-15 (×4): qty 1

## 2017-03-15 MED ORDER — NAPROXEN 500 MG PO TABS
500.0000 mg | ORAL_TABLET | Freq: Two times a day (BID) | ORAL | Status: AC | PRN
  Administered 2017-03-17: 500 mg via ORAL
  Filled 2017-03-15: qty 1

## 2017-03-15 MED ORDER — CLONIDINE HCL 0.1 MG PO TABS: 0.1000 mg | ORAL_TABLET | Freq: Every day | ORAL | Status: DC

## 2017-03-15 MED ORDER — CHLORDIAZEPOXIDE HCL 25 MG PO CAPS
25.0000 mg | ORAL_CAPSULE | ORAL | Status: AC
  Administered 2017-03-18 (×2): 25 mg via ORAL
  Filled 2017-03-15 (×2): qty 1

## 2017-03-15 MED ORDER — ONDANSETRON HCL 4 MG PO TABS: 4.0000 mg | ORAL_TABLET | Freq: Three times a day (TID) | ORAL | Status: DC | PRN

## 2017-03-15 MED ORDER — MAGNESIUM HYDROXIDE 400 MG/5ML PO SUSP
30.0000 mL | Freq: Every day | ORAL | Status: DC | PRN
  Administered 2017-03-17 – 2017-03-20 (×2): 30 mL via ORAL
  Filled 2017-03-15 (×2): qty 30

## 2017-03-15 MED ORDER — ALUM & MAG HYDROXIDE-SIMETH 200-200-20 MG/5ML PO SUSP: 30.0000 mL | Freq: Four times a day (QID) | ORAL | Status: DC | PRN

## 2017-03-15 MED ORDER — TRAZODONE HCL 50 MG PO TABS: 50.0000 mg | ORAL_TABLET | Freq: Every evening | ORAL | Status: DC | PRN

## 2017-03-15 MED ORDER — IBUPROFEN 200 MG PO TABS: 600.0000 mg | ORAL_TABLET | Freq: Three times a day (TID) | ORAL | Status: DC | PRN

## 2017-03-15 MED ORDER — LOPERAMIDE HCL 2 MG PO CAPS: 2.0000 mg | ORAL_CAPSULE | ORAL | Status: AC | PRN

## 2017-03-15 MED ORDER — PNEUMOCOCCAL VAC POLYVALENT 25 MCG/0.5ML IJ INJ: 0.5000 mL | INJECTION | INTRAMUSCULAR | Status: DC

## 2017-03-15 MED ORDER — CLONIDINE HCL 0.1 MG PO TABS
0.1000 mg | ORAL_TABLET | ORAL | Status: DC
  Filled 2017-03-15: qty 1

## 2017-03-15 MED ORDER — CHLORDIAZEPOXIDE HCL 25 MG PO CAPS
25.0000 mg | ORAL_CAPSULE | Freq: Three times a day (TID) | ORAL | Status: AC
  Administered 2017-03-17 (×3): 25 mg via ORAL
  Filled 2017-03-15 (×3): qty 1

## 2017-03-15 MED ORDER — DICYCLOMINE HCL 20 MG PO TABS: 20.0000 mg | ORAL_TABLET | Freq: Four times a day (QID) | ORAL | Status: AC | PRN

## 2017-03-15 MED ORDER — INFLUENZA VAC SPLIT QUAD 0.5 ML IM SUSY
0.5000 mL | PREFILLED_SYRINGE | INTRAMUSCULAR | Status: AC
  Administered 2017-03-16: 0.5 mL via INTRAMUSCULAR
  Filled 2017-03-15: qty 0.5

## 2017-03-15 MED ORDER — NICOTINE POLACRILEX 2 MG MT GUM
2.0000 mg | CHEWING_GUM | OROMUCOSAL | Status: DC | PRN
  Administered 2017-03-16 – 2017-03-23 (×19): 2 mg via ORAL
  Filled 2017-03-15: qty 1

## 2017-03-15 MED ORDER — HYDROXYZINE HCL 25 MG PO TABS
25.0000 mg | ORAL_TABLET | Freq: Three times a day (TID) | ORAL | Status: DC | PRN
  Administered 2017-03-15 – 2017-03-23 (×10): 25 mg via ORAL
  Filled 2017-03-15 (×2): qty 1
  Filled 2017-03-15: qty 30
  Filled 2017-03-15 (×8): qty 1

## 2017-03-15 MED ORDER — ENSURE ENLIVE PO LIQD
237.0000 mL | Freq: Three times a day (TID) | ORAL | Status: DC
  Administered 2017-03-16 – 2017-03-23 (×23): 237 mL via ORAL

## 2017-03-15 MED ORDER — VITAMIN B-1 100 MG PO TABS
100.0000 mg | ORAL_TABLET | Freq: Every day | ORAL | Status: DC
  Administered 2017-03-16 – 2017-03-23 (×8): 100 mg via ORAL
  Filled 2017-03-15 (×10): qty 1

## 2017-03-15 MED ORDER — ONDANSETRON 4 MG PO TBDP: 4.0000 mg | ORAL_TABLET | Freq: Four times a day (QID) | ORAL | Status: AC | PRN

## 2017-03-15 MED ORDER — ADULT MULTIVITAMIN W/MINERALS CH
1.0000 | ORAL_TABLET | Freq: Every day | ORAL | Status: DC
Start: 2017-03-16 — End: 2017-03-23
  Administered 2017-03-16 – 2017-03-23 (×8): 1 via ORAL
  Filled 2017-03-15 (×10): qty 1

## 2017-03-15 MED ORDER — CHLORDIAZEPOXIDE HCL 25 MG PO CAPS
25.0000 mg | ORAL_CAPSULE | Freq: Every day | ORAL | Status: AC
  Administered 2017-03-19: 25 mg via ORAL
  Filled 2017-03-15: qty 1

## 2017-03-15 MED ORDER — ALUM & MAG HYDROXIDE-SIMETH 200-200-20 MG/5ML PO SUSP: 30.0000 mL | ORAL | Status: DC | PRN

## 2017-03-15 MED ORDER — METHOCARBAMOL 500 MG PO TABS
500.0000 mg | ORAL_TABLET | Freq: Three times a day (TID) | ORAL | Status: AC | PRN
  Administered 2017-03-15 – 2017-03-20 (×9): 500 mg via ORAL
  Filled 2017-03-15 (×9): qty 1

## 2017-03-15 MED ORDER — NICOTINE 21 MG/24HR TD PT24
21.0000 mg | MEDICATED_PATCH | Freq: Every day | TRANSDERMAL | Status: DC
  Administered 2017-03-15: 21 mg via TRANSDERMAL
  Filled 2017-03-15: qty 1
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
## 2017-03-15 NOTE — BH Assessment (Signed)
[facility] Assessment Progress Note  Per Nanine Means, DNP, this pt requires psychiatric hospitalization at this time.  Malva Limes, RN, [facility] has assigned pt to [facility], however, they are not currently ready to receive pt, and pt's blood pressure is too high as of the last vital signs check.  She and the pt's nurse, Ginger [phone]) will coordinate transfer going forward.  Pt has signed Voluntary Admission and Consent for Treatment, as well as Consent to Release Information to no one, and signed forms have been faxed to [facility].  Ginger has been notified, and agrees to send original paperwork along with pt via Juel Burrow, and to call report to [phone] when the time comes.  Doylene Canning, MA Triage Specialist [phone]

## 2017-03-15 NOTE — ED Notes (Signed)
Pt provided room # @ [facility].
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
## 2017-03-15 NOTE — ED Notes (Signed)
Bed: ZO10 Expected date:  Expected time:  Means of arrival:  Comments: Triage 2
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
## 2017-03-16 DIAGNOSIS — T1491XA Suicide attempt, initial encounter: Secondary | ICD-10-CM

## 2017-03-16 DIAGNOSIS — T424X2A Poisoning by benzodiazepines, intentional self-harm, initial encounter: Secondary | ICD-10-CM

## 2017-03-16 DIAGNOSIS — F11222 Opioid dependence with intoxication with perceptual disturbance: Secondary | ICD-10-CM

## 2017-03-16 DIAGNOSIS — R450 Nervousness: Secondary | ICD-10-CM

## 2017-03-16 DIAGNOSIS — F410 Panic disorder [episodic paroxysmal anxiety] without agoraphobia: Secondary | ICD-10-CM

## 2017-03-16 DIAGNOSIS — F515 Nightmare disorder: Secondary | ICD-10-CM

## 2017-03-16 DIAGNOSIS — R5383 Other fatigue: Secondary | ICD-10-CM

## 2017-03-16 DIAGNOSIS — R454 Irritability and anger: Secondary | ICD-10-CM

## 2017-03-16 DIAGNOSIS — F17210 Nicotine dependence, cigarettes, uncomplicated: Secondary | ICD-10-CM

## 2017-03-16 DIAGNOSIS — R5381 Other malaise: Secondary | ICD-10-CM

## 2017-03-16 DIAGNOSIS — R4587 Impulsiveness: Secondary | ICD-10-CM

## 2017-03-16 DIAGNOSIS — Z62810 Personal history of physical and sexual abuse in childhood: Secondary | ICD-10-CM

## 2017-03-16 DIAGNOSIS — F419 Anxiety disorder, unspecified: Secondary | ICD-10-CM

## 2017-03-16 DIAGNOSIS — F332 Major depressive disorder, recurrent severe without psychotic features: Principal | ICD-10-CM

## 2017-03-16 DIAGNOSIS — R4586 Emotional lability: Secondary | ICD-10-CM

## 2017-03-16 DIAGNOSIS — R451 Restlessness and agitation: Secondary | ICD-10-CM

## 2017-03-16 DIAGNOSIS — Z590 Homelessness: Secondary | ICD-10-CM

## 2017-03-16 DIAGNOSIS — R110 Nausea: Secondary | ICD-10-CM

## 2017-03-16 DIAGNOSIS — G4700 Insomnia, unspecified: Secondary | ICD-10-CM

## 2017-03-16 LAB — LIPID PANEL
Cholesterol: 190 mg/dL (ref 0–200)
HDL: 71 mg/dL (ref >40)
LDL CALC: 80 mg/dL (ref 0–99)
TRIGLYCERIDES: 193 mg/dL — AB (ref <150)
Total CHOL/HDL Ratio: 2.7 RATIO
VLDL: 39 mg/dL (ref 0–40)

## 2017-03-16 LAB — URINALYSIS, ROUTINE W REFLEX MICROSCOPIC
BILIRUBIN URINE: NEGATIVE
Glucose, UA: NEGATIVE mg/dL
Hgb urine dipstick: NEGATIVE
KETONES UR: NEGATIVE mg/dL
Nitrite: NEGATIVE
PH: 6 (ref 5.0–8.0)
PROTEIN: NEGATIVE mg/dL
Specific Gravity, Urine: 1.023 (ref 1.005–1.030)

## 2017-03-16 LAB — TSH: TSH: 1.706 u[IU]/mL (ref 0.350–4.500)

## 2017-03-16 LAB — HEMOGLOBIN A1C
HEMOGLOBIN A1C: 5.1 % (ref 4.8–5.6)
Mean Plasma Glucose: 99.67 mg/dL

## 2017-03-16 LAB — RAPID HIV SCREEN (HIV 1/2 AB+AG)
HIV 1/2 Antibodies: NONREACTIVE
HIV-1 P24 Antigen - HIV24: NONREACTIVE

## 2017-03-16 MED ORDER — TRAZODONE HCL 100 MG PO TABS
100.0000 mg | ORAL_TABLET | Freq: Every day | ORAL | Status: DC
  Filled 2017-03-16: qty 1

## 2017-03-16 MED ORDER — GABAPENTIN 100 MG PO CAPS
200.0000 mg | ORAL_CAPSULE | Freq: Three times a day (TID) | ORAL | Status: DC
  Administered 2017-03-16 – 2017-03-23 (×21): 200 mg via ORAL
  Filled 2017-03-16 (×2): qty 2
  Filled 2017-03-16: qty 180
  Filled 2017-03-16 (×17): qty 2
  Filled 2017-03-16: qty 180
  Filled 2017-03-16: qty 2
  Filled 2017-03-16: qty 180
  Filled 2017-03-16 (×7): qty 2

## 2017-03-16 MED ORDER — VENLAFAXINE HCL ER 37.5 MG PO CP24
37.5000 mg | ORAL_CAPSULE | Freq: Every day | ORAL | Status: DC
  Administered 2017-03-17 – 2017-03-18 (×2): 37.5 mg via ORAL
  Filled 2017-03-16 (×4): qty 1

## 2017-03-16 MED ORDER — RISPERIDONE 1 MG PO TABS
1.0000 mg | ORAL_TABLET | Freq: Every day | ORAL | Status: DC
  Filled 2017-03-16: qty 1

## 2017-03-16 MED ORDER — TRAZODONE HCL 50 MG PO TABS
50.0000 mg | ORAL_TABLET | Freq: Every evening | ORAL | Status: DC | PRN
  Administered 2017-03-16 – 2017-03-22 (×7): 50 mg via ORAL
  Filled 2017-03-16: qty 30
  Filled 2017-03-16 (×6): qty 1
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
## 2017-03-16 NOTE — Tx Team (Signed)
Initial Treatment Plan 03/16/2017 12:10 AM [name] WUJ:[id number]    PATIENT STRESSORS: Financial difficulties Loss of support systems (mother-death, sister-moved away, husband on the road) Occupational concerns Substance abuse Traumatic event   PATIENT STRENGTHS: Ability for insight Average or above average intelligence Capable of independent living Communication skills General fund of knowledge   PATIENT IDENTIFIED PROBLEMS: "I don't want to be here, I want to be with my mom" - SI  "I want to go to long term treatment"- substance abuse  "I have no where to live, I've been prostituting myself"  depression               DISCHARGE CRITERIA:  Ability to meet basic life and health needs Adequate post-discharge living arrangements Improved stabilization in mood, thinking, and/or behavior Reduction of life-threatening or endangering symptoms to within safe limits Safe-care adequate arrangements made Withdrawal symptoms are absent or subacute and managed without 24-hour nursing intervention  PRELIMINARY DISCHARGE PLAN: Attend PHP/IOP Placement in alternative living arrangements  PATIENT/FAMILY INVOLVEMENT: This treatment plan has been presented to and reviewed with the patient, Charlotte Strong.  The patient and family have been given the opportunity to ask questions and make suggestions.  Aurora Mask, RN 03/16/2017, 12:10 AM

## 2017-03-16 NOTE — Progress Notes (Signed)
Recreation Therapy Notes  Date: 03/16/17 Time: 0930 Location: [address] Dayroom  Group Topic: Stress Management  Goal Area(s) Addresses:  Patient will verbalize importance of using healthy stress management.  Patient will identify positive emotions associated with healthy stress management.   Intervention: Stress Management  Activity :  Progressive Muscle Relaxation.  LRT introduced the stress management technique of progressive muscle relaxation.  LRT read a script to guide patients through tensing and relaxing each muscle group.  Patients were to follow along as script was read to engage in the activity.  Education: Stress Management, Discharge Planning.   Education Outcome: Acknowledges edcuation/In group clarification offered/Needs additional education  Clinical Observations/Feedback: Pt did not attend group.   Caroll Rancher, LRT/CTRS        Caroll Rancher A 03/16/2017 11:36 AM
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
## 2017-03-16 NOTE — Tx Team (Signed)
Interdisciplinary Treatment and Diagnostic Plan Update 03/16/2017 Time of Session: 9:30am  Charlotte Strong  MRN: [id number]  Principal Diagnosis: MDD severe, substance use disorder  Secondary Diagnoses: Active Problems:   Severe recurrent major depression without psychotic features (HCC)   Current Medications:  Current Facility-Administered Medications  Medication Dose Route Frequency Provider Last Rate Last Dose  . acetaminophen (TYLENOL) tablet 650 mg  650 mg Oral Q6H PRN Lindon Romp A, NP   650 mg at 03/15/17 2354  . alum & mag hydroxide-simeth (MAALOX/MYLANTA) 200-200-20 MG/5ML suspension 30 mL  30 mL Oral Q4H PRN Lindon Romp A, NP      . chlordiazePOXIDE (LIBRIUM) capsule 25 mg  25 mg Oral Q6H PRN Lindon Romp A, NP      . chlordiazePOXIDE (LIBRIUM) capsule 25 mg  25 mg Oral QID Lindon Romp A, NP   25 mg at 03/15/17 2354   Followed by  . [START ON 03/17/2017] chlordiazePOXIDE (LIBRIUM) capsule 25 mg  25 mg Oral TID Rozetta Nunnery, NP       Followed by  . [START ON 03/18/2017] chlordiazePOXIDE (LIBRIUM) capsule 25 mg  25 mg Oral BH-qamhs Rozetta Nunnery, NP       Followed by  . [START ON 03/19/2017] chlordiazePOXIDE (LIBRIUM) capsule 25 mg  25 mg Oral Daily Lindon Romp A, NP      . cloNIDine (CATAPRES) tablet 0.1 mg  0.1 mg Oral QID Lindon Romp A, NP   0.1 mg at 03/15/17 2354   Followed by  . [START ON 03/18/2017] cloNIDine (CATAPRES) tablet 0.1 mg  0.1 mg Oral BH-qamhs Rozetta Nunnery, NP       Followed by  . [START ON 03/20/2017] cloNIDine (CATAPRES) tablet 0.1 mg  0.1 mg Oral QAC breakfast Lindon Romp A, NP      . dicyclomine (BENTYL) tablet 20 mg  20 mg Oral Q6H PRN Lindon Romp A, NP      . feeding supplement (ENSURE ENLIVE) (ENSURE ENLIVE) liquid 237 mL  237 mL Oral TID BM Lindon Romp A, NP      . hydrOXYzine (ATARAX/VISTARIL) tablet 25 mg  25 mg Oral TID PRN Lindon Romp A, NP   25 mg at 03/15/17 2354  . Influenza vac split quadrivalent PF (FLUARIX) injection 0.5 mL  0.5 mL  Intramuscular Tomorrow-1000 Lindon Romp A, NP      . loperamide (IMODIUM) capsule 2-4 mg  2-4 mg Oral PRN Lindon Romp A, NP      . magnesium hydroxide (MILK OF MAGNESIA) suspension 30 mL  30 mL Oral Daily PRN Lindon Romp A, NP      . methocarbamol (ROBAXIN) tablet 500 mg  500 mg Oral Q8H PRN Lindon Romp A, NP   500 mg at 03/15/17 2354  . multivitamin with minerals tablet 1 tablet  1 tablet Oral Daily Lindon Romp A, NP      . naproxen (NAPROSYN) tablet 500 mg  500 mg Oral BID PRN Lindon Romp A, NP      . nicotine polacrilex (NICORETTE) gum 2 mg  2 mg Oral PRN Lindon Romp A, NP      . ondansetron (ZOFRAN-ODT) disintegrating tablet 4 mg  4 mg Oral Q6H PRN Lindon Romp A, NP      . pneumococcal 23 valent vaccine (PNU-IMMUNE) injection 0.5 mL  0.5 mL Intramuscular Tomorrow-1000 Lindon Romp A, NP      . thiamine (VITAMIN B-1) tablet 100 mg  100 mg Oral Daily Rozetta Nunnery, NP      .  traZODone (DESYREL) tablet 50 mg  50 mg Oral QHS PRN Rozetta Nunnery, NP        PTA Medications: Prescriptions Prior to Admission  Medication Sig Dispense Refill Last Dose  . ibuprofen (ADVIL,MOTRIN) 200 MG tablet Take 800 mg by mouth every 6 (six) hours as needed for mild pain or cramping.   Past Month at Unknown time  . naproxen (NAPROSYN) 375 MG tablet Take one tablet twice daily as needed for chest wall pain. (Patient not taking: Reported on 03/15/2017) 20 tablet 0 Not Taking at Unknown time  . naproxen sodium (ANAPROX) 220 MG tablet Take 440 mg by mouth every 8 (eight) hours as needed (chest pain).   Past Month at Unknown time    Treatment Modalities: Medication Management, Group therapy, Case management,  1 to 1 session with clinician, Psychoeducation, Recreational therapy.  Patient Stressors: Financial difficulties Loss of support systems (mother-death, sister-moved away, husband on the road) Occupational concerns Substance abuse Traumatic event Patient Strengths: Ability for insight Average or above  average intelligence Capable of independent living Curator fund of knowledge  Physician Treatment Plan for Primary Diagnosis: MDD severe, substance use disorder  Long Term Goal(s): Improvement in symptoms so as ready for discharge Short Term Goals:    Medication Management: Evaluate patient's response, side effects, and tolerance of medication regimen.  Therapeutic Interventions: 1 to 1 sessions, Unit Group sessions and Medication administration.  Evaluation of Outcomes: Not Met  Physician Treatment Plan for Secondary Diagnosis: Active Problems:   Severe recurrent major depression without psychotic features ([place])  Long Term Goal(s): Improvement in symptoms so as ready for discharge  Short Term Goals:    Medication Management: Evaluate patient's response, side effects, and tolerance of medication regimen.  Therapeutic Interventions: 1 to 1 sessions, Unit Group sessions and Medication administration.  Evaluation of Outcomes: Not Met  RN Treatment Plan for Primary Diagnosis: MDD severe, substance use disorder  Long Term Goal(s): Knowledge of disease and therapeutic regimen to maintain health will improve  Short Term Goals: Compliance with prescribed medications will improve  Medication Management: RN will administer medications as ordered by provider, will assess and evaluate patient's response and provide education to patient for prescribed medication. RN will report any adverse and/or side effects to prescribing provider.  Therapeutic Interventions: 1 on 1 counseling sessions, Psychoeducation, Medication administration, Evaluate responses to treatment, Monitor vital signs and CBGs as ordered, Perform/monitor CIWA, COWS, AIMS and Fall Risk screenings as ordered, Perform wound care treatments as ordered.  Evaluation of Outcomes: Not Met  LCSW Treatment Plan for Primary Diagnosis: MDD severe, substance use disorder  Long Term Goal(s): Safe transition to  appropriate next level of care at discharge, Engage patient in therapeutic group addressing interpersonal concerns. Short Term Goals: Engage patient in aftercare planning with referrals and resources, Increase emotional regulation, Facilitate patient progression through stages of change regarding substance use diagnoses and concerns, Identify triggers associated with mental health/substance abuse issues and Increase skills for wellness and recovery  Therapeutic Interventions: Assess for all discharge needs, 1 to 1 time with Social worker, Explore available resources and support systems, Assess for adequacy in community support network, Educate family and significant other(s) on suicide prevention, Complete Psychosocial Assessment, Interpersonal group therapy.  Evaluation of Outcomes: Not Met  Progress in Treatment: Attending groups: Pt is new to milieu, continuing to assess  Participating in groups: Pt is new to milieu, continuing to assess  Taking medication as prescribed: Yes, MD continues to assess for medication  changes as needed Toleration medication: Yes, no side effects reported at this time Family/Significant other contact made: No, CSW assessing for appropriate contact Patient understands diagnosis: Continuing to assess Discussing patient identified problems/goals with staff: Yes Medical problems stabilized or resolved: Yes Denies suicidal/homicidal ideation: No, pt recently admitted with SI. Issues/concerns per patient self-inventory: None Other: N/A  New problem(s) identified: None identified at this time.   New Short Term/Long Term Goal(s): None identified at this time.   Discharge Plan or Barriers: CSW still assessing for an appropriate plan.  Reason for Continuation of Hospitalization:  Anxiety  Depression Medication stabilization Suicidal ideation Withdrawal symptoms  Estimated Length of Stay: 3-5 days; Estimated discharge date 03/21/17  Attendees: Patient:  03/16/2017 9:15 AM  Physician: Dr. Parke Poisson 03/16/2017 9:15 AM  Nursing: Francine Graven, RN 03/16/2017 9:15 AM  RN Care Manager: 03/16/2017 9:15 AM  Social Worker: Matthew Saras, [place] 03/16/2017 9:15 AM  Recreational Therapist:  03/16/2017 9:15 AM  Other: Lindell Spar, NP 03/16/2017 9:15 AM  Other:  03/16/2017 9:15 AM  Other: 03/16/2017 9:15 AM  Scribe for Treatment Team: Georga Kaufmann, MSW,LCSWA 03/16/2017 9:15 AM

## 2017-03-16 NOTE — BHH Group Notes (Signed)
LCSW Group Therapy Note  03/16/2017 1:15pm  Type of Therapy and Topic:  Group Therapy:  Feelings around Relapse and Recovery  Participation Level: Active   Description of Group:    Patients in this group will discuss emotions they experience before and after a relapse. They will process how experiencing these feelings, or avoidance of experiencing them, relates to having a relapse. Facilitator will guide patients to explore emotions they have related to recovery. Patients will be encouraged to process which emotions are more powerful. They will be guided to discuss the emotional reaction significant others in their lives may have to their relapse or recovery. Patients will be assisted in exploring ways to respond to the emotions of others without this contributing to a relapse.  Therapeutic Goals: 1. Patient will identify two or more emotions that lead to a relapse for them 2. Patient will identify two emotions that result when they relapse 3. Patient will identify two emotions related to recovery 4. Patient will demonstrate ability to communicate their needs through discussion and/or role plays   Summary of Patient Progress: Pt was present for the duration of the group. Pt states that she has been able to maintain a long period of sobriety in the past but that was mainly due to the fact that she was in prison at the time. Pt states that she has a lot of negative feelings from the past and she often uses to help numb those thoughts and memories. Pt was tearful at certain points during the group. Pt was engaged throughout the entire discussion and offered meaningful feedback to peers.    Therapeutic Modalities:   Cognitive Behavioral Therapy Solution-Focused Therapy Assertiveness Training Relapse Prevention Therapy   Jonathon Jordan, MSW, LCSWA 03/16/2017 3:28 PM

## 2017-03-16 NOTE — Plan of Care (Signed)
Problem: Medication: Goal: Compliance with prescribed medication regimen will improve Outcome: Progressing Patient compliant with prescribed medication.
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
## 2017-03-16 NOTE — Progress Notes (Signed)
Dar Note: Patient presents with anxious affect and mood.  Reports withdrawal symptoms of cramping, tremors, irritability, muscle aches and spasm.  Patient in her room most of this shift.  Denies auditory and visual hallucinations.  Reports suicidal thoughts but verbally contracts for safety.  Medication given as prescribed.  Routine safety checks maintained.

## 2017-03-16 NOTE — Progress Notes (Signed)
NUTRITION ASSESSMENT  Pt identified as at risk on the Malnutrition Screen Tool  INTERVENTION: 1. Supplements: Continue Ensure Enlive po TID, each supplement provides 350 kcal and 20 grams of protein  NUTRITION DIAGNOSIS: Unintentional weight loss related to sub-optimal intake as evidenced by pt report.   Goal: Pt to meet >/= 90% of their estimated nutrition needs.  Monitor:  PO intake  Assessment:  Pt admitted with polysubstance abuse (THC, opiates, benzos, cocaine), depression and homelessness. Pt also grieving this loss of her mother this year. Pt reports weighing 148 lb a few months ago. This would be a 27% wt loss x a few months which would be significant for time frame.  Will continue ensure supplement order given underweight status and recent weight loss.  Height: Ht Readings from Last 1 Encounters:  03/15/17  (1.676 m)    Weight: Wt Readings from Last 1 Encounters:  03/15/17 108 lb (49 kg)    Weight Hx: Wt Readings from Last 10 Encounters:  03/15/17 108 lb (49 kg)  03/15/17 115 lb (52.2 kg)  09/10/15 127 lb (57.6 kg)  05/27/15 123 lb (55.8 kg)  10/14/14 125 lb 3.5 oz (56.8 kg)  10/04/14 145 lb 1 oz (65.8 kg)  10/07/13 115 lb (52.2 kg)  07/01/13 130 lb (59 kg)  12/11/11 121 lb (54.9 kg)  09/17/11 131 lb (59.4 kg)    BMI:  Body mass index is 17.43 kg/m. Pt meets criteria for underweight based on current BMI.  Estimated Nutritional Needs: Kcal: 25-30 kcal/kg Protein: > 1 gram protein/kg Fluid: 1 ml/kcal  Diet Order: Diet regular Room service appropriate? Yes; Fluid consistency: Thin Pt is also offered choice of unit snacks mid-morning and mid-afternoon.  Pt is eating as desired.   Lab results and medications reviewed.   Tilda Franco, MS, RD, LDN Pager: [phone] After Hours Pager: [phone]

## 2017-03-17 DIAGNOSIS — F1920 Other psychoactive substance dependence, uncomplicated: Secondary | ICD-10-CM

## 2017-03-17 DIAGNOSIS — R45851 Suicidal ideations: Secondary | ICD-10-CM

## 2017-03-17 DIAGNOSIS — F1310 Sedative, hypnotic or anxiolytic abuse, uncomplicated: Secondary | ICD-10-CM

## 2017-03-17 DIAGNOSIS — F1410 Cocaine abuse, uncomplicated: Secondary | ICD-10-CM

## 2017-03-17 DIAGNOSIS — F1210 Cannabis abuse, uncomplicated: Secondary | ICD-10-CM

## 2017-03-17 LAB — HEPATITIS B CORE ANTIBODY, TOTAL: Hep B Core Total Ab: NEGATIVE

## 2017-03-17 LAB — HEPATITIS B SURFACE ANTIBODY,QUALITATIVE: Hep B S Ab: REACTIVE

## 2017-03-17 LAB — HEPATITIS C ANTIBODY

## 2017-03-17 MED ORDER — METRONIDAZOLE 500 MG PO TABS
500.0000 mg | ORAL_TABLET | Freq: Two times a day (BID) | ORAL | Status: DC
  Administered 2017-03-17 – 2017-03-23 (×13): 500 mg via ORAL
  Filled 2017-03-17 (×6): qty 1
  Filled 2017-03-17: qty 2
  Filled 2017-03-17 (×8): qty 1

## 2017-03-17 NOTE — Progress Notes (Signed)
D: Patient's self inventory sheet: patient has fair sleep, received sleep medication.good  Appetite, low energy level, poor concentration. Rated depression 7/10, hopeless 10/10, anxiety 10/10. SI/HI/AVH: continues to endorse persistent SI, denies HI and  AVH. Physical complaints are lightheaded, dizziness, headaches, rash, pain in legs, arms back and chest.. Goal is "going to rehab long term". Plans to work on "talk to someone".   A: Medications administered, assessed medication knowledge and education given on medication regimen.  Emotional support and encouragement given patient. R: Continues to have  SI and deny HI , contracts for safety. Safety maintained with 15 minute checks.
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
## 2017-03-17 NOTE — Progress Notes (Signed)
D: Pt observed in the dayroom interacting with peers. Pt at the time of assessment endorsed moderate anxiety, depression and passive SI-Pt contracts for safety; states, "I feel that there is no hope for me; I want to be better but I don't know if I'm strong enough." Pt also complained of generalized body pain-see MAR. A: Medications offered as prescribed. Pt given the opportunity to ask questions and state concerns. Support, encouragement, and safe environment provided. R: Pt was med compliant. All patient's questions and concerns addressed. 15-minute safety checks continue. Safety checks continue. Pt did attend AA group.

## 2017-03-17 NOTE — B[facility] Group Notes (Signed)
LCSW Group Therapy Note  03/17/2017     10:00-11:00AM  Type of Therapy and Topic:  Group Therapy:  Decisional Balance/Substance Use  Participation Level:  Active        . Description of Group:  The main focus of today's process group was learning how to use a decisional balance exercise to make a decision about whether to change an unhealthy coping skill, as well as how to use the information gathered in the actual process of planning that change.  Patients listed some of their most frequently utilized unhealthy coping techniques and CSW pointed out the similarities.  Motivational Interviewing and the whiteboard were utilized to help patients explore in-depth the perceived benefits and costs of a specific, shared unhealthy coping technique (drinking & drugging) as well as the benefits and costs of replacing that with other, healthy coping skills.  A handout was distributed for patients to be able to do this exercise for themselves.     Therapeutic Goals 1. Patient will be able to utilize the decision balance exercise on their own 2. Patient will list coping skills they use to fulfill their needs 3. Patient will identify the differences between healthy and  unhealthy coping skills 4. Patient will verbalize the costs and benefits of drinking/drugging versus making the choice to change 5. Patient will learn how to use the exercise to identify the most important supports to put in place so that they can succeed in a change to which they commit  Summary of Patient Progress: During group, patient expressed that she tends to bottle up her emotions and isolate herself.  She also talked about having substance abuse issues that have led to homelessness.  She showed good insight.   Therapeutic Modalities Cognitive Behavioral Therapy Motivational Interviewing   Lynnell Chad, LCSW

## 2017-03-18 MED ORDER — LINACLOTIDE 145 MCG PO CAPS
145.0000 ug | ORAL_CAPSULE | Freq: Every day | ORAL | Status: DC | PRN
  Administered 2017-03-19: 145 ug via ORAL
  Filled 2017-03-18 (×4): qty 1

## 2017-03-18 MED ORDER — VENLAFAXINE HCL ER 75 MG PO CP24
75.0000 mg | ORAL_CAPSULE | Freq: Every day | ORAL | Status: DC
  Administered 2017-03-19 – 2017-03-23 (×5): 75 mg via ORAL
  Filled 2017-03-18: qty 1
  Filled 2017-03-18: qty 30
  Filled 2017-03-18 (×5): qty 1

## 2017-03-18 NOTE — Progress Notes (Signed)
D:  Charlotte Strong has been in the day room much of the day.  She denies any SI/HI or A/V hallucinations.  She reported generalized body aches/cramping and Robaxin was given with good relief.  She completed her self inventory and reported her depression was 7/10. Hopelessness 8/10 and her anxiety was 10/10.  She reported her goal for today was "work on my depression, my pain level and anxiety" and she will accomplish this goal by "my meds and talking with my doctor or nurse."  She has been attending group and interacting well with staff and peers.   A:  1:1 interaction for support and encouragement.  Medications given as ordered.  Q 15 minute checks maintained for safety.  Encouraged continued participation in group and unit activities.   R:  Charlotte Strong remains safe on the unit.  She took medications without difficulty.  We will continue to monitor the progress towards her goals.

## 2017-03-18 NOTE — B[facility] Group Notes (Signed)
  [facility] Group Notes:  (Nursing/MHT/Case Management/Adjunct)  Date:  03/18/2017  Time:  1400  Type of Therapy:  Nurse Education  Participation Level:  Active  Participation Quality:  Appropriate and Attentive  Affect:  Appropriate  Cognitive:  Alert and Appropriate  Insight:  Improving  Engagement in Group:  Engaged  Modes of Intervention:  Discussion and Guided Meditation  Summary of Progress/Problems:  Topic discussed on stress and stress management. Pt was appropriative and participated well with group topic.    Charlotte Strong V Tacey Dimaggio 03/18/2017, 2:43 PM  

## 2017-03-18 NOTE — BHH Group Notes (Signed)
[facility] LCSW Group Therapy Note  Date/Time:  03/18/2017 10:00-11:00AM  Type of Therapy and Topic:  Group Therapy:  Healthy and Unhealthy Supports  Participation Level:  Active   Description of Group:  Patients in this group were introduced to the idea of adding a variety of healthy supports to address the various needs in their lives. The picture on the front of Sunday's workbook was used to demonstrate why more supports are needed in every patient's life.  Patients identified and described healthy supports versus unhealthy supports in general, then gave examples of each in their own lives.   They discussed what additional healthy supports could be helpful in their recovery and wellness after discharge in order to prevent future hospitalizations.   An emphasis was placed on using counselor, doctor, therapy groups, 12-step groups, and problem-specific support groups to expand supports.  They also worked as a group on developing a specific plan for several patients to deal with unhealthy supports through boundary-setting, psychoeducation with loved ones, and even termination of relationships.   Therapeutic Goals:   1)  discuss importance of adding supports to stay well once out of the hospital  2)  compare healthy versus unhealthy supports and identify some examples of each  3)  generate ideas and descriptions of healthy supports that can be added  4)  offer mutual support about how to address unhealthy supports  5)  encourage active participation in and adherence to discharge plan    Summary of Patient Progress:  The patient shared that the current healthy/unhealthy supports available in their life are friends (healthy) and her family that has pushed her away due to her substance abuse although they also abuse substances, but claim hers are worse.  She was very interactive and supportive of others in group.   Therapeutic Modalities:   Motivational Interviewing Brief Solution-Focused  Therapy  Ambrose Mantle, LCSW 03/18/2017, 8:29 AM
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
## 2017-03-18 NOTE — Progress Notes (Signed)
D    Pt is pleasant on approach and cooperative   She interacts appropriately with her peers    She endorses anxiety and depression  A   Verbal support given  Medications administered and effectiveness monitored   Q 15 min checks  R   Pt is safe at present time and receptive to verbal support

## 2017-03-18 NOTE — Progress Notes (Signed)
Patient ID: Charlotte Strong, female   DOB: 1976/04/18, 41 y.o.   MRN: [id number]   D: Assumed care of patient @ 2330. Patient in bed sleeping. Respiration regular and unlabored. No sign of distress noted at this time A: 15 mins checks for safety. R: Patient remains safe.

## 2017-03-19 DIAGNOSIS — R52 Pain, unspecified: Secondary | ICD-10-CM

## 2017-03-19 DIAGNOSIS — N760 Acute vaginitis: Secondary | ICD-10-CM

## 2017-03-19 DIAGNOSIS — F1910 Other psychoactive substance abuse, uncomplicated: Secondary | ICD-10-CM

## 2017-03-19 LAB — GC/CHLAMYDIA PROBE AMP (~~LOC~~) NOT AT ARMC
Chlamydia: NEGATIVE
Neisseria Gonorrhea: NEGATIVE

## 2017-03-19 NOTE — [facility] Group Notes (Signed)
LCSW Group Therapy Note   03/19/2017 1:15pm   Type of Therapy and Topic:  Group Therapy:  Overcoming Obstacles   Participation Level: Active   Description of Group:    In this group patients will be encouraged to explore what they see as obstacles to their own wellness and recovery. They will be guided to discuss their thoughts, feelings, and behaviors related to these obstacles. The group will process together ways to cope with barriers, with attention given to specific choices patients can make. Each patient will be challenged to identify changes they are motivated to make in order to overcome their obstacles. This group will be process-oriented, with patients participating in exploration of their own experiences as well as giving and receiving support and challenge from other group members.   Therapeutic Goals: 1. Patient will identify personal and current obstacles as they relate to admission. 2. Patient will identify barriers that currently interfere with their wellness or overcoming obstacles.  3. Patient will identify feelings, thought process and behaviors related to these barriers. 4. Patient will identify two changes they are willing to make to overcome these obstacles:      Summary of Patient Progress Pt was present for the duration of the group. Pt states that the obstacle she is currently facing is getting into treatment at [facility]. Pt states that if she doesn't get into treatment she will be homeless and this makes her very anxious.      Therapeutic Modalities:   Cognitive Behavioral Therapy Solution Focused Therapy Motivational Interviewing Relapse Prevention Therapy  Jonathon Jordan, MSW, [facility] 03/19/2017 3:57 PM
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
## 2017-03-19 NOTE — Progress Notes (Signed)
Recreation Therapy Notes  Date: 03/19/17 Time: 0930 Location: [address] Dayroom  Group Topic: Stress Management  Goal Area(s) Addresses:  Patient will verbalize importance of using healthy stress management.  Patient will identify positive emotions associated with healthy stress management.   Behavioral Response: Engaged  Intervention: Stress Management  Activity :  Meditation.  LRT introduced the stress management technique of meditation.  LRT played a meditation from the Calm app that explained stress and how we take on and exhibit stress throughout the body.  Patients were to listen and follow along as the meditation played to engage in the technique.  Education:  Stress Management, Discharge Planning.   Education Outcome: Acknowledges edcuation/In group clarification offered/Needs additional education  Clinical Observations/Feedback: Pt attended group.    Caroll Rancher, LRT/CTRS         Caroll Rancher A 03/19/2017 12:22 PM

## 2017-03-19 NOTE — Progress Notes (Signed)
DAR NOTE: Patient presents with anxious affect and depressed mood. Denies pain, auditory and visual hallucinations. Pt reports good sleep, good appetite, low energy, and poor concentration. Rates depression at 6, hopelessness at 4, and anxiety at 8.  Maintained on routine safety checks.  Medications given as prescribed.  Support and encouragement offered as needed.  Attended group and participated.  States goal for today is " my anxiety, depressions and my sometimes suicide thoughts, that come and go."  Patient observed socializing with peers in the dayroom.  Offered no complaint.

## 2017-03-19 NOTE — Progress Notes (Signed)
Patient ID: Charlotte Strong, female   DOB: 09-05-1975, 41 y.o.   MRN: [id number]  Pt currently presents with a flat affect and guarded behavior. Pt forwards little to Clinical research associate. Pt reports good sleep with current medication regimen. Pt main concern is her L big toe nail which fell of last night. Serosanguineous drainage noted.   Pt provided with medications per providers orders. Pt's labs and vitals were monitored throughout the night. Pt given a 1:1 about emotional and mental status. Pt supported and encouraged to express concerns and questions. Pt educated on medications. Pt given instructions on caring for her wound and given dressing supplies.  Pt's safety ensured with 15 minute and environmental checks. Pt currently denies SI/HI and A/V hallucinations. Pt verbally agrees to seek staff if SI/HI or A/VH occurs and to consult with staff before acting on any harmful thoughts. Will continue POC.

## 2017-03-20 DIAGNOSIS — F1994 Other psychoactive substance use, unspecified with psychoactive substance-induced mood disorder: Secondary | ICD-10-CM

## 2017-03-20 DIAGNOSIS — Z560 Unemployment, unspecified: Secondary | ICD-10-CM

## 2017-03-20 DIAGNOSIS — F1110 Opioid abuse, uncomplicated: Secondary | ICD-10-CM

## 2017-03-20 NOTE — [facility] Group Notes (Signed)
[facility] Group Therapy 03/20/2017 1:15pm  Type of Therapy: [facility] [employer] Presentation  Participation Level: Active  Participation Quality: Attentive  Affect: Appropriate  Cognitive: Oriented  Insight: Developing/Improving  Engagement in Therapy: Engaged  Modes of Intervention: Discussion, Education and Socialization  Summary of Progress/Problems: [facility] [employer] (M[facility]) Speaker came to talk about his personal journey with mental health. The pt processed ways by which to relate to the speaker. MHA speaker provided handouts and educational information pertaining to groups and services offered by the MHA. Pt was engaged in speaker's presentation and was receptive to resources provided.    Tameia Rafferty N Smart, LCSW 03/20/2017 2:42 PM  
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
## 2017-03-20 NOTE — Progress Notes (Signed)
DAR NOTE: Patient presents with anxious affect and depressed mood. Pt has been in the dayroom interacting with peers. Pt reports fair night sleep, good appetite, low energy, and poor concentration. Pt stated she is interested in long term treatment at [facility]. Denies pain, auditory and visual hallucinations.  Rates depression at 4, hopelessness at 5, and anxiety at 8.  Maintained on routine safety checks.  Medications given as prescribed.  Support and encouragement offered as needed.  Attended group and participated.  States goal for today is " reach out to family."  Patient observed socializing with peers in the dayroom.  Offered no complaint.

## 2017-03-20 NOTE — Progress Notes (Signed)
[facility] Group Notes:  (Nursing/MHT/Case Management/Adjunct)  Date:  03/20/2017  Time:  1:23 AM  Type of Therapy:  Psychoeducational Skills  Participation Level:  Active  Participation Quality:  Attentive  Affect:  Blunted  Cognitive:  Appropriate  Insight:  Appropriate  Engagement in Group:  Developing/Improving  Modes of Intervention:  Education  Summary of Progress/Problems: Patient states that she felt drowsy thru out the day but managed to keep herself awake. Her goal for tomorrow is to speak with the social worker about finding her placement at [facility].   Ezrael Sam S 03/20/2017, 1:23 AM

## 2017-03-20 NOTE — Progress Notes (Signed)
Pt attended AA group this evening.  

## 2017-03-21 NOTE — [facility] Group Notes (Signed)
LCSW Group Therapy Note   03/21/2017 1:15pm   Type of Therapy and Topic:  Group Therapy:  Overcoming Obstacles   Participation Level:  Did Not Attend-pt invited. Chose to remain in bed.    Description of Group:    In this group patients will be encouraged to explore what they see as obstacles to their own wellness and recovery. They will be guided to discuss their thoughts, feelings, and behaviors related to these obstacles. The group will process together ways to cope with barriers, with attention given to specific choices patients can make. Each patient will be challenged to identify changes they are motivated to make in order to overcome their obstacles. This group will be process-oriented, with patients participating in exploration of their own experiences as well as giving and receiving support and challenge from other group members.   Therapeutic Goals: 1. Patient will identify personal and current obstacles as they relate to admission. 2. Patient will identify barriers that currently interfere with their wellness or overcoming obstacles.  3. Patient will identify feelings, thought process and behaviors related to these barriers. 4. Patient will identify two changes they are willing to make to overcome these obstacles:      Summary of Patient Progress      Therapeutic Modalities:   Cognitive Behavioral Therapy Solution Focused Therapy Motivational Interviewing Relapse Prevention Therapy  Ledell Peoples Smart, LCSW 03/21/2017 1:11 PM
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
## 2017-03-21 NOTE — Progress Notes (Signed)
Pt reports she had a good day and feels her meds are helping her with her mood.  She denies SI/HI/AVH.  She denies any withdrawal symptoms at this time.  She has been pleasant and cooperative.  She states that she is waiting for a bed at [facility] for further treatment for her substance abuse issues.  She received Robaxin for c/o back and leg pain per request with her night time meds.  She also had a c/o constipation and was given MOM at that time.  Support and encouragement offered.  Discharge plans are in process.  Safety maintained with q15 minute checks.
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
## 2017-03-21 NOTE — Progress Notes (Signed)
Pt attended NA group this evening.  

## 2017-03-21 NOTE — Progress Notes (Signed)
Recreation Therapy Notes  Date: 03/21/17 Time: 0930 Location: [address] Dayroom  Group Topic: Stress Management  Goal Area(s) Addresses:  Patient will verbalize importance of using healthy stress management.  Patient will identify positive emotions associated with healthy stress management.   Behavioral Response: Engaged  Intervention: Stress Management  Activity :  Guided Imagery.  LRT introduced the stress management technique of guided imagery.  LRT read a script leading patients on a journey floating on a cloud.  Patients were to follow a long as LRT read script to engage in the activity.  Education:  Stress Management, Discharge Planning.   Education Outcome: Acknowledges edcuation/In group clarification offered/Needs additional education  Clinical Observations/Feedback: Pt attended group.   Caroll Rancher, LRT/CTRS         Caroll Rancher A 03/21/2017 11:32 AM
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
## 2017-03-22 NOTE — Progress Notes (Signed)
D  ---   Pt agrees to contract for safety and denies pain.  She has good   eye contact and is  Friendly and plesant  to staff and peers. Pt interacts well with staff  .   Pt attends all   groups  and appears to be vested in treatment. She spends time in day room interacting with peers .  Pt takes medications as asked and shows no sign of adverse effects.  Pt ambulates hall without assistance and has a steady gait .  Pt is eating well and has good sleep.  Pt shows no negative behaviors .    ---  A  ---  Provide support , safety and encouragement  ---  R  ---  Pt remains safe on unit  Patient ID: Charlotte Strong, female   DOB: 1976/04/23, 41 y.o.   MRN: [id number]

## 2017-03-22 NOTE — Progress Notes (Signed)
Pt reports being a little sad this evening as two patients she became close to were discharged earlier today.  She did says that she had been accepted to [facility] and may possibly discharge tomorrow or Friday.  She says she is eager to take the next step to her recovery.  She denies SI/HI/AVH.  She denies any withdrawal symptoms.  She voices no needs or concerns at this time.  Pt has been pleasant and cooperative.  Support and encouragement offered.  Discharge plans are in process.  Safety maintained with q15 minute checks.
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
## 2017-03-22 NOTE — [facility] Group Notes (Signed)
LCSW Group Therapy Note  03/22/2017 1:15pm  Type of Therapy/Topic:  Group Therapy:  Balance in Life  Participation Level:  Active  Description of Group:    This group will address the concept of balance and how it feels and looks when one is unbalanced. Patients will be encouraged to process areas in their lives that are out of balance and identify reasons for remaining unbalanced. Facilitators will guide patients in utilizing problem-solving interventions to address and correct the stressor making their life unbalanced. Understanding and applying boundaries will be explored and addressed for obtaining and maintaining a balanced life. Patients will be encouraged to explore ways to assertively make their unbalanced needs known to significant others in their lives, using other group members and facilitator for support and feedback.  Therapeutic Goals: 1. Patient will identify two or more emotions or situations they have that consume much of in their lives. 2. Patient will identify signs/triggers that life has become out of balance:  3. Patient will identify two ways to set boundaries in order to achieve balance in their lives:  4. Patient will demonstrate ability to communicate their needs through discussion and/or role plays  Summary of Patient Progress:   Emilee was attentive and engaged during today's processing group. She reports feeling more balanced due to the progression of her discharge plan "going to [place] to wait for a bed at [facility]. I'm going to a safe haven type of place to wait." Dalisha reports that her husband and family members have not been answering her calls and states that she is upset about this but still hopeful about her future. She continues to show progress in the group setting with improving insight.    Therapeutic Modalities:   Cognitive Behavioral Therapy Solution-Focused Therapy Assertiveness Training  [employer], LCSW 03/22/2017 3:16 PM

## 2017-03-22 NOTE — Progress Notes (Signed)
Pt reports she has had a good day.  She denies SI/HI/AVH.  She tells Clinical research associate that she is going to [facility], and is looking forward to continuing her recovery there.  She admits to being a little nervous, but says she feels more hopeful to take this next step.  Pt has been pleasant and cooperative.  Support and encouragement offered.  Discharge plans are in process.  Safety maintained with q15 minute checks.

## 2017-03-23 MED ORDER — LINACLOTIDE 145 MCG PO CAPS: 145.0000 ug | ORAL_CAPSULE | Freq: Every day | ORAL | 0 refills | Status: DC | PRN

## 2017-03-23 MED ORDER — TRAZODONE HCL 50 MG PO TABS: 50.0000 mg | ORAL_TABLET | Freq: Every evening | ORAL | 0 refills | Status: DC | PRN

## 2017-03-23 MED ORDER — NICOTINE POLACRILEX 2 MG MT GUM: 2.0000 mg | CHEWING_GUM | OROMUCOSAL | 0 refills | Status: DC | PRN

## 2017-03-23 MED ORDER — HYDROXYZINE HCL 25 MG PO TABS: 25.0000 mg | ORAL_TABLET | Freq: Three times a day (TID) | ORAL | 0 refills | Status: DC | PRN

## 2017-03-23 MED ORDER — GABAPENTIN 100 MG PO CAPS: 200.0000 mg | ORAL_CAPSULE | Freq: Three times a day (TID) | ORAL | 0 refills | Status: DC

## 2017-03-23 MED ORDER — VENLAFAXINE HCL ER 75 MG PO CP24: 75.0000 mg | ORAL_CAPSULE | Freq: Every day | ORAL | 0 refills | Status: DC

## 2017-03-23 MED ORDER — METRONIDAZOLE 500 MG PO TABS: 500.0000 mg | ORAL_TABLET | Freq: Two times a day (BID) | ORAL | Status: DC

## 2017-03-23 NOTE — [facility] Suicide Risk Assessment (Signed)
[facility] Discharge Suicide Risk Assessment   Principal Problem: SIMD Discharge Diagnoses:  Patient Active Problem List   Diagnosis Date Noted  . Severe recurrent major depression without psychotic features (HCC) [F33.2] 03/15/2017  . Polysubstance dependence including opioid type drug, continuous use, with perceptual disturbance (HCC) [F11.222] 09/24/2015  . 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 Substance abuse (HCC) [F19.10]   . Suicidal ideation [R45.851]   . Left foot drop [M21.372] 12/01/2014  . Left wrist drop [M21.332] 12/01/2014  . Substance induced mood disorder (HCC) [F19.94] 10/13/2014  . 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 Compartment syndrome of lower extremity (HCC) [T79.A29A] 10/08/2014  . Wheezing [R06.2]   . Neuropathic pain [M79.2]   . Depression with anxiety [F41.8]   . Palliative care encounter [Z51.5]   . Pain [R52]   . Encephalopathy acute [G93.40]   . Aspiration pneumonia (HCC) [J69.0]   . Altered mental status [R41.82] 09/30/2014  . Acute renal failure (HCC) [N17.9] 09/30/2014  . Rhabdomyolysis [M62.82] 09/30/2014  . Acute respiratory failure with hypoxia (HCC) [J96.01] 09/30/2014  . Lactic acidosis [E87.2] 09/30/2014    Total Time spent with patient: 30 minutes  Musculoskeletal: Strength & Muscle Tone: within normal limits Gait & Station: normal Patient leans: N/A  Psychiatric Specialty Exam: Review of Systems  Constitutional: Negative.   HENT: Negative.   Eyes: Negative.   Respiratory: Negative.   Cardiovascular: Negative.   Gastrointestinal: Negative.   Genitourinary: Negative.   Musculoskeletal: Negative.   Skin: Negative.   Neurological: Negative.   Endo/Heme/Allergies: Negative.   Psychiatric/Behavioral: Negative for depression, hallucinations, memory loss, substance abuse and suicidal ideas. The patient is not nervous/anxious and does not have insomnia.     Blood  pressure 109/68, pulse 78, temperature 98.4 F (36.9 C), temperature source Oral, resp. rate 20, height  (1.676 m), weight 49 kg (108 lb), last menstrual period 02/28/2017, SpO2 100 %.Body mass index is 17.43 kg/m.  General Appearance: Neatly dressed, pleasant, engaging well and cooperative. Appropriate behavior. Not in any distress. Good relatedness. Not internally stimulated.  Eye Contact::  Good  Speech:  Spontaneous, normal prosody. Normal tone and rate.   Volume:  Normal  Mood:  Euthymic  Affect:  Appropriate and Full Range  Thought Process:  Linear  Orientation:  Full (Time, Place, and Person)  Thought Content:  No delusional theme. No preoccupation with violent thoughts. No negative ruminations. No obsession.  No hallucination in any modality.   Suicidal Thoughts:  No  Homicidal Thoughts:  No  Memory:  Immediate;   Good Recent;   Good Remote;   Good  Judgement:  Good  Insight:  Good  Psychomotor Activity:  Normal  Concentration:  Good  Recall:  Good  Fund of Knowledge:Good  Language: Good  Akathisia:  Negative  Handed:    AIMS (if indicated):     Assets:  Communication Skills Desire for Improvement Physical Health Resilience  Sleep:  Number of Hours: 5.75  Cognition: WNL  ADL's:  Intact   Clinical Assessment::   41 y.o Caucasian female, single, homeless, unemployed. Background history of SUD. Presented to the ER voluntarily in company of her mother's ex- boyfriend . Reports worsening depression. Reports suicidal thoughts with a plan to overdose on medication. Stressed by being homeless and long history of addiction.  Routine labs were essentially normal.  UDS was positive for cocaine, benzodiazepines and THC. Negative for alcohol.    Seen today. Reports that she  is in good spirits. Not feeling depressed. Reports normal energy and interest. Has been maintaining normal biological functions. She is able to think clearly. She is able to focus on task. Her thoughts are  not crowded or racing. No evidence of mania. No hallucination in any modality. She is not making any delusional statement. No passivity of will/thought. She is fully in touch with reality. No thoughts of suicide. No thoughts of homicide. No violent thoughts. No overwhelming anxiety. No craving for substances, no access to weapons. She is looking forward to rehab.  Nursing staff reports that patient has been appropriate on the unit. Patient has been interacting well with peers. No behavioral issues. Patient has not voiced any suicidal thoughts. Patient has not been observed to be internally stimulated. Patient has been adherent with treatment recommendations. Patient has been tolerating their medication well.   Patient was discussed at team. Team members feels that patient is back to her baseline level of function. Team agrees with plan to discharge patient today.  Demographic Factors:  Caucasian and Unemployed  Loss Factors: Loss of significant relationship  Historical Factors: Impulsivity  Risk Reduction Factors:   Sense of responsibility to family, Living with another person, especially a relative, Positive therapeutic relationship and Positive coping skills or problem solving skills  Continued Clinical Symptoms:  As above   Cognitive Features That Contribute To Risk:  None    Suicide Risk:  Minimal: No identifiable suicidal ideation.  Patient is not having any thoughts of suicide at this time. Modifiable risk factors targeted during this admission includes depression and substance use. Demographical and historical risk factors cannot be modified. Patient is now engaging well. Patient is reliable and is future oriented. We have buffered patient's support structures. At this point, patient is at low risk of suicide. Patient is aware of the effects of psychoactive substances on decision making process. Patient has been provided with emergency contacts. Patient acknowledges to use resources  provided if unforseen circumstances changes their current risk stratification.    Follow-up Information    [facility] Follow up.   Specialty:  Addiction Medicine Why:  Social worker has made a referral on your behalf. Awaiting bed availability.  Contact information: [address] [place] [address] [phone]           Plan Of Care/Follow-up recommendations:  1. Continue current psychotropic medications 2. Mental health and addiction follow up as arranged.  3.  Provided limited quantity of prescriptions   Georgiann Cocker, MD 03/23/2017, 9:45 AM

## 2017-03-23 NOTE — Progress Notes (Signed)
  [facility] Adult Case Management Discharge Plan :  Will you be returning to the same living situation after discharge:  Yes,  [facility] at [facility] At discharge, do you have transportation home?: Yes,  [facility], arriing approx 1 PM today Do you have the ability to pay for your medications: No. Referred to provider who can assist  Release of information consent forms completed and turned in to Medical Records;  Patient to Follow up at: Follow-up Information    [facility] Follow up.   Specialty:  Addiction Medicine Why:  Social worker has made a referral on your behalf. Awaiting bed availability.  Contact information: [address] [place] [address] [phone]           Next level of care provider has access to [facility] Link:no  Safety Planning and Suicide Prevention discussed: Yes,  reviewd w patient  Have you used any form of tobacco in the last 30 days? (Cigarettes, Smokeless Tobacco, Cigars, and/or Pipes): Yes  Has patient been referred to the Quitline?: Patient refused referral  Patient has been referred for addiction treatment: Yes  Sallee Lange, LCSW 03/23/2017, 9:58 AM

## 2017-03-23 NOTE — [facility] Group Notes (Signed)
[facility] LCSW Group Therapy Note  03/23/2017 at 1:15 PM  Type of Therapy and Topic:  Group Therapy: Avoiding Old Behaviors and psycho education on Post Acute Withdrawal Syndrome  Participation Level:  Active  Affect:  Appropriate   Therapeutic models used: Cognitive Behavioral Therapy,  Person-Centered Therapy and Motivational Interviewing  Modes of Intervention:  Discussion, Exploration, Orientation, Rapport Building, Socialization and Support   Summary of Progress/Problems: Group session included an educational portion on Post Acute Withdrawal Syndrome (PAWS).  Patients were able to process the information and share feelings related to length of time recovery takes.  Pt processed triggers she might encounter during next three phases of her transition and was attentive to psycho education portion of group.   Carney Bern, LCSW

## 2017-03-23 NOTE — Progress Notes (Signed)
Recreation Therapy Notes  Date: 03/23/17 Time: 0930 Location: [address] Group Room  Group Topic: Stress Management  Goal Area(s) Addresses:  Patient will verbalize importance of using healthy stress management.  Patient will identify positive emotions associated with healthy stress management.   Intervention: Stress Management  Activity :  Meditation.  LRT introduced the concept of meditation to patients.  Patients were to listen and follow along as the meditation played to fully engage in the activity.  Education:  Stress Management, Discharge Planning.   Education Outcome: Acknowledges edcuation/In group clarification offered/Needs additional education  Clinical Observations/Feedback: Pt did not attend group.   Caroll Rancher, LRT/CTRS         Caroll Rancher A 03/23/2017 11:54 AM

## 2017-03-23 NOTE — [facility] Suicide Risk Assessment (Addendum)
[facility] INPATIENT:  Family/Significant Other Suicide Prevention Education  Suicide Prevention Education:  Contact Attempts: husband, (name of family member/significant other) has been identified by the patient as the family member/significant other with whom the patient will be residing, and identified as the person(s) who will aid the patient in the event of a mental health crisis.  With written consent from the patient, two attempts were made to provide suicide prevention education, prior to and/or following the patient's discharge.  We were unsuccessful in providing suicide prevention education.  A suicide education pamphlet was given to the patient to share with family/significant other.  Per notes from CSW Smart, attempts were made to contact husband and were unsuccessful; undersigned CSW also tried to contact husband however phone # was not working.  SPE completed w patient.  Sallee Lange 03/23/2017, 10:00 AM

## 2017-03-23 NOTE — [facility] Group Notes (Signed)

## 2017-03-23 NOTE — Progress Notes (Addendum)
Discharge Note:  Patient discharged to facility.  Patient denied SI and HI.  Denied A/V hallucinations.  Denied pain.  Suicide prevention information given and discussed with patient who stated she understood and had no questions.  Patient stated she received all her belongings, clothing, toiletries, misc items, prescriptions, etc.  All required discharge information given to patient as required.  Patient stated she appreciated all assistance received at [facility].
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
## 2017-03-23 NOTE — Social Work (Signed)
Message to left for [facility] to inquire about bed availability.  Santa Genera, LCSW Lead Clinical Social Worker Phone:  [phone]

## 2017-03-23 NOTE — Social Work (Signed)
Phone call from [facility] - confirms they will pick up patient today approx 2 PM.  Questions, pls call Britta Mccreedy at [phone].  Santa Genera, LCSW Lead Clinical Social Worker Phone:  [phone]

## 2017-03-23 NOTE — Progress Notes (Signed)
D:  Patient's self inventory sheet, patient sleeps good, sleep medication helpful.  Good appetite, normal energy level, good concentration.  Rated depression and anxiety 4, hopeless 1.  Denied withdrawals.  Denied SI.  Physical problems, pain, worst pain #4, legs, left foot, right knee, lower back pain.  Pain medication helpful.  Goal is going to [facility] or going to my family members.  Plans to call family.  All staff members are wonderful!  Does have discharge plans. A:  Medications administered per MD orders.  Emotional support and encouragement given patient. R:  Denied SI and HI, contracts for safety.  Denied A/V hallucinations.  Safety maintained with 15 minute checks.

## 2017-06-01 LAB — HEPATITIS B SURFACE ANTIGEN

## 2017-09-13 ENCOUNTER — Inpatient Hospital Stay (HOSPITAL_COMMUNITY)
Admission: AD | Admit: 2017-09-13 | Discharge: 2017-09-20 | DRG: 885 | Payer: No Typology Code available for payment source | Source: Intra-hospital | Attending: Psychiatry | Admitting: Psychiatry

## 2017-09-13 ENCOUNTER — Encounter (HOSPITAL_COMMUNITY): Payer: Self-pay | Admitting: *Deleted

## 2017-09-13 ENCOUNTER — Emergency Department (HOSPITAL_COMMUNITY)
Admission: EM | Admit: 2017-09-13 | Discharge: 2017-09-13 | Disposition: A | Discharge status: Other Acute Inpatient Hospital | Payer: Self-pay | Attending: Emergency Medicine | Admitting: Emergency Medicine

## 2017-09-13 ENCOUNTER — Other Ambulatory Visit: Payer: Self-pay

## 2017-09-13 DIAGNOSIS — J45909 Unspecified asthma, uncomplicated: Secondary | ICD-10-CM | POA: Insufficient documentation

## 2017-09-13 DIAGNOSIS — F1490 Cocaine use, unspecified, uncomplicated: Secondary | ICD-10-CM | POA: Diagnosis not present

## 2017-09-13 DIAGNOSIS — K5909 Other constipation: Secondary | ICD-10-CM | POA: Diagnosis present

## 2017-09-13 DIAGNOSIS — F1110 Opioid abuse, uncomplicated: Secondary | ICD-10-CM | POA: Insufficient documentation

## 2017-09-13 DIAGNOSIS — F333 Major depressive disorder, recurrent, severe with psychotic symptoms: Secondary | ICD-10-CM | POA: Diagnosis present

## 2017-09-13 DIAGNOSIS — F332 Major depressive disorder, recurrent severe without psychotic features: Secondary | ICD-10-CM | POA: Insufficient documentation

## 2017-09-13 DIAGNOSIS — G4700 Insomnia, unspecified: Secondary | ICD-10-CM | POA: Diagnosis present

## 2017-09-13 DIAGNOSIS — F13239 Sedative, hypnotic or anxiolytic dependence with withdrawal, unspecified: Secondary | ICD-10-CM | POA: Diagnosis present

## 2017-09-13 DIAGNOSIS — K219 Gastro-esophageal reflux disease without esophagitis: Secondary | ICD-10-CM | POA: Diagnosis present

## 2017-09-13 DIAGNOSIS — Z915 Personal history of self-harm: Secondary | ICD-10-CM | POA: Diagnosis not present

## 2017-09-13 DIAGNOSIS — F1423 Cocaine dependence with withdrawal: Secondary | ICD-10-CM | POA: Diagnosis present

## 2017-09-13 DIAGNOSIS — F17213 Nicotine dependence, cigarettes, with withdrawal: Secondary | ICD-10-CM | POA: Diagnosis present

## 2017-09-13 DIAGNOSIS — F1910 Other psychoactive substance abuse, uncomplicated: Secondary | ICD-10-CM | POA: Diagnosis not present

## 2017-09-13 DIAGNOSIS — F1290 Cannabis use, unspecified, uncomplicated: Secondary | ICD-10-CM | POA: Diagnosis not present

## 2017-09-13 DIAGNOSIS — R45851 Suicidal ideations: Secondary | ICD-10-CM | POA: Diagnosis present

## 2017-09-13 DIAGNOSIS — F329 Major depressive disorder, single episode, unspecified: Secondary | ICD-10-CM | POA: Diagnosis present

## 2017-09-13 DIAGNOSIS — F19288 Other psychoactive substance dependence with other psychoactive substance-induced disorder: Secondary | ICD-10-CM | POA: Diagnosis not present

## 2017-09-13 DIAGNOSIS — F419 Anxiety disorder, unspecified: Secondary | ICD-10-CM | POA: Diagnosis present

## 2017-09-13 DIAGNOSIS — F1120 Opioid dependence, uncomplicated: Secondary | ICD-10-CM | POA: Diagnosis present

## 2017-09-13 DIAGNOSIS — F17210 Nicotine dependence, cigarettes, uncomplicated: Secondary | ICD-10-CM | POA: Diagnosis not present

## 2017-09-13 DIAGNOSIS — F322 Major depressive disorder, single episode, severe without psychotic features: Secondary | ICD-10-CM | POA: Insufficient documentation

## 2017-09-13 LAB — COMPREHENSIVE METABOLIC PANEL
ALBUMIN: 3.9 g/dL (ref 3.5–5.0)
ALK PHOS: 85 U/L (ref 38–126)
ALT: 34 U/L (ref 14–54)
AST: 31 U/L (ref 15–41)
Anion gap: 9 (ref 5–15)
BILIRUBIN TOTAL: 1.1 mg/dL (ref 0.3–1.2)
BUN: 16 mg/dL (ref 6–20)
CALCIUM: 9.4 mg/dL (ref 8.9–10.3)
CO2: 26 mmol/L (ref 22–32)
Chloride: 104 mmol/L (ref 101–111)
Creatinine, Ser: 0.62 mg/dL (ref 0.44–1.00)
GFR calc Af Amer: >60 mL/min (ref >60)
GFR calc non Af Amer: >60 mL/min (ref >60)
GLUCOSE: 114 mg/dL — AB (ref 65–99)
Potassium: 3.4 mmol/L — ABNORMAL LOW (ref 3.5–5.1)
SODIUM: 139 mmol/L (ref 135–145)
TOTAL PROTEIN: 7.3 g/dL (ref 6.5–8.1)

## 2017-09-13 LAB — CBC
HEMATOCRIT: 43.1 % (ref 36.0–46.0)
HEMOGLOBIN: 14.6 g/dL (ref 12.0–15.0)
MCH: 31.7 pg (ref 26.0–34.0)
MCHC: 33.9 g/dL (ref 30.0–36.0)
MCV: 93.5 fL (ref 78.0–100.0)
Platelets: 284 10*3/uL (ref 150–400)
RBC: 4.61 MIL/uL (ref 3.87–5.11)
RDW: 13.5 % (ref 11.5–15.5)
WBC: 10.6 10*3/uL — ABNORMAL HIGH (ref 4.0–10.5)

## 2017-09-13 LAB — ACETAMINOPHEN LEVEL

## 2017-09-13 LAB — SALICYLATE LEVEL: Salicylate Lvl: <7 mg/dL (ref 2.8–30.0)

## 2017-09-13 LAB — I-STAT BETA HCG BLOOD, ED (MC, WL, AP ONLY)

## 2017-09-13 LAB — RAPID URINE DRUG SCREEN, HOSP PERFORMED
Amphetamines: NOT DETECTED
BARBITURATES: NOT DETECTED
BENZODIAZEPINES: POSITIVE — AB
COCAINE: POSITIVE — AB
Opiates: POSITIVE — AB
TETRAHYDROCANNABINOL: POSITIVE — AB

## 2017-09-13 LAB — ETHANOL: Alcohol, Ethyl (B): <10 mg/dL (ref <10)

## 2017-09-13 MED ORDER — ADULT MULTIVITAMIN W/MINERALS CH
1.0000 | ORAL_TABLET | Freq: Every day | ORAL | Status: DC
  Administered 2017-09-14 – 2017-09-20 (×7): 1 via ORAL
  Filled 2017-09-13 (×10): qty 1

## 2017-09-13 MED ORDER — ENSURE ENLIVE PO LIQD
237.0000 mL | Freq: Two times a day (BID) | ORAL | Status: DC
  Administered 2017-09-13 – 2017-09-15 (×4): 237 mL via ORAL

## 2017-09-13 MED ORDER — NICOTINE 21 MG/24HR TD PT24
21.0000 mg | MEDICATED_PATCH | Freq: Every day | TRANSDERMAL | Status: DC
  Administered 2017-09-13 – 2017-09-20 (×8): 21 mg via TRANSDERMAL
  Filled 2017-09-13: qty 1
  Filled 2017-09-13 (×2): qty 14
  Filled 2017-09-13 (×8): qty 1

## 2017-09-13 MED ORDER — CLONIDINE HCL 0.1 MG PO TABS
0.1000 mg | ORAL_TABLET | ORAL | Status: AC
  Administered 2017-09-15 – 2017-09-16 (×4): 0.1 mg via ORAL
  Filled 2017-09-13 (×4): qty 1

## 2017-09-13 MED ORDER — LOPERAMIDE HCL 2 MG PO CAPS: 2.0000 mg | ORAL_CAPSULE | ORAL | Status: DC | PRN

## 2017-09-13 MED ORDER — CHLORDIAZEPOXIDE HCL 25 MG PO CAPS: 25.0000 mg | ORAL_CAPSULE | Freq: Three times a day (TID) | ORAL | Status: DC

## 2017-09-13 MED ORDER — ALBUTEROL SULFATE HFA 108 (90 BASE) MCG/ACT IN AERS
1.0000 | INHALATION_SPRAY | Freq: Four times a day (QID) | RESPIRATORY_TRACT | Status: DC | PRN
  Administered 2017-09-14 – 2017-09-20 (×9): 2 via RESPIRATORY_TRACT
  Filled 2017-09-13 (×2): qty 6.7

## 2017-09-13 MED ORDER — CLONIDINE HCL 0.1 MG PO TABS: 0.1000 mg | ORAL_TABLET | Freq: Every day | ORAL | Status: DC

## 2017-09-13 MED ORDER — METHOCARBAMOL 500 MG PO TABS
500.0000 mg | ORAL_TABLET | Freq: Three times a day (TID) | ORAL | Status: DC | PRN
  Administered 2017-09-13: 500 mg via ORAL
  Filled 2017-09-13: qty 1

## 2017-09-13 MED ORDER — TRAZODONE HCL 50 MG PO TABS
50.0000 mg | ORAL_TABLET | Freq: Every evening | ORAL | Status: DC | PRN
  Administered 2017-09-13: 50 mg via ORAL
  Filled 2017-09-13: qty 1

## 2017-09-13 MED ORDER — LOPERAMIDE HCL 2 MG PO CAPS
2.0000 mg | ORAL_CAPSULE | ORAL | Status: AC | PRN
  Administered 2017-09-13: 4 mg via ORAL
  Filled 2017-09-13: qty 2

## 2017-09-13 MED ORDER — CHLORDIAZEPOXIDE HCL 25 MG PO CAPS: 25.0000 mg | ORAL_CAPSULE | Freq: Four times a day (QID) | ORAL | Status: DC | PRN

## 2017-09-13 MED ORDER — ACETAMINOPHEN 325 MG PO TABS
650.0000 mg | ORAL_TABLET | Freq: Four times a day (QID) | ORAL | Status: DC | PRN
  Administered 2017-09-18: 650 mg via ORAL
  Filled 2017-09-13: qty 2

## 2017-09-13 MED ORDER — CHLORDIAZEPOXIDE HCL 25 MG PO CAPS
25.0000 mg | ORAL_CAPSULE | Freq: Three times a day (TID) | ORAL | Status: AC
  Administered 2017-09-14 (×3): 25 mg via ORAL
  Filled 2017-09-13 (×3): qty 1

## 2017-09-13 MED ORDER — CHLORDIAZEPOXIDE HCL 25 MG PO CAPS
25.0000 mg | ORAL_CAPSULE | Freq: Four times a day (QID) | ORAL | Status: DC
  Administered 2017-09-13: 25 mg via ORAL
  Filled 2017-09-13: qty 1

## 2017-09-13 MED ORDER — HYDROXYZINE HCL 25 MG PO TABS
25.0000 mg | ORAL_TABLET | Freq: Three times a day (TID) | ORAL | Status: DC | PRN
  Administered 2017-09-13: 25 mg via ORAL
  Filled 2017-09-13: qty 1
  Filled 2017-09-13 (×2): qty 20

## 2017-09-13 MED ORDER — TRAZODONE HCL 50 MG PO TABS: 50.0000 mg | ORAL_TABLET | Freq: Every evening | ORAL | Status: DC | PRN

## 2017-09-13 MED ORDER — THIAMINE HCL 100 MG/ML IJ SOLN: 100.0000 mg | Freq: Once | INTRAMUSCULAR | Status: DC

## 2017-09-13 MED ORDER — NAPROXEN 500 MG PO TABS
500.0000 mg | ORAL_TABLET | Freq: Two times a day (BID) | ORAL | Status: AC | PRN
  Administered 2017-09-13 (×2): 500 mg via ORAL
  Filled 2017-09-13 (×2): qty 1

## 2017-09-13 MED ORDER — MAGNESIUM HYDROXIDE 400 MG/5ML PO SUSP
30.0000 mL | Freq: Every day | ORAL | Status: DC | PRN
  Administered 2017-09-17: 30 mL via ORAL
  Filled 2017-09-13: qty 30

## 2017-09-13 MED ORDER — CLONIDINE HCL 0.1 MG PO TABS: 0.1000 mg | ORAL_TABLET | ORAL | Status: DC

## 2017-09-13 MED ORDER — DICYCLOMINE HCL 20 MG PO TABS: 20.0000 mg | ORAL_TABLET | Freq: Four times a day (QID) | ORAL | Status: AC | PRN

## 2017-09-13 MED ORDER — ONDANSETRON 4 MG PO TBDP
4.0000 mg | ORAL_TABLET | Freq: Four times a day (QID) | ORAL | Status: AC | PRN
  Administered 2017-09-14 – 2017-09-15 (×2): 4 mg via ORAL
  Filled 2017-09-13 (×2): qty 1

## 2017-09-13 MED ORDER — CHLORDIAZEPOXIDE HCL 25 MG PO CAPS
25.0000 mg | ORAL_CAPSULE | Freq: Four times a day (QID) | ORAL | Status: AC
  Administered 2017-09-13 (×3): 25 mg via ORAL
  Filled 2017-09-13 (×3): qty 1

## 2017-09-13 MED ORDER — HYDROXYZINE HCL 25 MG PO TABS
25.0000 mg | ORAL_TABLET | Freq: Three times a day (TID) | ORAL | Status: DC | PRN
  Administered 2017-09-13 (×2): 25 mg via ORAL
  Filled 2017-09-13 (×2): qty 1

## 2017-09-13 MED ORDER — CHLORDIAZEPOXIDE HCL 25 MG PO CAPS: 25.0000 mg | ORAL_CAPSULE | ORAL | Status: DC

## 2017-09-13 MED ORDER — ONDANSETRON 4 MG PO TBDP
4.0000 mg | ORAL_TABLET | Freq: Four times a day (QID) | ORAL | Status: DC | PRN
  Administered 2017-09-13: 4 mg via ORAL
  Filled 2017-09-13: qty 1

## 2017-09-13 MED ORDER — CLONIDINE HCL 0.1 MG PO TABS
0.1000 mg | ORAL_TABLET | Freq: Four times a day (QID) | ORAL | Status: AC
  Administered 2017-09-13 – 2017-09-14 (×6): 0.1 mg via ORAL
  Filled 2017-09-13 (×8): qty 1

## 2017-09-13 MED ORDER — LINACLOTIDE 145 MCG PO CAPS
145.0000 ug | ORAL_CAPSULE | Freq: Every day | ORAL | Status: DC
  Administered 2017-09-13: 145 ug via ORAL
  Filled 2017-09-13: qty 1

## 2017-09-13 MED ORDER — ALUM & MAG HYDROXIDE-SIMETH 200-200-20 MG/5ML PO SUSP: 30.0000 mL | ORAL | Status: DC | PRN

## 2017-09-13 MED ORDER — CHLORDIAZEPOXIDE HCL 25 MG PO CAPS: 25.0000 mg | ORAL_CAPSULE | Freq: Every day | ORAL | Status: DC

## 2017-09-13 MED ORDER — VITAMIN B-1 100 MG PO TABS
100.0000 mg | ORAL_TABLET | Freq: Every day | ORAL | Status: DC
  Administered 2017-09-14 – 2017-09-20 (×7): 100 mg via ORAL
  Filled 2017-09-13 (×10): qty 1

## 2017-09-13 MED ORDER — TRAZODONE HCL 50 MG PO TABS
50.0000 mg | ORAL_TABLET | Freq: Every evening | ORAL | Status: DC | PRN
  Administered 2017-09-13 – 2017-09-14 (×2): 50 mg via ORAL
  Filled 2017-09-13 (×6): qty 1

## 2017-09-13 MED ORDER — CLONIDINE HCL 0.1 MG PO TABS
0.1000 mg | ORAL_TABLET | Freq: Four times a day (QID) | ORAL | Status: DC
  Administered 2017-09-13: 0.1 mg via ORAL
  Filled 2017-09-13: qty 1

## 2017-09-13 MED ORDER — NAPROXEN 500 MG PO TABS: 500.0000 mg | ORAL_TABLET | Freq: Two times a day (BID) | ORAL | Status: DC | PRN

## 2017-09-13 MED ORDER — CHLORDIAZEPOXIDE HCL 25 MG PO CAPS: 25.0000 mg | ORAL_CAPSULE | Freq: Four times a day (QID) | ORAL | Status: AC | PRN

## 2017-09-13 MED ORDER — METHOCARBAMOL 500 MG PO TABS
500.0000 mg | ORAL_TABLET | Freq: Three times a day (TID) | ORAL | Status: AC | PRN
  Administered 2017-09-13 – 2017-09-18 (×8): 500 mg via ORAL
  Filled 2017-09-13 (×9): qty 1

## 2017-09-13 MED ORDER — ADULT MULTIVITAMIN W/MINERALS CH
1.0000 | ORAL_TABLET | Freq: Every day | ORAL | Status: DC
  Administered 2017-09-13: 1 via ORAL
  Filled 2017-09-13: qty 1

## 2017-09-13 MED ORDER — VITAMIN B-1 100 MG PO TABS
100.0000 mg | ORAL_TABLET | Freq: Every day | ORAL | Status: DC
  Administered 2017-09-13: 100 mg via ORAL
  Filled 2017-09-13: qty 1

## 2017-09-13 MED ORDER — ALBUTEROL SULFATE HFA 108 (90 BASE) MCG/ACT IN AERS
1.0000 | INHALATION_SPRAY | Freq: Four times a day (QID) | RESPIRATORY_TRACT | Status: DC | PRN
Start: 2017-09-13 — End: 2017-09-13

## 2017-09-13 MED ORDER — CHLORDIAZEPOXIDE HCL 25 MG PO CAPS
25.0000 mg | ORAL_CAPSULE | ORAL | Status: AC
  Administered 2017-09-15 (×2): 25 mg via ORAL
  Filled 2017-09-13 (×2): qty 1

## 2017-09-13 MED ORDER — LINACLOTIDE 145 MCG PO CAPS
145.0000 ug | ORAL_CAPSULE | Freq: Every day | ORAL | Status: DC
  Administered 2017-09-14 – 2017-09-20 (×7): 145 ug via ORAL
  Filled 2017-09-13 (×4): qty 1
  Filled 2017-09-13: qty 14
  Filled 2017-09-13 (×3): qty 1
  Filled 2017-09-13: qty 14
  Filled 2017-09-13: qty 1

## 2017-09-13 MED ORDER — DICYCLOMINE HCL 20 MG PO TABS
20.0000 mg | ORAL_TABLET | Freq: Four times a day (QID) | ORAL | Status: DC | PRN
  Administered 2017-09-13: 20 mg via ORAL
  Filled 2017-09-13: qty 1

## 2017-09-13 MED ORDER — CHLORDIAZEPOXIDE HCL 25 MG PO CAPS
25.0000 mg | ORAL_CAPSULE | Freq: Every day | ORAL | Status: AC
  Administered 2017-09-16: 25 mg via ORAL
  Filled 2017-09-13: qty 1

## 2017-09-13 MED ORDER — CLONIDINE HCL 0.1 MG PO TABS
0.1000 mg | ORAL_TABLET | Freq: Every day | ORAL | Status: AC
  Administered 2017-09-17 – 2017-09-18 (×2): 0.1 mg via ORAL
  Filled 2017-09-13 (×2): qty 1

## 2017-09-13 NOTE — [facility] Group Notes (Signed)
Adult Psychoeducational Group Note  Date:  09/13/2017 Time:  8:56 PM  Group Topic/Focus:  Wrap-Up Group:   The focus of this group is to help patients review their daily goal of treatment and discuss progress on daily workbooks.  Participation Level:  Active  Participation Quality:  Appropriate and Attentive  Affect:  Tearful  Cognitive:  Alert and Appropriate  Insight: Appropriate and Good  Engagement in Group:  Engaged  Modes of Intervention:  Discussion and Education  Additional Comments:  Pt attended and participated in wrap up group this evening. Pt rated their day a 2/3 out of 10 due to them withdrawing really bad. Pt goal is to eat better and they are happy to be here.   Charlotte NettersOctavia A Ivelis Norgard 09/13/2017, 8:56 PM

## 2017-09-13 NOTE — Progress Notes (Signed)
Charlotte Strong is a 42 year old female pt admitted on voluntary basis. On admission, Charlotte Strong is fidgety and restless and appears to be withdrawing on admission. She endorses that she has been using heroin daily for the past 3 months and reports that she has been taking xanax as well. She denies any SI on admission and is able to contract for safety while in the hospital. She reports that she was here a few months ago and spoke about how she did not get her medications filled and reports that she is not on any medications currently. She reports that she was staying with a friend and may be able to go back there. Charlotte Strong was oriented to the unit and safety maintained.

## 2017-09-13 NOTE — Progress Notes (Signed)
Per Donell SievertSpencer Simon, PA pt is recommended for inpt treatment. EDP Felipa FurnaceBrowning, Robert, PA-C and pt's nurse Rimando, Junior Caryn Sectionesar M, RN have been advised. [facility] is currently reviewing for possible admission.  Princess BruinsAquicha Aretta Stetzel, MSW, LCSW Therapeutic Triage Specialist  [phone]

## 2017-09-13 NOTE — ED Triage Notes (Signed)
Patient stated she is been thinking on hurting herself over a week now. Patient stated she's planning to overdose herself with xanax and heroin. Patient stated she just got separated with her husband and kids this January 2019.

## 2017-09-13 NOTE — Progress Notes (Signed)
[facility] Group Notes:  (Nursing/MHT/Case Management/Adjunct)  Date:  09/13/2017  Time:  1600 Type of Therapy:  Nurse Education-Aromatherapy   Participation Level: Did not attend.    Conducted by Jan. Wright, RN.   

## 2017-09-13 NOTE — ED Notes (Signed)
[facility]  [facility] called for transport.

## 2017-09-13 NOTE — Tx Team (Signed)
Initial Treatment Plan 09/13/2017 2:41 PM Lowanda Christella HartiganJacobs ZOX:[id number]    PATIENT STRESSORS: Medication change or noncompliance Substance abuse   PATIENT STRENGTHS: Ability for insight Average or above average intelligence Capable of independent living General fund of knowledge   PATIENT IDENTIFIED PROBLEMS: Depression Substance abuse Suicidal thoughts "My addiction"                     DISCHARGE CRITERIA:  Ability to meet basic life and health needs Improved stabilization in mood, thinking, and/or behavior Verbal commitment to aftercare and medication compliance Withdrawal symptoms are absent or subacute and managed without 24-hour nursing intervention  PRELIMINARY DISCHARGE PLAN: Attend aftercare/continuing care group  PATIENT/FAMILY INVOLVEMENT: This treatment plan has been presented to and reviewed with the patient, Renato Gailsrystal Lorio, and/or family member, .  The patient and family have been given the opportunity to ask questions and make suggestions.  Ronisha Herringshaw, [facility] [place], [place] 09/13/2017, 2:41 PM
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
## 2017-09-13 NOTE — BH Assessment (Signed)
[facility]  Per Juanetta BeetsJacqueline Norman, DO, this pt requires psychiatric hospitalization at this time.  Berneice Heinrichina Tate, RN, [facility]; [facility] will be ready to receive pt at 14:00.  Pt has signed Voluntary Admission and Consent for Treatment, as well as Consent to Release Information to no one, and signed [facility].  Pt's nurse, Donnal DebarRandi, has been notified, and agrees to send original paperwork along with pt via [facility], and to call report to [phone].  Doylene Canninghomas Makilah Dowda, [place] Behavioral Health Coordinator [phone]
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
## 2017-09-14 DIAGNOSIS — F19288 Other psychoactive substance dependence with other psychoactive substance-induced disorder: Secondary | ICD-10-CM

## 2017-09-14 DIAGNOSIS — F333 Major depressive disorder, recurrent, severe with psychotic symptoms: Principal | ICD-10-CM

## 2017-09-14 DIAGNOSIS — Z811 Family history of alcohol abuse and dependence: Secondary | ICD-10-CM

## 2017-09-14 DIAGNOSIS — F17210 Nicotine dependence, cigarettes, uncomplicated: Secondary | ICD-10-CM

## 2017-09-14 DIAGNOSIS — Z62810 Personal history of physical and sexual abuse in childhood: Secondary | ICD-10-CM

## 2017-09-14 DIAGNOSIS — F1490 Cocaine use, unspecified, uncomplicated: Secondary | ICD-10-CM

## 2017-09-14 DIAGNOSIS — F419 Anxiety disorder, unspecified: Secondary | ICD-10-CM

## 2017-09-14 DIAGNOSIS — Z62811 Personal history of psychological abuse in childhood: Secondary | ICD-10-CM

## 2017-09-14 DIAGNOSIS — F1190 Opioid use, unspecified, uncomplicated: Secondary | ICD-10-CM

## 2017-09-14 DIAGNOSIS — R450 Nervousness: Secondary | ICD-10-CM

## 2017-09-14 DIAGNOSIS — Z818 Family history of other mental and behavioral disorders: Secondary | ICD-10-CM

## 2017-09-14 DIAGNOSIS — G4700 Insomnia, unspecified: Secondary | ICD-10-CM

## 2017-09-14 DIAGNOSIS — F1290 Cannabis use, unspecified, uncomplicated: Secondary | ICD-10-CM

## 2017-09-14 MED ORDER — OLANZAPINE 5 MG PO TABS
5.0000 mg | ORAL_TABLET | Freq: Every day | ORAL | Status: DC
  Administered 2017-09-14 – 2017-09-19 (×6): 5 mg via ORAL
  Filled 2017-09-14 (×2): qty 1
  Filled 2017-09-14: qty 14
  Filled 2017-09-14 (×2): qty 1
  Filled 2017-09-14: qty 14
  Filled 2017-09-14 (×2): qty 1

## 2017-09-14 MED ORDER — GABAPENTIN 100 MG PO CAPS
100.0000 mg | ORAL_CAPSULE | Freq: Three times a day (TID) | ORAL | Status: DC
  Administered 2017-09-14 – 2017-09-20 (×19): 100 mg via ORAL
  Filled 2017-09-14: qty 1
  Filled 2017-09-14: qty 42
  Filled 2017-09-14: qty 1
  Filled 2017-09-14: qty 42
  Filled 2017-09-14 (×3): qty 1
  Filled 2017-09-14: qty 42
  Filled 2017-09-14 (×5): qty 1
  Filled 2017-09-14: qty 42
  Filled 2017-09-14 (×2): qty 1
  Filled 2017-09-14: qty 42
  Filled 2017-09-14 (×5): qty 1
  Filled 2017-09-14: qty 42
  Filled 2017-09-14 (×3): qty 1

## 2017-09-14 MED ORDER — VENLAFAXINE HCL ER 75 MG PO CP24
75.0000 mg | ORAL_CAPSULE | Freq: Every day | ORAL | Status: DC
  Administered 2017-09-14 – 2017-09-20 (×7): 75 mg via ORAL
  Filled 2017-09-14 (×2): qty 1
  Filled 2017-09-14: qty 14
  Filled 2017-09-14 (×3): qty 1
  Filled 2017-09-14: qty 14
  Filled 2017-09-14 (×2): qty 1

## 2017-09-14 NOTE — Tx Team (Signed)
Interdisciplinary Treatment and Diagnostic Plan Update  09/14/2017 Time of Session: [place] MRN: [id number]  Principal Diagnosis: MDD, severe, without psychosis  Secondary Diagnoses: Active Problems:   Major depressive disorder, single episode, severe without psychosis ([name])   Current Medications:  Current Facility-Administered Medications  Medication Dose Route Frequency Provider Last Rate Last Dose  . acetaminophen (TYLENOL) tablet 650 mg  650 mg Oral Q6H PRN Patrecia Pour, NP      . albuterol (PROVENTIL HFA;VENTOLIN HFA) 108 (90 Base) MCG/ACT inhaler 1-2 puff  1-2 puff Inhalation Q6H PRN Patrecia Pour, NP      . alum & mag hydroxide-simeth (MAALOX/MYLANTA) 200-200-20 MG/5ML suspension 30 mL  30 mL Oral Q4H PRN Patrecia Pour, NP      . chlordiazePOXIDE (LIBRIUM) capsule 25 mg  25 mg Oral Q6H PRN Patrecia Pour, NP      . chlordiazePOXIDE (LIBRIUM) capsule 25 mg  25 mg Oral TID Patrecia Pour, NP   25 mg at 09/14/17 7124   Followed by  . [START ON 09/15/2017] chlordiazePOXIDE (LIBRIUM) capsule 25 mg  25 mg Oral BH-qamhs Patrecia Pour, NP       Followed by  . [START ON 09/16/2017] chlordiazePOXIDE (LIBRIUM) capsule 25 mg  25 mg Oral Daily Waylan Boga Y, NP      . cloNIDine (CATAPRES) tablet 0.1 mg  0.1 mg Oral QID Patrecia Pour, NP   0.1 mg at 09/14/17 5809   Followed by  . [START ON 09/15/2017] cloNIDine (CATAPRES) tablet 0.1 mg  0.1 mg Oral BH-qamhs Lord, Asa Saunas, NP       Followed by  . [START ON 09/17/2017] cloNIDine (CATAPRES) tablet 0.1 mg  0.1 mg Oral QAC breakfast Patrecia Pour, NP      . dicyclomine (BENTYL) tablet 20 mg  20 mg Oral Q6H PRN Patrecia Pour, NP      . feeding supplement (ENSURE ENLIVE) (ENSURE ENLIVE) liquid 237 mL  237 mL Oral BID BM Cobos, Myer Peer, MD   237 mL at 09/13/17 1509  . hydrOXYzine (ATARAX/VISTARIL) tablet 25 mg  25 mg Oral TID PRN Patrecia Pour, NP   25 mg at 09/13/17 2048  . linaclotide (LINZESS) capsule 145 mcg  145  mcg Oral Daily Waylan Boga Y, NP      . loperamide (IMODIUM) capsule 2-4 mg  2-4 mg Oral PRN Patrecia Pour, NP   4 mg at 09/13/17 2048  . magnesium hydroxide (MILK OF MAGNESIA) suspension 30 mL  30 mL Oral Daily PRN Patrecia Pour, NP      . methocarbamol (ROBAXIN) tablet 500 mg  500 mg Oral Q8H PRN Patrecia Pour, NP   500 mg at 09/13/17 2048  . multivitamin with minerals tablet 1 tablet  1 tablet Oral Daily Patrecia Pour, NP   1 tablet at 09/14/17 9833  . naproxen (NAPROSYN) tablet 500 mg  500 mg Oral BID PRN Patrecia Pour, NP   500 mg at 09/13/17 2048  . nicotine (NICODERM CQ - dosed in mg/24 hours) patch 21 mg  21 mg Transdermal Daily Cobos, Myer Peer, MD   21 mg at 09/14/17 0829  . ondansetron (ZOFRAN-ODT) disintegrating tablet 4 mg  4 mg Oral Q6H PRN Patrecia Pour, NP      . thiamine (B-1) injection 100 mg  100 mg Intramuscular Once Patrecia Pour, NP      . thiamine (VITAMIN B-1) tablet 100 mg  100 mg Oral Daily Patrecia Pour, NP   100 mg at 09/14/17 3474  . traZODone (DESYREL) tablet 50 mg  50 mg Oral QHS,MR X 1 Lindon Romp A, NP   50 mg at 09/13/17 2248   PTA Medications: Medications Prior to Admission  Medication Sig Dispense Refill Last Dose  . albuterol (PROVENTIL HFA;VENTOLIN HFA) 108 (90 Base) MCG/ACT inhaler Inhale 1-2 puffs into the lungs every 6 (six) hours as needed for wheezing or shortness of breath.   Past Month at Unknown time  . gabapentin (NEURONTIN) 100 MG capsule Take 2 capsules (200 mg total) by mouth 3 (three) times daily. For agitation (Patient not taking: Reported on 09/13/2017) 90 capsule 0 Not Taking at Unknown time  . hydrOXYzine (ATARAX/VISTARIL) 25 MG tablet Take 1 tablet (25 mg total) by mouth 3 (three) times daily as needed for anxiety. (Patient not taking: Reported on 09/13/2017) 60 tablet 0 Not Taking at Unknown time  . linaclotide (LINZESS) 145 MCG CAPS capsule Take 1 capsule (145 mcg total) by mouth daily as needed (constipation). (Patient not  taking: Reported on 09/13/2017) 10 capsule 0 Not Taking at Unknown time  . metroNIDAZOLE (FLAGYL) 500 MG tablet Take 1 tablet (500 mg total) by mouth every 12 (twelve) hours. For infection (Patient not taking: Reported on 09/13/2017)   Not Taking at Unknown time  . nicotine polacrilex (NICORETTE) 2 MG gum Take 1 each (2 mg total) by mouth as needed for smoking cessation. (May buy from over the counter): (Patient not taking: Reported on 09/13/2017) 100 tablet 0 Not Taking at Unknown time  . traZODone (DESYREL) 50 MG tablet Take 1 tablet (50 mg total) by mouth at bedtime as needed for sleep. (Patient not taking: Reported on 09/13/2017) 30 tablet 0 Not Taking at Unknown time  . venlafaxine XR (EFFEXOR-XR) 75 MG 24 hr capsule Take 1 capsule (75 mg total) by mouth daily with breakfast. For depression (Patient not taking: Reported on 09/13/2017) 30 capsule 0 Not Taking at Unknown time    Patient Stressors: Medication change or noncompliance Substance abuse  Patient Strengths: Ability for insight Average or above average intelligence Capable of independent living General fund of knowledge  Treatment Modalities: Medication Management, Group therapy, Case management,  1 to 1 session with clinician, Psychoeducation, Recreational therapy.   Physician Treatment Plan for Primary Diagnosis: MDD, severe, without psychosis  Medication Management: Evaluate patient's response, side effects, and tolerance of medication regimen.  Therapeutic Interventions: 1 to 1 sessions, Unit Group sessions and Medication administration.  Evaluation of Outcomes: Not Met  Physician Treatment Plan for Secondary Diagnosis: Active Problems:   Major depressive disorder, single episode, severe without psychosis ([name])   Medication Management: Evaluate patient's response, side effects, and tolerance of medication regimen.  Therapeutic Interventions: 1 to 1 sessions, Unit Group sessions and Medication administration.  Evaluation of  Outcomes: Not Met   RN Treatment Plan for Primary Diagnosis: MDD, severe, without psychosis Long Term Goal(s): Knowledge of disease and therapeutic regimen to maintain health will improve  Short Term Goals: Ability to remain free from injury will improve, Ability to disclose and discuss suicidal ideas and Ability to identify and develop effective coping behaviors will improve  Medication Management: RN will administer medications as ordered by provider, will assess and evaluate patient's response and provide education to patient for prescribed medication. RN will report any adverse and/or side effects to prescribing provider.  Therapeutic Interventions: 1 on 1 counseling sessions, Psychoeducation, Medication administration, Evaluate responses to treatment, Monitor  vital signs and CBGs as ordered, Perform/monitor CIWA, COWS, AIMS and Fall Risk screenings as ordered, Perform wound care treatments as ordered.  Evaluation of Outcomes: Not Met   LCSW Treatment Plan for Primary Diagnosis: MDD, severe, without psychosis Long Term Goal(s): Safe transition to appropriate next level of care at discharge, Engage patient in therapeutic group addressing interpersonal concerns.  Short Term Goals: Engage patient in aftercare planning with referrals and resources, Facilitate patient progression through stages of change regarding substance use diagnoses and concerns and Identify triggers associated with mental health/substance abuse issues  Therapeutic Interventions: Assess for all discharge needs, 1 to 1 time with Social worker, Explore available resources and support systems, Assess for adequacy in community support network, Educate family and significant other(s) on suicide prevention, Complete Psychosocial Assessment, Interpersonal group therapy.  Evaluation of Outcomes: Not Met   Progress in Treatment: Attending groups: No. Participating in groups: No. New to unit. Continuing to assess.  Taking  medication as prescribed: Yes. Toleration medication: Yes. Family/Significant other contact made: No, will contact:  family member if patient consents to collateral contact.  Patient understands diagnosis: Yes. Discussing patient identified problems/goals with staff: Yes. Medical problems stabilized or resolved: Yes. Denies suicidal/homicidal ideation: Yes. Issues/concerns per patient self-inventory: No. Other: n/a   New problem(s) identified: No, Describe:  n/a  New Short Term/Long Term Goal(s): detox, medication management for mood stabilization; elimination of SI thoughts; development of comprehensive mental wellness/sobriety plan.   Patient Goal: "to get sober and get into [facility] again."   Discharge Plan or Barriers: CSW assessing for appropriate referrals. Pt was last [facility] to [facility] 03/2017. Pt is living in motel and has no current outpatient providers. [place] pamphlet and AA/NA information provided for additional community support.   Reason for Continuation of Hospitalization: Depression Medication stabilization Suicidal ideation Withdrawal symptoms  Estimated Length of Stay: Tuesday, 09/18/17  Attendees: Patient: Charlotte Strong 09/14/2017 8:30 AM  Physician: Dr. Nancy Fetter MD; Dr. Parke Poisson MD 09/14/2017 8:30 AM  Nursing: Nicoletta Dress RN; Opal Sidles RN 09/14/2017 8:30 AM  RN Care Manager:X 09/14/2017 8:30 AM  Social Worker: Press photographer, LCSW 09/14/2017 8:30 AM  Recreational Therapist: x 09/14/2017 8:30 AM  Other: Darnelle Maffucci Money NP 09/14/2017 8:30 AM  Other:  09/14/2017 8:30 AM  Other: 09/14/2017 8:30 AM    Scribe for Treatment Team: Kimber Relic Smart, LCSW 09/14/2017 8:30 AM
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
## 2017-09-14 NOTE — [facility] Group Notes (Signed)
LCSW Group Therapy Note  09/14/2017 1:15pm  Type of Therapy and Topic:  Group Therapy:  Feelings around Relapse and Recovery  Participation Level:  Did Not Attend--pt invited. Chose to remain in bed.    Description of Group:    Patients in this group will discuss emotions they experience before and after a relapse. They will process how experiencing these feelings, or avoidance of experiencing them, relates to having a relapse. Facilitator will guide patients to explore emotions they have related to recovery. Patients will be encouraged to process which emotions are more powerful. They will be guided to discuss the emotional reaction significant others in their lives may have to their relapse or recovery. Patients will be assisted in exploring ways to respond to the emotions of others without this contributing to a relapse.  Therapeutic Goals: 1. Patient will identify two or more emotions that lead to a relapse for them 2. Patient will identify two emotions that result when they relapse 3. Patient will identify two emotions related to recovery 4. Patient will demonstrate ability to communicate their needs through discussion and/or role plays   Summary of Patient Progress:    x Therapeutic Modalities:   Cognitive Behavioral Therapy Solution-Focused Therapy Assertiveness Training Relapse Prevention Therapy   Ledell PeoplesHeather N Smart, LCSW 09/14/2017 3:02 PM

## 2017-09-14 NOTE — Progress Notes (Signed)
Patient ID: Charlotte Strong, female   DOB: [birth year], 42 y.o.   MRN: [id number]  D: Pt observed in the dayroom interacting with peers. Pt at the time of assessment endorsed moderate anxiety, depression and passive SI-Pt contracts for safety; states, "I'm still feeling like doing something to myself but I won't." Pt also complained of generalized body pain-see MAR. A: Medications offered as prescribed. Pt given the opportunity to ask questions and state concerns. Support, encouragement, and safe environment provided. R: Pt was med compliant. All patient's questions and concerns addressed. 15-minute safety checks continue. Safety checks continue. Pt did attend wrap-up group.

## 2017-09-14 NOTE — Progress Notes (Addendum)
Nutrition Brief Note  Patient identified on the Malnutrition Screening Tool (MST) Report  Wt Readings from Last 15 Encounters:  09/13/17 125 lb (56.7 kg)  09/13/17 130 lb (59 kg)  03/15/17 108 lb (49 kg)  03/15/17 115 lb (52.2 kg)  09/10/15 127 lb (57.6 kg)  05/27/15 123 lb (55.8 kg)  10/14/14 125 lb 3.5 oz (56.8 kg)  10/04/14 145 lb 1 oz (65.8 kg)  10/07/13 115 lb (52.2 kg)  07/01/13 130 lb (59 kg)  12/11/11 121 lb (54.9 kg)  09/17/11 131 lb (59.4 kg)    Body mass index is 20.18 kg/m. Patient meets criteria for normal weight based on current BMI. Skin WDL. Pt was admitted voluntarily and notes indicate that she was using heroin and taking Xanax daily x3 months.  Current diet order is Heart Healthy and patient is eating as desired for meals and snacks at this time. Labs and medications reviewed.   Ensure Enlive ordered BID per ONS protocol. Each supplement provides 350 kcal and 20 grams of protein. No other nutrition interventions warranted at this time. If nutrition issues arise, please consult RD.      Trenton GammonJessica Randy Whitener, MS, RD, LDN, [facility] Inpatient Clinical Dietitian Pager # [phone] After hours/weekend pager # [phone]

## 2017-09-14 NOTE — Progress Notes (Signed)
Recreation Therapy Notes  Date: 4.5.19 Time: 9:30 a.m. Location: [address] Dayroom       Group Topic/Focus: Stress Management   Goal Area(s) Addresses:  Patient will engage in pro-social way in yoga group with.  Patient will demonstrate no behavioral issues during group.   Behavioral Response: Appropriate   Intervention: Yoga   Clinical Observations/Feedback: Patient participated in yoga group, lead by Recreation Therapy Intern. Recreation Therapy Intern led group through various yoga poses and breathing techniques. Patient engaged in yoga practice appropriately and demonstrated no behavioral issues during group.   Sheryle Hailarian Mekaila Tarnow, Recreation Therapy Intern  Sheryle HailDarian Jerrika Ledlow 09/14/2017 11:44 AM
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
## 2017-09-14 NOTE — Progress Notes (Signed)
The patient attended approximately half of the evening A.A.meeting and then returned to her room to lay down.

## 2017-09-14 NOTE — Progress Notes (Signed)
Patient ID: Charlotte Strong, female   DOB: December 27, 1975, 42 y.o.   MRN: [id number]  Pt currently presents with a worried affect and anxious behavior. Presents with signs and symptoms of withdrawal including headache, fatigue, muscle spasms and nausea/vomiting. Audible wheezing heard from patient during interaction previous to albuterol administration. Endorses a lack of appetite due to nausea when around food. Pt reports intermittent sleep with current medication regimen.    Pt provided with medications per providers orders. Pt's labs and vitals were monitored throughout the night. Pt given a 1:1 about emotional and mental status. Pt supported and encouraged to express concerns and questions. Pt educated on medications and encouraged to utilize prns to help with appetite during meals, see MAR.  Pt's safety ensured with 15 minute and environmental checks. Pt currently denies SI/HI and A/V hallucinations. Pt verbally agrees to seek staff if SI/HI or A/VH occurs and to consult with staff before acting on any harmful thoughts. Wheezes resolve after intervention. Will continue POC.

## 2017-09-14 NOTE — Progress Notes (Signed)
D: Pt A & O X4. Denies SI, HI, AVH and pain. Presents anxious,  animated on approach, forwards on conversations. Visible in milieu for most of this shift. Reports she's sleeping well with good appetite. Endorsed being depressed with anxiety.    A: All medications given as ordered with verbal education and effects monitored. Support and encouragement provided to pt. Q 15 minutes safety checks maintained without self harm gestures or outburst. R: Pt did not attend groups despite multiple prompts. Off unit for meals, returned without issues. Took her medications when offered. Denies adverse drug reactions at this time. POC continues for safety and mood stability.

## 2017-09-14 NOTE — BHH Suicide Risk Assessment (Signed)
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 Major depressive disorder, single episode, severe without psychosis (HCC) [F32.2] 09/13/2017  . 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 Polysubstance dependence including opioid type drug, continuous use, with perceptual disturbance (HCC) [Z61.096][F19.288] 09/24/2015  . 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 Substance abuse (HCC) [F19.10]   . Suicidal ideation [R45.851]   . Left foot drop [M21.372] 12/01/2014  . Left wrist drop [M21.332] 12/01/2014  . Substance induced mood disorder (HCC) [F19.94] 10/13/2014  . 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 Compartment syndrome of lower extremity (HCC) [T79.A29A] 10/08/2014  . Wheezing [R06.2]   . Neuropathic pain [M79.2]   . Depression with anxiety [F41.8]   . Palliative care encounter [Z51.5]   . Pain [R52]   . Encephalopathy acute [G93.40]   . Aspiration pneumonia (HCC) [J69.0]   . Altered mental status [R41.82] 09/30/2014  . Acute renal failure (HCC) [N17.9] 09/30/2014  . Rhabdomyolysis [M62.82] 09/30/2014  . Acute respiratory failure with hypoxia (HCC) [J96.01] 09/30/2014  . Lactic acidosis [E87.2] 09/30/2014   Subjective Data: See H&P for details  Continued Clinical Symptoms:  Alcohol Use Disorder Identification Test Final Score (AUDIT): 1 The "Alcohol Use Disorders Identification Test", Guidelines for Use in Primary Care, Second Edition.  World Science writerHealth Organization [facility](WHO). Score between  0-7:  no or low risk or alcohol related problems. Score between 8-15:  moderate risk of alcohol related problems. Score between 16-19:  high risk of alcohol related problems. Score 20 or above:  warrants further diagnostic evaluation for alcohol dependence and treatment.   CLINICAL FACTORS:   Severe Anxiety and/or Agitation Depression:   Comorbid alcohol abuse/dependence Alcohol/Substance Abuse/Dependencies   Psychiatric Specialty Exam: Physical Exam  Nursing note and vitals reviewed.   ROS- See H&P for details  Blood pressure 134/73, pulse 85, temperature 100.3 F (37.9 C), temperature source Oral, resp. rate 16, height 5\' 6"  (1.676 m), weight 56.7 kg (125 lb).Body mass index is 20.18 kg/m.    COGNITIVE FEATURES THAT CONTRIBUTE TO RISK:  None    SUICIDE RISK:   Severe:  Frequent, intense, and enduring suicidal ideation, specific plan, no subjective intent, but some objective markers of intent (i.e., choice of lethal method), the method is accessible, some limited preparatory behavior, evidence of impaired self-control, severe dysphoria/symptomatology, multiple risk factors present, and few if any protective factors, particularly a lack of social support.  PLAN OF CARE: See H&P for details  I certify that inpatient services furnished can reasonably be expected to improve the patient's condition.   Micheal Likenshristopher T Ladawn Boullion, MD 09/14/2017, 1:14 PM

## 2017-09-15 DIAGNOSIS — K219 Gastro-esophageal reflux disease without esophagitis: Secondary | ICD-10-CM | POA: Diagnosis present

## 2017-09-15 DIAGNOSIS — F1910 Other psychoactive substance abuse, uncomplicated: Secondary | ICD-10-CM

## 2017-09-15 LAB — URINALYSIS, ROUTINE W REFLEX MICROSCOPIC
Bilirubin Urine: NEGATIVE
GLUCOSE, UA: NEGATIVE mg/dL
HGB URINE DIPSTICK: NEGATIVE
Ketones, ur: 5 mg/dL — AB
LEUKOCYTES UA: NEGATIVE
Nitrite: NEGATIVE
PROTEIN: NEGATIVE mg/dL
SPECIFIC GRAVITY, URINE: 1.031 — AB (ref 1.005–1.030)
pH: 5 (ref 5.0–8.0)

## 2017-09-15 MED ORDER — PANTOPRAZOLE SODIUM 20 MG PO TBEC
20.0000 mg | DELAYED_RELEASE_TABLET | Freq: Every day | ORAL | Status: DC
  Administered 2017-09-15 – 2017-09-20 (×6): 20 mg via ORAL
  Filled 2017-09-15 (×4): qty 1
  Filled 2017-09-15 (×2): qty 14
  Filled 2017-09-15 (×3): qty 1

## 2017-09-15 MED ORDER — TRAZODONE HCL 100 MG PO TABS
100.0000 mg | ORAL_TABLET | Freq: Every evening | ORAL | Status: DC | PRN
Start: 2017-09-15 — End: 2017-09-17
  Administered 2017-09-15 – 2017-09-16 (×3): 100 mg via ORAL
  Filled 2017-09-15 (×8): qty 1
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
## 2017-09-15 NOTE — Progress Notes (Signed)
D: Pt was in the dayroom upon initial approach.  Pt presents with depressed affect and mood.  She forwards little information.  Pt describes her day as "terrible" because of her withdrawal symptoms, which she reports as: muscle spasms, "cravings, chills, sweats," decreased appetite.  Pt denies SI/HI, denies hallucinations, reports pain from muscle spasms of 8/10.  Pt has been visible in milieu interacting with peers appropriately. She attended evening group.  A: Introduced self to pt.  Actively listened to pt and offered support and encouragement. Medications administered per order.  Fall prevention techniques reviewed and pt verbalized understanding.  PRN medication administered for shortness of breath and muscle spasms.  Q15 minute safety checks maintained.  R: Pt is safe on the unit.  Pt is compliant with medications.  Pt verbally contracts for safety.  Will continue to monitor and assess.

## 2017-09-15 NOTE — Progress Notes (Addendum)
D: Patient continues to report withdrawal symptoms.  He reports tremors, diarrhea, cravings, agitation, runny nose, chilling, cramping, nausea and irritability.  She is visible in the milieu and is attending recreation therapy groups (outside time).  She is interacting well.  Her affect is flat, blunted; her mood is depressed.  She rates her depression, anxiety and hopelessness as a 10.  She continues to report poor sleep and appetite; low energy level and poor concentration.  She continues to endorse thoughts of self harm without any specific plan.  A: Continue to monitor medication management and MD orders.  Safety checks completed every 15 minutes per protocol.  Offer support and encouragement as needed.  R: Patient is receptive to staff; her behavior is appropriate.

## 2017-09-15 NOTE — Progress Notes (Signed)
Patient did attend the evening speaker AA meeting.  

## 2017-09-15 NOTE — Plan of Care (Signed)
  Problem: Safety: Goal: Periods of time without injury will increase Outcome: Progressing Note:  Pt has not harmed self or others tonight.  She denies SI/HI and verbally contracts for safety.    

## 2017-09-16 NOTE — Progress Notes (Signed)
D: Pt was in the dayroom upon initial approach.  Pt presents with depressed affect and mood.  She reports she is feeling "way better" compared to yesterday.  Pt states "I was able to eat more today and I'm getting my concentration back, I got a clear head."  Pt denies SI/HI, denies hallucinations, reports bilateral leg pain of 7/10.  Pt has been visible in milieu interacting with peers and staff appropriately.  Pt attended evening group.    A: Introduced self to pt.  Actively listened to pt and offered support and encouragement. Medications administered per order.  PRN medication administered for muscle spasms and shortness of breath.  Q15 minute safety checks maintained.  R: Pt is safe on the unit.  Pt is compliant with medications.  Pt verbally contracts for safety.  Will continue to monitor and assess.

## 2017-09-16 NOTE — [facility] Group Notes (Signed)
[facility] Group Notes:  (Nursing/MHT/Case Management/Adjunct)  Date:  09/16/2017  Time:  5:33 PM Type of Therapy:  Psychoeducational Skills  Participation Level:  Active  Participation Quality:  Appropriate  Affect:  Appropriate  Cognitive:  Appropriate  Insight:  Appropriate  Engagement in Group:  Engaged  Modes of Intervention:  Problem-solving  Summary of Progress/Problems: Pt shared with each about their story and how they battle with addiction. Charlotte Strong O Charlotte Strong 09/16/2017, 5:33 PM

## 2017-09-16 NOTE — Progress Notes (Signed)
DAR NOTE: Patient presents with anxious affect and depressed mood. Pt complained of luck of sleep and poor appetite. 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 Complained of muscle pain in the both legs, took robaxin for that. Denies auditory and visual hallucinations.  Rates depression at 9, hopelessness at 9, and anxiety at 9.  Maintained on routine safety checks.  Medications given as prescribed.  Support and encouragement offered as needed.  Attended group and participated.  States goal for today is " to eat more."  Patient observed socializing with peers in the dayroom.  Offered no complaint.

## 2017-09-16 NOTE — [facility] Group Notes (Signed)
[facility] LCSW Group Therapy Note  Date/Time: 09/16/17, 0900  Type of Therapy/Topic:  Group Therapy:  Feelings about Diagnosis  Participation Level:  Active   Mood: pleasant   Description of Group:    This group will allow patients to explore their thoughts and feelings about diagnoses they have received. Patients will be guided to explore their level of understanding and acceptance of these diagnoses. Facilitator will encourage patients to process their thoughts and feelings about the reactions of others to their diagnosis, and will guide patients in identifying ways to discuss their diagnosis with significant others in their lives. This group will be process-oriented, with patients participating in exploration of their own experiences as well as giving and receiving support and challenge from other group members.   Therapeutic Goals: 1. Patient will demonstrate understanding of diagnosis as evidence by identifying two or more symptoms of the disorder:  2. Patient will be able to express two feelings regarding the diagnosis 3. Patient will demonstrate ability to communicate their needs through discussion and/or role plays  Summary of Patient Progress: Pt had good participation in group talking about symptoms of several diagnoses, stigma, and appropriate response to diagnosis.  Pt identified shame and fear as feelings she is currently having regarding her diagnoses.          Therapeutic Modalities:   Cognitive Behavioral Therapy Brief Therapy Feelings Identification   Daleen SquibbGreg Hiba Garry, LCSW

## 2017-09-16 NOTE — Plan of Care (Signed)
Pt stated her emotional status are getting better.
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
## 2017-09-17 DIAGNOSIS — F11929 Opioid use, unspecified with intoxication, unspecified: Secondary | ICD-10-CM

## 2017-09-17 DIAGNOSIS — F13929 Sedative, hypnotic or anxiolytic use, unspecified with intoxication, unspecified: Secondary | ICD-10-CM

## 2017-09-17 MED ORDER — LORATADINE 10 MG PO TABS
10.0000 mg | ORAL_TABLET | Freq: Every day | ORAL | Status: DC
  Administered 2017-09-17 – 2017-09-20 (×4): 10 mg via ORAL
  Filled 2017-09-17 (×2): qty 1
  Filled 2017-09-17 (×2): qty 14
  Filled 2017-09-17 (×4): qty 1

## 2017-09-17 MED ORDER — MIRTAZAPINE 7.5 MG PO TABS
7.5000 mg | ORAL_TABLET | Freq: Every day | ORAL | Status: DC
  Administered 2017-09-17 – 2017-09-19 (×3): 7.5 mg via ORAL
  Filled 2017-09-17: qty 14
  Filled 2017-09-17: qty 1
  Filled 2017-09-17: qty 14
  Filled 2017-09-17 (×3): qty 1

## 2017-09-17 NOTE — Progress Notes (Signed)
Adult Psychoeducational Group Note  Date:  09/17/2017 Time:  10:07 PM  Group Topic/Focus:  Wrap-Up Group:   The focus of this group is to help patients review their daily goal of treatment and discuss progress on daily workbooks.  Participation Level:  Active  Participation Quality:  Appropriate  Affect:  Appropriate  Cognitive:  Alert  Insight: Appropriate  Engagement in Group:  Engaged  Modes of Intervention:  Discussion  Additional Comments:  Pt stated that her goal was to stay up during the day so that she can rest better tonight. She was able to stay up for most of the day.   Kaleen OdeaCOOKE, Synthia Fairbank R 09/17/2017, 10:07 PM

## 2017-09-17 NOTE — Progress Notes (Signed)
DAR NOTE: Patient presents with bright affect and calm  mood. Pt stated " I feel better today than yesterday except my legs and my back which bother me." Reports fair night sleep, poor appetite, low energy, and good concentration. Denies pain, auditory and visual hallucinations.  Rates depression at 8, hopelessness at 8, and anxiety at 8.  Maintained on routine safety checks.  Medications given as prescribed.  Support and encouragement offered as needed. States goal for today is "to eat more."  Patient observed socializing with peers in the dayroom.  Offered no complaint.

## 2017-09-17 NOTE — [facility] Group Notes (Signed)
LCSW Group Therapy Note   09/17/2017 1:15pm   Type of Therapy and Topic:  Group Therapy:  Overcoming Obstacles   Participation Level:  Active   Description of Group:    In this group patients will be encouraged to explore what they see as obstacles to their own wellness and recovery. They will be guided to discuss their thoughts, feelings, and behaviors related to these obstacles. The group will process together ways to cope with barriers, with attention given to specific choices patients can make. Each patient will be challenged to identify changes they are motivated to make in order to overcome their obstacles. This group will be process-oriented, with patients participating in exploration of their own experiences as well as giving and receiving support and challenge from other group members.   Therapeutic Goals: 1. Patient will identify personal and current obstacles as they relate to admission. 2. Patient will identify barriers that currently interfere with their wellness or overcoming obstacles.  3. Patient will identify feelings, thought process and behaviors related to these barriers. 4. Patient will identify two changes they are willing to make to overcome these obstacles:      Summary of Patient Progress   Charlotte Strong was attentive and engaged during today's processing group. She shared that her main obstacle involves getting into treatment directly from the hospital. "I know it is dangerous for me to leave here without getting directly into [facility]." Charlotte Strong fears that she will relapse before getting help. She continues to show progress in the group setting with improving insight.    Therapeutic Modalities:   Cognitive Behavioral Therapy Solution Focused Therapy Motivational Interviewing Relapse Prevention Therapy  Ledell PeoplesHeather N Smart, LCSW 09/17/2017 12:15 PM

## 2017-09-17 NOTE — Progress Notes (Signed)
Patient ID: Charlotte Strong, female   DOB: 1976/04/30, 42 y.o.   MRN: [id number]  Pt currently presents with a Charlotte Strong and cooperative behavior. Pt reports to writer her appetite has been improving although she did not eat anything for lunch today. Pt also reports that she is no longer in pain "all over" but reports continuing mucsles spasms and pain in her legs. Pt reports good sleep with current medication regimen, reports provider changed one of her bedtime medications to help with her appetite. Pt states "I hope it works."   Pt provided with medications per providers orders. Pt's labs and vitals were monitored throughout the night. Pt given a 1:1 about emotional and mental status. Pt supported and encouraged to express concerns and questions. Pt educated on medications, signs and symptoms of allergic reaction and assertiveness.   Pt's safety ensured with 15 minute and environmental checks. Pt currently denies SI/HI and A/V hallucinations. Pt verbally agrees to seek staff if SI/HI or A/VH occurs and to consult with staff before acting on any harmful thoughts. Will continue POC.
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
## 2017-09-17 NOTE — Progress Notes (Signed)
Recreation Therapy Notes  Date: 4.8.19 Time: 9:30 a.m. Location: [address] Dayroom   Group Topic: Stress Management   Goal Area(s) Addresses:  Goal 1.1: To reduce stress  -Patient will feel a reduction in stress level  -Patient will learn the importance of stress management  -Patient will participate during stress management group   Behavioral Response: Engaged   Intervention: Stress Management   Activity: Meditation- Patients were in a peaceful environment with soft lighting enhancing patients mood. Patients listened to a body scan meditation to help decrease tension and stress levels   Education: Stress Management, Discharge Planning.    Education Outcome: Acknowledges edcuation/In group clarification offered/Needs additional education   Clinical Observations/Feedback:: Patient attended and participated appropriately during stress management group treatment.    Sheryle Hailarian Shayne Deerman, Recreation Therapy Intern   Sheryle HailDarian Aramis Zobel 09/17/2017 8:25 AM

## 2017-09-18 NOTE — Progress Notes (Signed)
Recreation Therapy Notes  Animal-Assisted Activity (AAA) Program Checklist/Progress Notes Patient Eligibility Criteria Checklist & Daily Group note for Rec Tx Intervention  Date: 4.9.19 Time: 2:45 Location: [address] Dayroom   AAA/T Program Assumption of Risk Form signed by Engineer, productionatient/ or Parent Legal Guardian Yes  Patient is free of allergies or sever asthma Yes  Patient reports no fear of animals Yes  Patient reports no history of cruelty to animals Yes  Patient understands his/her participation is voluntary Yes  Patient washes hands before animal contact Yes  Patient washes hands after animal contact Yes  Behavioral Response: Appropriate   Education: Hand Washing, Appropriate Animal Interaction   Education Outcome: Acknowledges education.   Clinical Observations/Feedback: Patient attended session and interacted appropriately with therapy dog and peers. Patient asked appropriate questions about therapy dog and his training.   Sheryle Hailarian [name], Recreation Therapy Intern   Sheryle HailDarian Kyzer Strong 09/18/2017 1:52 PM

## 2017-09-18 NOTE — Progress Notes (Signed)
DAR NOTE: Patient presents with calm affect and pleasant  mood. Pt visible in the milieu interacting well with peers. Pt complained lower back pain of 9/10 and Tylenol 650 mg Po given. Pt stated in general she feels better today. Pt reports fair sleep, poor appetite, low energy, and good concentration. Denies auditory and visual hallucinations.  Rates depression at 7, hopelessness at 5, and anxiety at 5.  Maintained on routine safety checks.  Medications given as prescribed.  Support and encouragement offered as needed.  Attended group and participated.  States goal for today is ' working on eating more." Patient observed socializing with peers in the dayroom.  Offered no complaint.

## 2017-09-18 NOTE — [facility] Group Notes (Addendum)
[facility]      09/18/2017 1:43 PM  Type of Therapy: [facility] Presentation  Participation Level: DID NOT ATTEND                 Summary of Progress/Problems:  Invited, chose not to attend.   Alcario DroughtJolan Willard Madrigal LCSWA Clinical Social Worker
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
## 2017-09-19 MED ORDER — VENLAFAXINE HCL ER 75 MG PO CP24: 75.0000 mg | ORAL_CAPSULE | Freq: Every day | ORAL | 0 refills | Status: DC

## 2017-09-19 MED ORDER — HYDROXYZINE HCL 25 MG PO TABS: 25.0000 mg | ORAL_TABLET | Freq: Three times a day (TID) | ORAL | 0 refills | Status: DC | PRN

## 2017-09-19 MED ORDER — ALBUTEROL SULFATE HFA 108 (90 BASE) MCG/ACT IN AERS: 1.0000 | INHALATION_SPRAY | Freq: Four times a day (QID) | RESPIRATORY_TRACT | Status: DC | PRN

## 2017-09-19 MED ORDER — LORATADINE 10 MG PO TABS: 10.0000 mg | ORAL_TABLET | Freq: Every day | ORAL | Status: DC

## 2017-09-19 MED ORDER — PANTOPRAZOLE SODIUM 20 MG PO TBEC: 20.0000 mg | DELAYED_RELEASE_TABLET | Freq: Every day | ORAL | 0 refills | Status: DC

## 2017-09-19 MED ORDER — LINACLOTIDE 145 MCG PO CAPS: 145.0000 ug | ORAL_CAPSULE | Freq: Every day | ORAL | Status: DC

## 2017-09-19 MED ORDER — GABAPENTIN 100 MG PO CAPS: 100.0000 mg | ORAL_CAPSULE | Freq: Three times a day (TID) | ORAL | 0 refills | Status: DC

## 2017-09-19 MED ORDER — NICOTINE 21 MG/24HR TD PT24: 21.0000 mg | MEDICATED_PATCH | Freq: Every day | TRANSDERMAL | 0 refills | Status: DC

## 2017-09-19 MED ORDER — MIRTAZAPINE 7.5 MG PO TABS: 7.5000 mg | ORAL_TABLET | Freq: Every day | ORAL | 0 refills | Status: DC

## 2017-09-19 MED ORDER — OLANZAPINE 5 MG PO TABS: 5.0000 mg | ORAL_TABLET | Freq: Every day | ORAL | 0 refills | Status: DC

## 2017-09-19 NOTE — [facility] Group Notes (Signed)
[facility] Group Notes:  (Nursing/MHT/Case Management/Adjunct)  Date:  09/19/2017  Time:  1600 Type of Therapy:  Nurse Education/Recovery   Participation Level:  Active  Participation Quality:  Appropriate  Affect:  Appropriate  Cognitive:  Appropriate  Insight:  Appropriate  Engagement in Group:  Engaged  Modes of Intervention:  Discussion and Education  Summary of Progress/Problems:  Chung Chagoya L  

## 2017-09-19 NOTE — [facility] Group Notes (Signed)
LCSW Group Therapy Note 09/19/2017 3:17 PM  Type of Therapy/Topic: Group Therapy: Emotion Regulation  Participation Level: Active   Description of Group:  The purpose of this group is to assist patients in learning to regulate negative emotions and experience positive emotions. Patients will be guided to discuss ways in which they have been vulnerable to their negative emotions. These vulnerabilities will be juxtaposed with experiences of positive emotions or situations, and patients will be challenged to use positive emotions to combat negative ones. Special emphasis will be placed on coping with negative emotions in conflict situations, and patients will process healthy conflict resolution skills.  Therapeutic Goals: 1. Patient will identify two positive emotions or experiences to reflect on in order to balance out negative emotions 2. Patient will label two or more emotions that they find the most difficult to experience 3. Patient will demonstrate positive conflict resolution skills through discussion and/or role plays  Summary of Patient Progress:  Kadance was engaged and participated throughout the group session. Robyn reports that emotional regulation was "learning how to control emotions". Lasondra reports that sadness is the main emotions she struggles with regulating. Aleisa states that her body often gives her signals when she is feeling an emotion. Latrish stated that she often begins to shake and cry when she becomes upset. Adreena states that she plans to identify why she feel sad often in order to better manage her emotions in the future.   Therapeutic Modalities:  Cognitive Behavioral Therapy Feelings Identification Dialectical Behavioral Therapy   Alcario DroughtJolan Day Deery LCSWA Clinical Social Worker

## 2017-09-19 NOTE — Tx Team (Signed)
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 acetaminophen (TYLENOL) tablet 650 mg  650 mg Oral Q6H PRN Charm Rings, NP   650 mg at 09/18/17 1019  . albuterol (PROVENTIL HFA;VENTOLIN HFA) 108 (90 Base) MCG/ACT inhaler 1-2 puff  1-2 puff Inhalation Q6H PRN Charm Rings, NP   2 puff at 09/18/17 1211  . alum & mag hydroxide-simeth (MAALOX/MYLANTA) 200-200-20 MG/5ML suspension 30 mL  30 mL Oral Q4H PRN Charm Rings, NP      . gabapentin (NEURONTIN) capsule 100 mg  100 mg Oral TID Micheal Likens, MD   100 mg at 09/19/17 1206  . hydrOXYzine (ATARAX/VISTARIL) tablet 25 mg  25 mg Oral TID PRN Charm Rings, NP   25 mg at 09/13/17 2048  . linaclotide (LINZESS) capsule 145 mcg  145 mcg Oral Daily Charm Rings, NP   145 mcg at 09/19/17 0746  . loratadine (CLARITIN) tablet 10 mg  10 mg Oral Daily Armandina Stammer I, NP   10 mg at 09/19/17 0746  . magnesium hydroxide (MILK OF MAGNESIA) suspension 30 mL  30 mL Oral Daily PRN Charm Rings, NP   30 mL at 09/17/17 0617  . mirtazapine (REMERON) tablet 7.5 mg  7.5 mg Oral QHS Nwoko, Agnes I, NP   7.5 mg at 09/18/17 2206  . multivitamin with minerals tablet 1 tablet  1 tablet Oral Daily Charm Rings, NP   1 tablet at 09/19/17 0746  . nicotine (NICODERM CQ - dosed in mg/24 hours) patch 21 mg  21 mg Transdermal Daily Cobos, Rockey Situ, MD   21 mg at 09/19/17 0746  . OLANZapine (ZYPREXA)  tablet 5 mg  5 mg Oral QHS Micheal Likens, MD   5 mg at 09/18/17 2206  . pantoprazole (PROTONIX) EC tablet 20 mg  20 mg Oral Daily Money, Gerlene Burdock, FNP   20 mg at 09/19/17 0746  . thiamine (B-1) injection 100 mg  100 mg Intramuscular Once Charm Rings, NP      . thiamine (VITAMIN B-1) tablet 100 mg  100 mg Oral Daily Charm Rings, NP   100 mg at 09/19/17 0746  . venlafaxine XR (EFFEXOR-XR) 24 hr capsule 75 mg  75 mg Oral Q breakfast Micheal Likens, MD   75 mg at 09/19/17 0746   PTA Medications: Medications Prior to Admission  Medication Sig Dispense Refill Last Dose  . gabapentin (NEURONTIN) 100 MG capsule Take 2 capsules (200 mg total) by mouth 3 (three) times daily. For agitation (Patient not taking: Reported on 09/13/2017) 90 capsule 0 Not Taking at Unknown time  . linaclotide (LINZESS) 145 MCG CAPS capsule Take 1 capsule (145 mcg total) by mouth daily as needed (constipation). (Patient not taking: Reported on 09/13/2017) 10 capsule 0 Not Taking at Unknown time  . metroNIDAZOLE (FLAGYL) 500 MG tablet Take 1 tablet (500 mg total) by mouth every 12 (twelve) hours. For infection (Patient not taking: Reported on 09/13/2017)   Not Taking at Unknown time  . nicotine polacrilex (NICORETTE) 2 MG  gum Take 1 each (2 mg total) by mouth as needed for smoking cessation. (May buy from over the counter): (Patient not taking: Reported on 09/13/2017) 100 tablet 0 Not Taking at Unknown time  . traZODone (DESYREL) 50 MG tablet Take 1 tablet (50 mg total) by mouth at bedtime as needed for sleep. (Patient not taking: Reported on 09/13/2017) 30 tablet 0 Not Taking at Unknown time  . venlafaxine XR (EFFEXOR-XR) 75 MG 24 hr capsule Take 1 capsule (75 mg total) by mouth daily with breakfast. For depression (Patient not taking: Reported on 09/13/2017) 30 capsule 0 Not Taking at Unknown time  . [DISCONTINUED] albuterol (PROVENTIL HFA;VENTOLIN HFA) 108 (90 Base) MCG/ACT inhaler Inhale 1-2 puffs into the lungs  every 6 (six) hours as needed for wheezing or shortness of breath.   Past Month at Unknown time  . [DISCONTINUED] hydrOXYzine (ATARAX/VISTARIL) 25 MG tablet Take 1 tablet (25 mg total) by mouth 3 (three) times daily as needed for anxiety. (Patient not taking: Reported on 09/13/2017) 60 tablet 0 Not Taking at Unknown time    Patient Stressors: Medication change or noncompliance Substance abuse  Patient Strengths: Ability for insight Average or above average intelligence Capable of independent living General fund of knowledge  Treatment Modalities: Medication Management, Group therapy, Case management,  1 to 1 session with clinician, Psychoeducation, Recreational therapy.   Physician Treatment Plan for Primary Diagnosis: MDD, severe, without psychosis  Medication Management: Evaluate patient's response, side effects, and tolerance of medication regimen.  Therapeutic Interventions: 1 to 1 sessions, Unit Group sessions and Medication administration.  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  Therapeutic Interventions: 1 to 1 sessions, Unit Group sessions and Medication administration.  Evaluation of Outcomes: Progressing   RN Treatment Plan for Primary Diagnosis: MDD, severe, without psychosis Long Term Goal(s): Knowledge of disease and therapeutic regimen to maintain health will improve  Short Term Goals: Ability to remain free from injury will improve, Ability to disclose and discuss suicidal ideas and Ability to identify and develop effective coping behaviors  will improve  Medication Management: RN will administer medications as ordered by provider, will assess and evaluate patient's response and provide education to patient for prescribed medication. RN will report any adverse and/or side effects to prescribing provider.  Therapeutic Interventions: 1 on 1 counseling sessions, Psychoeducation, Medication administration, Evaluate responses to treatment, Monitor vital signs and CBGs as ordered, Perform/monitor CIWA, COWS, AIMS and Fall Risk screenings as ordered, Perform wound care treatments as ordered.  Evaluation of Outcomes: Progressing   LCSW Treatment Plan for Primary Diagnosis: MDD, severe, without psychosis Long Term Goal(s): Safe transition to appropriate next level of care at discharge, Engage patient in therapeutic group addressing interpersonal concerns.  Short Term Goals: Engage patient in aftercare planning with referrals and resources, Facilitate patient progression through stages of change regarding substance use diagnoses and concerns and Identify triggers associated with mental health/substance abuse issues  Therapeutic Interventions: Assess for all discharge needs, 1 to 1 time with Social worker, Explore available resources and support systems, Assess for adequacy in community support network, Educate family and significant other(s) on suicide prevention, Complete Psychosocial Assessment, Interpersonal group therapy.  Evaluation of Outcomes: Progressing  Progress in Treatment: Attending groups: No. Participating in groups: No. New to unit. Continuing to assess.  Taking medication as prescribed: Yes. Toleration medication: Yes. Family/Significant other contact made: No, will contact:  patient refused consent for collateral contacts' Patient understands diagnosis: Yes. Discussing patient identified problems/goals with staff: Yes. Medical problems stabilized or resolved: Yes. Denies suicidal/homicidal ideation:  Yes. Issues/concerns per patient self-inventory: No. Other: n/a   New problem(s) identified: No, Describe:  n/a  New Short Term/Long Term Goal(s): detox, medication management for mood stabilization; elimination of SI thoughts; development of comprehensive mental wellness/sobriety plan.   Patient Goal: "to get sober and get into [facility] again."   Discharge Plan or Barriers: Patient plans to discharge home [facility]   Reason for Continuation of Hospitalization: Depression Medication stabilization Suicidal ideation Withdrawal symptoms  Estimated Length of Stay: Tuesday, 09/18/17  Attendees: Patient: Charlotte Strong 09/19/2017 12:38 PM  Physician: Dr. Altamese Carolinaainville MD 09/19/2017 12:38 PM  Nursing: Lincoln Maxinlivette RN; Erskine SquibbJane RN; Liborio NixonPatrice White, RN 09/19/2017 12:38 PM  RN Care Manager:X 09/19/2017 12:38 PM  Social Worker: Trula SladeHeather Smart, LCSW; Baldo DaubJolan Milaina Sher, LCSWA 09/19/2017 12:38 PM  Recreational Therapist: x 09/19/2017 12:38 PM  Other: Feliz Beamravis Money NP 09/19/2017 12:38 PM  Other:  09/19/2017 12:38 PM  Other: 09/19/2017 12:38 PM    Scribe for Treatment Team: Maeola SarahJolan E Kerolos Nehme, LCSWA 09/19/2017 12:38 PM

## 2017-09-19 NOTE — [facility] Group Notes (Signed)
Adult Psychoeducational Group Note  Date:  09/19/2017 Time:  10:38 AM  Group Topic/Focus:  Personal Choices and Values:   The focus of this group is to help patients assess and explore the importance of values in their lives, how their values affect their decisions, how they express their values and what opposes their expression.  Participation Level:  Active  Participation Quality:  Appropriate  Affect:  Appropriate  Cognitive:  Appropriate  Insight: Appropriate  Engagement in Group:  None  Modes of Intervention:  Clarification  Additional Comments:  Pt discussed issues with being able to refrain from using upon discharge  Charlotte BeersRodney S Mostyn Strong 09/19/2017, 10:38 AM
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
## 2017-09-19 NOTE — Progress Notes (Addendum)
Pt presents with an animated affect and anxious mood. Pt rates depression 5/10. Anxiety 4/10. Pt denies SI/HI. Pt reports good sleep last night. Pt reports withdrawal symptoms of runny nose and irritability. Pt expressed that her mood is better today. Pt goal for today is to go see her dad who is sick in the hosp. Pt verbalized tolerating her meds well. Pt denies any side effects to meds. Orders reviewed with pt. Verbal support provided. Pt encouraged to attend groups. 15 minute checks performed for safety. Pt compliant with tx plan.

## 2017-09-19 NOTE — Plan of Care (Signed)
  Problem: Education: Goal: Emotional status will improve Outcome: Progressing   Problem: Activity: Goal: Interest or engagement in activities will improve Outcome: Progressing   Problem: Coping: Goal: Ability to demonstrate self-control will improve Outcome: Progressing   

## 2017-09-19 NOTE — Progress Notes (Signed)
Recreation Therapy Notes  Date: 4.10.19 Time: 9:30 a.m. Location: [address] Dayroom   Group Topic: Stress Management   Goal Area(s) Addresses:  Goal 1.1: To reduce stress  -Patient will feel a reduction in stress level  -Patient will learn the importance of stress management  -Patient will participate during stress management group   Behavioral Response: Engaged   Intervention: Stress Management   Activity: Meditation- Patients were in a peaceful environment with soft lighting enhancing patients mood. Patients listened to a deep concentration meditation to help decrease stress levels   Education: Stress Management, Discharge Planning.    Education Outcome: Acknowledges edcuation/In group clarification offered/Needs additional education   Clinical Observations/Feedback:: Patient attended and participated appropriately during stress management group treatment.    Sheryle Hailarian Landry Kamath, Recreation Therapy Intern   Sheryle HailDarian Musette Kisamore 09/19/2017 8:31 AM

## 2017-09-19 NOTE — Progress Notes (Signed)
Pt has been observed sitting in the dayroom all evening watching TV and socializing with peers.  She attended evening AA group.  She reports doing ok this evening except her legs are still restless and uncomfortable.  She requested Robaxin, but that med has discontinued from the protocol she has been on since her admission.  Writer offered her heat packs which she took to use tonight and told her if they did not relieve her discomfort to inform staff.  Pt voiced understanding.  Pt denies SI/HI/AVH at this time.  Pt has been pleasant and cooperative.  Support and encouragement offered.  Discharge plans are in process.  [facility].  Safety maintained with q15 minute checks.

## 2017-09-20 NOTE — BHH Suicide Risk Assessment (Signed)
[facility]   Principal Problem: Major depressive disorder, recurrent episode with mood-congruent psychotic features [facility](HCC) Discharge Diagnoses:  Patient Active Problem List   Diagnosis Date Noted  . GERD (gastroesophageal reflux disease) [K21.9] 09/15/2017  . Major depressive disorder, single episode, severe without psychosis (HCC) [F32.2] 09/13/2017  . 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 Polysubstance dependence including opioid type drug, continuous use, with perceptual disturbance (HCC) [Z61.096][F19.288] 09/24/2015  . 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 Substance abuse (HCC) [F19.10]   . Suicidal ideation [R45.851]   . Left foot drop [M21.372] 12/01/2014  . Left wrist drop [M21.332] 12/01/2014  . Substance induced mood disorder (HCC) [F19.94] 10/13/2014  . 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 Compartment syndrome of lower extremity (HCC) [T79.A29A] 10/08/2014  . Wheezing [R06.2]   . Neuropathic pain [M79.2]   . Depression with anxiety [F41.8]   . Palliative care encounter [Z51.5]   . Pain [R52]   . Encephalopathy acute [G93.40]   . Aspiration pneumonia (HCC) [J69.0]   . Altered mental status [R41.82] 09/30/2014  . Acute renal failure (HCC) [N17.9] 09/30/2014  . Rhabdomyolysis [M62.82] 09/30/2014  . Acute respiratory failure with hypoxia (HCC) [J96.01] 09/30/2014  . Lactic acidosis [E87.2] 09/30/2014    Total Time spent with patient: 30 minutes  Musculoskeletal: Strength & Muscle Tone: within normal limits Gait & Station: normal Patient leans: N/A  Psychiatric Specialty Exam: Review of Systems  Constitutional: Negative for chills and fever.  Respiratory: Negative for cough and shortness of breath.   Cardiovascular: Negative for chest pain.  Gastrointestinal: Negative for abdominal pain, heartburn,  nausea and vomiting.  Psychiatric/Behavioral: Negative for depression, hallucinations and suicidal ideas. The patient is not nervous/anxious and does not have insomnia.     Blood pressure 122/82, pulse 98, temperature 98.4 F (36.9 C), temperature source Oral, resp. rate 16, height 5\' 6"  (1.676 m), weight 56.7 kg (125 lb), SpO2 95 %.Body mass index is 20.18 kg/m.  General Appearance: Casual and Fairly Groomed  Patent attorneyye Contact::  Good  Speech:  Clear and Coherent and Normal Rate  Volume:  Normal  Mood:  Euthymic  Affect:  Appropriate and Congruent  Thought Process:  Coherent and Goal Directed  Orientation:  Full (Time, Place, and Person)  Thought Content:  Logical  Suicidal Thoughts:  No  Homicidal Thoughts:  No  Memory:  Immediate;   Fair Recent;   Fair Remote;   Fair  Judgement:  Fair  Insight:  Fair  Psychomotor Activity:  Normal  Concentration:  Fair  Recall:  [employer]Fair  Fund of Knowledge:Fair  Language: Fair  Akathisia:  No  Handed:    AIMS (if indicated):     Assets:  Manufacturing systems engineerCommunication Skills Physical Health Resilience Social Support  Sleep:  Number of Hours: 6  Cognition: WNL  ADL's:  Intact   Mental Status Per Nursing Assessment::   On Admission:     Demographic Factors:  Female, Caucasian, Low socioeconomic status and Unemployed  Loss Factors: NA  Historical Factors: Impulsivity  Risk Reduction Factors:   Positive social support, Positive therapeutic relationship and Positive coping skills or problem solving skills  Continued Clinical Symptoms:  Severe Anxiety and/or Agitation Depression:   Comorbid alcohol abuse/dependence Alcohol/Substance Abuse/Dependencies More than one psychiatric diagnosis Unstable or Poor Therapeutic Relationship  Cognitive Features That Contribute To Risk:  None    Suicide Risk:  Minimal: No identifiable suicidal  ideation.  Patients presenting with no risk factors but with morbid ruminations; may be classified as minimal risk based on  the severity of the depressive symptoms  Follow-up Information    [facility] Follow up on 09/20/2017.   Why:  Screening for possible admission on Thursday, 09/20/17 at 8:00AM. Please bring photo ID/proof of [facility] residency, medication supply provided by hosptital, and clothing (no leggings). Thank you.  Contact information: [address] [place] [address] [phone]        [facility] Follow up.   Specialty:  Behavioral Health Why:  Please walk in within 3 days of hospital/rehab discharge to be assessed for outpatient mental health services including medication management/therpapy. Walk in hours: Monday-Friday 8am-9am. Thank you.  [address] [place] [address] [phone]        [facility] Follow up.   Specialty:  Addiction Medicine Why:  Referral made 09/14/17.  Contact information: [address] [place] [address] [phone]         Subjective Data:  Charlotte Strong is a 42 y/o F with history of MDD with psychotic features and polysubstance abuse including opiates (heroin), cocaine, cannabis, and benzodiazepines who presents voluntarily with worsening mood symptoms, SI to overdose, and worsening substance use. Pt was started on withdrawal protocol for opiates and benzodiazepines upon initial presentation and she was transferred to [facility] for additional evaluation and stabilization. She was restarted on medications from previous discharge including Effexor XR 75mg  po qDay and gabapentin 100mg  po TID. She was also started on zyprexa for her AH symptoms. She was continued on CIWA with librium and COWS with clonidine to address withdrawal symptoms. She has been working with SW team for referral to [facility] for substance use treatment.  Today upon evaluation, pt shares, "My dad is getting out of the hospital, and then we're going to a wake tonight." Pt reports that her mood symptoms have  improved and remained stable. She denies SI/HI/AH/VH. She is sleeping well. Her appetite is good. She denies cravings for illicit substances. She plans to walk in for [facility] screening next week. Pt is in agreement to continue her current treatment regimen without changes. She was able to engage in safety planning including plan to return to [facility] or contact emergency services if she feels unable to maintain her own safety or the safety of others. Pt had no further questions, comments, or concerns.   Plan Of Care/Follow-up recommendations:   -Discharge to outpatient level of care  -MDD with psychotic features             -ContinueMirtazapine 7.5 mgpo qhs            - Continue Effexor-XR 75 mg PO Daily for depression.             - Continue Zyprexa 5 mg PO QHS for mood stability  -GERD             -Continue Protonix 20 mg PO Daily for GERD  -Anxiety             - Continue Neurontin 100 mg PO TID  -Asthma             - Continue albuterol 123mcg/act take 1-2 puffs q6h prn SOB/wheezing  -Allergies (seasonal)             -ContinueClaritin 10 mg po daily for allergies.  -Constipation             -  Continue Linaclotide qDay  Activity:  as tolerated Diet:  normal Tests:  NA Other:  see above for DC plan  Micheal Likens, MD 09/20/2017, 8:16 AM

## 2017-09-20 NOTE — Progress Notes (Signed)
Patient was discharged per order. AVS, SRA, medications, scripts, med samples, and transition summary were all reviewed with patient. Pt was given an opportunity to ask questions and verbalized understanding of all discharge paperwork. Belongings were returned, and patient signed for receipt. Patient verbalized readiness for discharge and appeared in no acute distress when escorted to lobby.

## 2017-09-20 NOTE — BHH Group Notes (Signed)
Adult Psychoeducational Group Note  Date:  09/20/2017 Time:  1:02 PM  Group Topic/Focus:  Goals Group:   The focus of this group is to help patients establish daily goals to achieve during treatment and discuss how the patient can incorporate goal setting into their daily lives to aide in recovery.  Participation Level:  Active  Participation Quality:  Appropriate  Affect:  Appropriate  Cognitive:  Alert  Insight: Appropriate  Engagement in Group:  Engaged  Modes of Intervention:  Orientation  Additional Comments:  Pt participated in orientation/goals group. Pt goal for today is to talk with her daughter before she is discharged  Dellia NimsJaquesha M Chauncey Sciulli 09/20/2017, 1:02 PM

## 2017-09-20 NOTE — Progress Notes (Signed)
Pt has been visible in the milieu and has spent most of her time this evening talking with a female peer.  She reports that she will be discharged tomorrow to go to home because of a death in her family and her father is in the hospital.  [facility]  She voices no other needs or concerns.  She denies SI/HI/AVH.  She has been pleasant and cooperative.  Support and encouragement offered.  Discharge plans are in process.  Safety maintained with q15 minute checks.

## 2017-09-20 NOTE — Progress Notes (Signed)
  [facility] :  Will you be returning to the same living situation after discharge:  Yes,  [facility].   At discharge, do you have transportation home?: Yes,  bus passes Do you have the ability to pay for your medications: No.  Release of information consent forms completed and in the chart;  Patient's signature needed at discharge.  Patient to Follow up at: Follow-up Information    Services, [facility] Follow up on 09/25/2017.   Why:  Screening for possible admission on Thursday, 09/25/17 at 8:00AM. Please [place] photo [place]/proof [place], medication supply provided by hosptital, and clothing (no leggings). Thank you.  Contact information: [address] [place] [address] [phone]        [facility] Follow up on 09/21/2017.   Specialty:  Behavioral Health Why:  Tomorrow at 8AM with Tresa EndoKelly for your hospital follow up appointment.  Bring your ID and hospital d/c paperwork.  Make sure to let them know you need help getting your prescriptions filled so that you have at least a 2 week supply [facility] Contact information: [address] [place] [address] [phone]           Next level of care provider has [facility]:yes  Safety Planning and Suicide Prevention discussed: Yes,  with the patient  Have you used any form of tobacco in the last 30 days? (Cigarettes, Smokeless Tobacco, Cigars, and/or Pipes): Yes  Has patient been referred to the Quitline?: Patient refused referral  Patient has been referred for addiction treatment: Yes  Maeola SarahJolan E Sanuel Ladnier, LCSWA 09/20/2017, 9:34 AM

## 2017-09-20 NOTE — Progress Notes (Signed)
D: Charlotte Strong has been pleasant and cooperative this a.m. She reports depression and anxiety but says she is ready for discharge. She reports no acute complaints.   A: Meds given as ordered. Q15 safety checks maintained. Support/encouragement offered.  R: Pt remains free from harm and continues with treatment. Will continue to monitor for needs/safety.

## 2017-10-12 ENCOUNTER — Inpatient Hospital Stay (HOSPITAL_COMMUNITY)
Admission: RE | Admit: 2017-10-12 | Discharge: 2017-10-17 | DRG: 885 | Disposition: A | Discharge status: Home / Self Care | Payer: No Typology Code available for payment source | Attending: Psychiatry | Admitting: Psychiatry

## 2017-10-12 ENCOUNTER — Encounter (HOSPITAL_COMMUNITY): Payer: Self-pay

## 2017-10-12 DIAGNOSIS — Z801 Family history of malignant neoplasm of trachea, bronchus and lung: Secondary | ICD-10-CM

## 2017-10-12 DIAGNOSIS — B182 Chronic viral hepatitis C: Secondary | ICD-10-CM

## 2017-10-12 DIAGNOSIS — T424X2A Poisoning by benzodiazepines, intentional self-harm, initial encounter: Secondary | ICD-10-CM | POA: Diagnosis not present

## 2017-10-12 DIAGNOSIS — F17210 Nicotine dependence, cigarettes, uncomplicated: Secondary | ICD-10-CM | POA: Diagnosis present

## 2017-10-12 DIAGNOSIS — M21372 Foot drop, left foot: Secondary | ICD-10-CM | POA: Diagnosis present

## 2017-10-12 DIAGNOSIS — R450 Nervousness: Secondary | ICD-10-CM | POA: Diagnosis not present

## 2017-10-12 DIAGNOSIS — M21332 Wrist drop, left wrist: Secondary | ICD-10-CM | POA: Diagnosis present

## 2017-10-12 DIAGNOSIS — F333 Major depressive disorder, recurrent, severe with psychotic symptoms: Secondary | ICD-10-CM | POA: Diagnosis present

## 2017-10-12 DIAGNOSIS — T1491XA Suicide attempt, initial encounter: Secondary | ICD-10-CM | POA: Diagnosis not present

## 2017-10-12 DIAGNOSIS — F332 Major depressive disorder, recurrent severe without psychotic features: Principal | ICD-10-CM | POA: Diagnosis present

## 2017-10-12 DIAGNOSIS — B1920 Unspecified viral hepatitis C without hepatic coma: Secondary | ICD-10-CM | POA: Diagnosis present

## 2017-10-12 DIAGNOSIS — K219 Gastro-esophageal reflux disease without esophagitis: Secondary | ICD-10-CM | POA: Diagnosis present

## 2017-10-12 DIAGNOSIS — F1024 Alcohol dependence with alcohol-induced mood disorder: Secondary | ICD-10-CM | POA: Diagnosis not present

## 2017-10-12 DIAGNOSIS — R45851 Suicidal ideations: Secondary | ICD-10-CM | POA: Diagnosis present

## 2017-10-12 DIAGNOSIS — F1099 Alcohol use, unspecified with unspecified alcohol-induced disorder: Secondary | ICD-10-CM | POA: Diagnosis not present

## 2017-10-12 DIAGNOSIS — G4700 Insomnia, unspecified: Secondary | ICD-10-CM | POA: Diagnosis present

## 2017-10-12 DIAGNOSIS — Z880 Allergy status to penicillin: Secondary | ICD-10-CM | POA: Diagnosis not present

## 2017-10-12 DIAGNOSIS — F1994 Other psychoactive substance use, unspecified with psychoactive substance-induced mood disorder: Secondary | ICD-10-CM | POA: Diagnosis present

## 2017-10-12 DIAGNOSIS — F4310 Post-traumatic stress disorder, unspecified: Secondary | ICD-10-CM | POA: Diagnosis present

## 2017-10-12 DIAGNOSIS — Z9049 Acquired absence of other specified parts of digestive tract: Secondary | ICD-10-CM

## 2017-10-12 DIAGNOSIS — F1290 Cannabis use, unspecified, uncomplicated: Secondary | ICD-10-CM | POA: Diagnosis not present

## 2017-10-12 DIAGNOSIS — F419 Anxiety disorder, unspecified: Secondary | ICD-10-CM | POA: Diagnosis not present

## 2017-10-12 DIAGNOSIS — F1920 Other psychoactive substance dependence, uncomplicated: Secondary | ICD-10-CM | POA: Diagnosis not present

## 2017-10-12 DIAGNOSIS — Z818 Family history of other mental and behavioral disorders: Secondary | ICD-10-CM | POA: Diagnosis not present

## 2017-10-12 DIAGNOSIS — F1910 Other psychoactive substance abuse, uncomplicated: Secondary | ICD-10-CM | POA: Diagnosis not present

## 2017-10-12 DIAGNOSIS — Z811 Family history of alcohol abuse and dependence: Secondary | ICD-10-CM | POA: Diagnosis not present

## 2017-10-12 DIAGNOSIS — Z62810 Personal history of physical and sexual abuse in childhood: Secondary | ICD-10-CM | POA: Diagnosis not present

## 2017-10-12 DIAGNOSIS — Z62811 Personal history of psychological abuse in childhood: Secondary | ICD-10-CM | POA: Diagnosis not present

## 2017-10-12 MED ORDER — LORAZEPAM 1 MG PO TABS
1.0000 mg | ORAL_TABLET | Freq: Every day | ORAL | Status: AC
  Administered 2017-10-17: 1 mg via ORAL
  Filled 2017-10-12: qty 1

## 2017-10-12 MED ORDER — LORAZEPAM 1 MG PO TABS
1.0000 mg | ORAL_TABLET | Freq: Four times a day (QID) | ORAL | Status: AC
  Administered 2017-10-12 – 2017-10-14 (×6): 1 mg via ORAL
  Filled 2017-10-12 (×6): qty 1

## 2017-10-12 MED ORDER — LORAZEPAM 1 MG PO TABS
1.0000 mg | ORAL_TABLET | Freq: Four times a day (QID) | ORAL | Status: AC | PRN
  Administered 2017-10-13: 1 mg via ORAL
  Filled 2017-10-12: qty 1

## 2017-10-12 MED ORDER — HYDROXYZINE HCL 25 MG PO TABS
25.0000 mg | ORAL_TABLET | Freq: Four times a day (QID) | ORAL | Status: AC | PRN
  Administered 2017-10-13 – 2017-10-14 (×3): 25 mg via ORAL
  Filled 2017-10-12 (×3): qty 1

## 2017-10-12 MED ORDER — LOPERAMIDE HCL 2 MG PO CAPS: 2.0000 mg | ORAL_CAPSULE | ORAL | Status: AC | PRN

## 2017-10-12 MED ORDER — LORAZEPAM 1 MG PO TABS
1.0000 mg | ORAL_TABLET | Freq: Three times a day (TID) | ORAL | Status: AC
  Administered 2017-10-14 – 2017-10-15 (×2): 1 mg via ORAL
  Filled 2017-10-12 (×2): qty 1

## 2017-10-12 MED ORDER — VENLAFAXINE HCL ER 37.5 MG PO CP24
37.5000 mg | ORAL_CAPSULE | Freq: Every day | ORAL | Status: DC
  Administered 2017-10-12 – 2017-10-13 (×2): 37.5 mg via ORAL
  Filled 2017-10-12 (×5): qty 1

## 2017-10-12 MED ORDER — ENSURE ENLIVE PO LIQD
237.0000 mL | Freq: Two times a day (BID) | ORAL | Status: DC
  Administered 2017-10-13 – 2017-10-16 (×7): 237 mL via ORAL

## 2017-10-12 MED ORDER — PANTOPRAZOLE SODIUM 20 MG PO TBEC
20.0000 mg | DELAYED_RELEASE_TABLET | Freq: Every day | ORAL | Status: DC
  Administered 2017-10-13 – 2017-10-17 (×5): 20 mg via ORAL
  Filled 2017-10-12 (×5): qty 1
  Filled 2017-10-12: qty 7
  Filled 2017-10-12 (×2): qty 1

## 2017-10-12 MED ORDER — VITAMIN B-1 100 MG PO TABS
100.0000 mg | ORAL_TABLET | Freq: Every day | ORAL | Status: DC
  Administered 2017-10-13 – 2017-10-17 (×5): 100 mg via ORAL
  Filled 2017-10-12 (×8): qty 1

## 2017-10-12 MED ORDER — ADULT MULTIVITAMIN W/MINERALS CH
1.0000 | ORAL_TABLET | Freq: Every day | ORAL | Status: DC
  Administered 2017-10-12 – 2017-10-17 (×6): 1 via ORAL
  Filled 2017-10-12 (×11): qty 1

## 2017-10-12 MED ORDER — MAGNESIUM HYDROXIDE 400 MG/5ML PO SUSP
30.0000 mL | Freq: Every day | ORAL | Status: DC | PRN
  Administered 2017-10-15 – 2017-10-16 (×2): 30 mL via ORAL
  Filled 2017-10-12 (×2): qty 30

## 2017-10-12 MED ORDER — ACETAMINOPHEN 325 MG PO TABS
650.0000 mg | ORAL_TABLET | Freq: Four times a day (QID) | ORAL | Status: DC | PRN
  Administered 2017-10-13 – 2017-10-14 (×2): 650 mg via ORAL
  Filled 2017-10-12 (×2): qty 2

## 2017-10-12 MED ORDER — MIRTAZAPINE 7.5 MG PO TABS
7.5000 mg | ORAL_TABLET | Freq: Every day | ORAL | Status: DC
  Administered 2017-10-12 – 2017-10-16 (×5): 7.5 mg via ORAL
  Filled 2017-10-12 (×4): qty 1
  Filled 2017-10-12: qty 7
  Filled 2017-10-12 (×4): qty 1

## 2017-10-12 MED ORDER — OLANZAPINE 5 MG PO TABS
5.0000 mg | ORAL_TABLET | Freq: Every day | ORAL | Status: DC
  Administered 2017-10-12: 5 mg via ORAL
  Filled 2017-10-12 (×4): qty 1

## 2017-10-12 MED ORDER — LORAZEPAM 1 MG PO TABS
1.0000 mg | ORAL_TABLET | Freq: Two times a day (BID) | ORAL | Status: AC
  Administered 2017-10-15 – 2017-10-16 (×2): 1 mg via ORAL
  Filled 2017-10-12 (×2): qty 1

## 2017-10-12 MED ORDER — NICOTINE 21 MG/24HR TD PT24
21.0000 mg | MEDICATED_PATCH | Freq: Every day | TRANSDERMAL | Status: DC
  Administered 2017-10-13: 21 mg via TRANSDERMAL
  Filled 2017-10-12 (×3): qty 1

## 2017-10-12 MED ORDER — THIAMINE HCL 100 MG/ML IJ SOLN
100.0000 mg | Freq: Once | INTRAMUSCULAR | Status: AC
  Administered 2017-10-12: 100 mg via INTRAMUSCULAR
  Filled 2017-10-12: qty 2

## 2017-10-12 MED ORDER — ONDANSETRON 4 MG PO TBDP: 4.0000 mg | ORAL_TABLET | Freq: Four times a day (QID) | ORAL | Status: AC | PRN

## 2017-10-12 MED ORDER — ALUM & MAG HYDROXIDE-SIMETH 200-200-20 MG/5ML PO SUSP: 30.0000 mL | ORAL | Status: DC | PRN
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
## 2017-10-12 NOTE — Progress Notes (Signed)
Patient did attend the evening speaker AA meeting.  

## 2017-10-13 ENCOUNTER — Other Ambulatory Visit: Payer: Self-pay

## 2017-10-13 DIAGNOSIS — F1910 Other psychoactive substance abuse, uncomplicated: Secondary | ICD-10-CM

## 2017-10-13 DIAGNOSIS — R45851 Suicidal ideations: Secondary | ICD-10-CM

## 2017-10-13 DIAGNOSIS — F1024 Alcohol dependence with alcohol-induced mood disorder: Secondary | ICD-10-CM

## 2017-10-13 DIAGNOSIS — F17210 Nicotine dependence, cigarettes, uncomplicated: Secondary | ICD-10-CM

## 2017-10-13 DIAGNOSIS — F332 Major depressive disorder, recurrent severe without psychotic features: Principal | ICD-10-CM

## 2017-10-13 DIAGNOSIS — Z811 Family history of alcohol abuse and dependence: Secondary | ICD-10-CM

## 2017-10-13 DIAGNOSIS — Z818 Family history of other mental and behavioral disorders: Secondary | ICD-10-CM

## 2017-10-13 LAB — COMPREHENSIVE METABOLIC PANEL
ALBUMIN: 4 g/dL (ref 3.5–5.0)
ALK PHOS: 90 U/L (ref 38–126)
ALT: 184 U/L — ABNORMAL HIGH (ref 14–54)
AST: 82 U/L — AB (ref 15–41)
Anion gap: 11 (ref 5–15)
BILIRUBIN TOTAL: 0.7 mg/dL (ref 0.3–1.2)
BUN: 15 mg/dL (ref 6–20)
CALCIUM: 9.7 mg/dL (ref 8.9–10.3)
CO2: 26 mmol/L (ref 22–32)
Chloride: 101 mmol/L (ref 101–111)
Creatinine, Ser: 0.68 mg/dL (ref 0.44–1.00)
GFR calc Af Amer: >60 mL/min (ref >60)
GFR calc non Af Amer: >60 mL/min (ref >60)
GLUCOSE: 108 mg/dL — AB (ref 65–99)
Potassium: 3.9 mmol/L (ref 3.5–5.1)
Sodium: 138 mmol/L (ref 135–145)
TOTAL PROTEIN: 7.8 g/dL (ref 6.5–8.1)

## 2017-10-13 LAB — LIPID PANEL
Cholesterol: 261 mg/dL — ABNORMAL HIGH (ref 0–200)
HDL: 84 mg/dL (ref >40)
LDL Cholesterol: 147 mg/dL — ABNORMAL HIGH (ref 0–99)
Total CHOL/HDL Ratio: 3.1 RATIO
Triglycerides: 150 mg/dL — ABNORMAL HIGH (ref <150)
VLDL: 30 mg/dL (ref 0–40)

## 2017-10-13 LAB — CBC
HEMATOCRIT: 47.2 % — AB (ref 36.0–46.0)
Hemoglobin: 16.3 g/dL — ABNORMAL HIGH (ref 12.0–15.0)
MCH: 32.2 pg (ref 26.0–34.0)
MCHC: 34.5 g/dL (ref 30.0–36.0)
MCV: 93.3 fL (ref 78.0–100.0)
Platelets: 302 10*3/uL (ref 150–400)
RBC: 5.06 MIL/uL (ref 3.87–5.11)
RDW: 13.6 % (ref 11.5–15.5)
WBC: 8.3 10*3/uL (ref 4.0–10.5)

## 2017-10-13 LAB — RAPID URINE DRUG SCREEN, HOSP PERFORMED
AMPHETAMINES: NOT DETECTED
BENZODIAZEPINES: POSITIVE — AB
Barbiturates: NOT DETECTED
Cocaine: NOT DETECTED
OPIATES: NOT DETECTED
Tetrahydrocannabinol: POSITIVE — AB

## 2017-10-13 LAB — TSH: TSH: 1.645 u[IU]/mL (ref 0.350–4.500)

## 2017-10-13 LAB — PREGNANCY, URINE: PREG TEST UR: NEGATIVE

## 2017-10-13 MED ORDER — OLANZAPINE 2.5 MG PO TABS
2.5000 mg | ORAL_TABLET | Freq: Every day | ORAL | Status: DC
  Administered 2017-10-14 (×2): 2.5 mg via ORAL
  Filled 2017-10-13 (×4): qty 1

## 2017-10-13 MED ORDER — VENLAFAXINE HCL ER 75 MG PO CP24
75.0000 mg | ORAL_CAPSULE | Freq: Every day | ORAL | Status: DC
  Administered 2017-10-14 – 2017-10-17 (×4): 75 mg via ORAL
  Filled 2017-10-13 (×3): qty 1
  Filled 2017-10-13: qty 7
  Filled 2017-10-13 (×3): qty 1

## 2017-10-13 MED ORDER — NICOTINE POLACRILEX 2 MG MT GUM
2.0000 mg | CHEWING_GUM | OROMUCOSAL | Status: DC | PRN
  Administered 2017-10-13 – 2017-10-17 (×11): 2 mg via ORAL
  Filled 2017-10-13 (×4): qty 1

## 2017-10-13 NOTE — Progress Notes (Signed)
D.  Pt pleasant on approach, in room.  Pt did not attend evening wrap up group, reported that she was feeling tired.  Pt stated that she did not wish to take her HS medication until 2200 but at that time Pt was asleep.  Pt had denied SI/HI/AVH.  A.  Support and encouragement offered  R.  Pt remains safe on the unit, will continue to monitor.
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
## 2017-10-13 NOTE — Progress Notes (Signed)
Pt attended goals group. Pt goals for today is to stay out of bed.

## 2017-10-13 NOTE — Progress Notes (Signed)
Patient ID: Charlotte Strong, female   DOB: July 20, 1975, 42 y.o.   MRN: [id number] Admission Note  Pt is a 42 year old female admitted onto the 500 I/P adult unit on a voluntary basis. On admission, Pt was fidgety and restless and appears to be withdrawing. Pt reported that she has been using several quantity drugs including cocaine, Xanax and alcohol with the last 2 days; UDS was positive for benzos, opiate, cocaine and THC. Pt also endorsed severe anxiety, depression and generalized body pain; "my whole body aches because of my withdrawals." Pt denied AVH at this time however, stated that she hallucinates when she is intoxicated. Support, encouragement, and safe environment provided.  15-minute safety checks initiated and continued. Pt remained calm and cooperative through the admission process. Pt remained alert and oriented through the admission process. Pt searched and no contrabands found.

## 2017-10-13 NOTE — [facility] Group Notes (Signed)
[facility] Group Notes:  (Nursing)  Date:  10/13/2017  Time: 1:00 PM Type of Therapy:  Nurse Education  Participation Level:  Did Not Attend  Participation Quality:  Did not attend  Affect:  Did not attend  Cognitive:  Did not attend  Insight:  None  Engagement in Group:  None  Modes of Intervention:  Did not attend  Summary of Progress/Problems:  Shela Nevin 10/13/2017, 2:18 PM

## 2017-10-13 NOTE — [facility] Group Notes (Addendum)
LCSW Group Therapy Note  10/13/2017    9:15 - 10:15 AM               Type of Therapy and Topic:  Group Therapy: Anger Cues and Responses  Participation Level:  Active  In this group, patients learned how to recognize the physical, cognitive, emotional, and behavioral responses they have to anger-provoking situations.  They identified a recent time they became angry and how they reacted.  They analyzed how their reaction was possibly beneficial and how it was possibly unhelpful.  The group discussed anger warning signs and how to know when our anger can potentially become a problem. The group will discuss how our thoughts impact our feelings which in result affect our behaviors. Patients will learn thought replacement and explore alternative emotions in addition to feeling anger.   Therapeutic Goals: 1. Patients will remember their last incident of anger and how they felt emotionally and physically, what their thoughts were at the time, and how they behaved. 2. Patients will identify how their behavior at that time worked for them, as well as how it worked against them. 3. Patients will explore how their body, mind and feelings play a role with anger. 4. Patients will learn that anger itself is normal and cannot be eliminated, and that healthier reactions can assist with resolving conflict rather than worsening situations. 5. Patients will learn thought replacement and discuss how to implement using scenarios provided by CSW  Summary of Patient Progress:  Patient was engaged and participated throughout the group session. The patient shared that her most recent time of anger was on Monday and said she was angry talking with her husband. Patient denies anger and reports she tries to help her husband with his anger which creates anxiety for her.  Therapeutic Modalities:   Cognitive Behavioral Therapy  Shellia Cleverly, LCSW  10/13/2017 12:16 PM

## 2017-10-13 NOTE — Tx Team (Signed)
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  The patient and family have been given the opportunity to ask questions and make suggestions.  Eveline Keto, RN 10/13/2017, 12:13 AM

## 2017-10-13 NOTE — Progress Notes (Addendum)
D. Pt presents with an anxious affect and congruent mood. Pt reports mild withdrawal symptoms (generalized achy, cramps)  Pt observed attending am group and states that her goal today is "to stay out of bed". Per pt's self inventory, pt rates her depression, hopelessness and anxiety an 8/6/8, respectively. Pt currently denies SI/HI and AV hallucinations.A. Labs and vitals monitored. Pt compliant with  medications. Pt supported emotionally and encouraged to express concerns and ask questions.   R. Pt remains safe with 15 minute checks. Will continue POC.
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
## 2017-10-13 NOTE — Plan of Care (Signed)
  Problem: Self-Concept: Goal: Level of anxiety will decrease Outcome: Progressing   Problem: Health Behavior/Discharge Planning: Goal: Compliance with therapeutic regimen will improve Outcome: Progressing

## 2017-10-14 DIAGNOSIS — T1491XA Suicide attempt, initial encounter: Secondary | ICD-10-CM

## 2017-10-14 DIAGNOSIS — F1290 Cannabis use, unspecified, uncomplicated: Secondary | ICD-10-CM

## 2017-10-14 DIAGNOSIS — F419 Anxiety disorder, unspecified: Secondary | ICD-10-CM

## 2017-10-14 DIAGNOSIS — F1099 Alcohol use, unspecified with unspecified alcohol-induced disorder: Secondary | ICD-10-CM

## 2017-10-14 DIAGNOSIS — T424X2A Poisoning by benzodiazepines, intentional self-harm, initial encounter: Secondary | ICD-10-CM

## 2017-10-14 MED ORDER — IBUPROFEN 600 MG PO TABS
600.0000 mg | ORAL_TABLET | Freq: Four times a day (QID) | ORAL | Status: DC | PRN
  Administered 2017-10-14: 600 mg via ORAL
  Filled 2017-10-14: qty 1
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
## 2017-10-14 NOTE — Progress Notes (Signed)
D Pt is observed OOB UAL on the 400 hall today. She tolerates this well. She is quiet, shy-like. ? If she is actually  engaged in her recovery. She says " I don't really wanna go to group or be here..I just wanna be left alone". Pt seen speaking with same  female patient ( after they are both transferred back to the same hall( where they were originally housed 2' diagnosis and moved 2' bldg issue 10/13/17). the patient and the female patient were observed standing at the end of the 300 hall - away from both of their prospective roms. Writer encouraged both patients to stay focused on their prospective recoveries:       A Pt did complete her daily assessment first thing this am and on this she wrote she deneid experiecning SI this am and on tis she wrote she denied SI today and she rated her depression, hopelessness and anxeity " 5/5/5",respectively.      R Pt did attend her am Goals group, but not able to identify personal goal for the day.

## 2017-10-14 NOTE — Progress Notes (Signed)
Patient did attend the evening speaker AA meeting but came in and out of group. Pt reported having heard the speaker before and wanting to walk the hall. Pt walked the hall and would pause by another patients room looking in from the hallway. When other patient came out of room they were both asked to either go to their own rooms or to group. This patient returned to group and the other to his room.

## 2017-10-14 NOTE — Progress Notes (Signed)
Pt did attend orientation/goals group this morning. However, pt did not participate. Said she did not have a goal. Seemed slightly agitated

## 2017-10-14 NOTE — Plan of Care (Signed)
  Problem: Education: Goal: Ability to state activities that reduce stress will improve 10/14/2017 1141 by Rutherford Guys, RN Outcome: Progressing 10/14/2017 1111 by Rutherford Guys, RN Outcome: Progressing 10/14/2017 1110 by Rutherford Guys, RN Outcome: Progressing   Problem: Coping: Goal: Ability to identify and develop effective coping behavior will improve Outcome: Progressing

## 2017-10-14 NOTE — [facility] Group Notes (Signed)
[facility] Group Notes:  (Nursing)  Date:  10/14/2017  Time:  Type of Therapy:  Orientation/ Goal Setting  Participation Level:  Active  Participation Quality:  Appropriate  Affect:  Appropriate  Cognitive:  Alert and Appropriate  Insight:  Appropriate  Engagement in Group:  Engaged  Modes of Intervention:  Discussion Orientation  Summary of Progress/Problems:  Shela Nevin 10/14/2017, 9:56 AM

## 2017-10-14 NOTE — Plan of Care (Signed)
  Problem: Safety: Goal: Periods of time without injury will increase Outcome: Progressing Note:  Pt has not harmed self or others tonight.  She denies SI/HI and verbally contracts for safety.    

## 2017-10-14 NOTE — Progress Notes (Signed)
D: Pt was in dayroom upon initial approach.  She presents with appropriate affect and mood.  Describes her day as "good" and reports her goal is to "sleep throughout the whole night."  Denies SI/HI and hallucinations.  Reports bilateral leg pain of 5/10.  Pt has been interacting appropriately with peers and staff.    A: Introduced self to pt.  Actively listened to pt and offered support and encouragement.  Medication administered per order.  PRN medication administered for anxiety and pain.  Q15 minute safety checks maintained.  R: Pt is compliant with medications.  She verbally contracts for safety and reports she will inform staff of needs and concerns.  Will continue to monitor and assess.

## 2017-10-14 NOTE — Progress Notes (Signed)
Nutrition Brief Note  Patient identified on the Malnutrition Screening Tool (MST) Report  Pt with history of fluctuating weights. Pt has been ordered Ensure supplements, will continue.  Wt Readings from Last 15 Encounters:  10/12/17 120 lb (54.4 kg)  09/13/17 125 lb (56.7 kg)  09/13/17 130 lb (59 kg)  03/15/17 108 lb (49 kg)  03/15/17 115 lb (52.2 kg)  09/10/15 127 lb (57.6 kg)  05/27/15 123 lb (55.8 kg)  10/14/14 125 lb 3.5 oz (56.8 kg)  10/04/14 145 lb 1 oz (65.8 kg)  10/07/13 115 lb (52.2 kg)  07/01/13 130 lb (59 kg)  12/11/11 121 lb (54.9 kg)  09/17/11 131 lb (59.4 kg)    Body mass index is 19.37 kg/m. Patient meets criteria for normal based on current BMI.   Labs and medications reviewed.   No nutrition interventions warranted at this time. If nutrition issues arise, please consult RD.   Tilda Franco, MS, RD, LDN [facility] Inpatient Clinical Dietitian Pager: [phone] After Hours Pager: [phone]

## 2017-10-14 NOTE — [facility] Group Notes (Signed)
10/14/2017                   Type of Therapy and Topic:  Group Therapy: Anger Cues and Responses  Participation Level:  Minimal  In this group, patients learned how to define boundaries, discussed the different types or boundaries with examples.  They identified times that boundaries had been violated and how they reacted.  They analyzed how their reaction was possibly beneficial and how it was possibly unhelpful.  The group discussed how to set boundaries, respect others boundaries and communicate their boundaries. The group utilized a role play age appropriate scenario and discussed how each person in the scenario could have reacted differently. Patients will explore discussion questions that address media influence and why it is hard to set boundaries.   Therapeutic Goals: 1. Patients will define boundaries and explore (physical, personal space and language boundaries)  2. Patients will remember their last incident where their boundaries were violated and how they behaved 3. Patients will practice empathy and understanding of other's boundaries and learn from others in group 4. Patients will explore how they may have crossed another person's boundaries in the past.  5. Patients will learn healthy ways to set and communicate boundaries. 6. Patients will actively engage in group utilizing role play   Summary of Patient Progress:  Patient came to the group and reports that when she sets boundaries she tends to "delete people from social media and their phone numbers." Pt. did not stay the entire time.  Therapeutic Modalities:   Cognitive Behavioral Therapy  Charlotte Shears, LCSW  10/14/2017 12:17 PM

## 2017-10-15 MED ORDER — OLANZAPINE 5 MG PO TABS
5.0000 mg | ORAL_TABLET | Freq: Every day | ORAL | Status: DC
  Administered 2017-10-15 – 2017-10-16 (×2): 5 mg via ORAL
  Filled 2017-10-15 (×3): qty 1
  Filled 2017-10-15: qty 7
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
## 2017-10-15 NOTE — Tx Team (Signed)
Interdisciplinary Treatment and Diagnostic Plan Update  10/15/2017 Time of Session: 9:50am Charlotte Strong MRN: [id number]  Principal Diagnosis: MDD (major depressive disorder), recurrent episode, severe (HCC)  Secondary Diagnoses: Principal Problem:   MDD (major depressive disorder), recurrent episode, severe (HCC)   Current Medications:  Current Facility-Administered Medications  Medication Dose Route Frequency Provider Last Rate Last Dose  . alum & mag hydroxide-simeth (MAALOX/MYLANTA) 200-200-20 MG/5ML suspension 30 mL  30 mL Oral Q4H PRN Money, Gerlene Burdock, FNP      . feeding supplement (ENSURE ENLIVE) (ENSURE ENLIVE) liquid 237 mL  237 mL Oral BID BM Cobos, Fernando A, MD   237 mL at 10/15/17 1057  . hydrOXYzine (ATARAX/VISTARIL) tablet 25 mg  25 mg Oral Q6H PRN Money, Gerlene Burdock, FNP   25 mg at 10/14/17 2149  . ibuprofen (ADVIL,MOTRIN) tablet 600 mg  600 mg Oral Q6H PRN Nira Conn A, NP   600 mg at 10/14/17 2149  . loperamide (IMODIUM) capsule 2-4 mg  2-4 mg Oral PRN Money, Gerlene Burdock, FNP      . LORazepam (ATIVAN) tablet 1 mg  1 mg Oral Q6H PRN Money, Gerlene Burdock, FNP   1 mg at 10/13/17 4098  . LORazepam (ATIVAN) tablet 1 mg  1 mg Oral TID Money, Gerlene Burdock, FNP   1 mg at 10/15/17 0809   Followed by  . LORazepam (ATIVAN) tablet 1 mg  1 mg Oral BID Money, Gerlene Burdock, FNP       Followed by  . [START ON 10/17/2017] LORazepam (ATIVAN) tablet 1 mg  1 mg Oral Daily Money, Travis B, FNP      . magnesium hydroxide (MILK OF MAGNESIA) suspension 30 mL  30 mL Oral Daily PRN Money, Feliz Beam B, FNP   30 mL at 10/15/17 1058  . mirtazapine (REMERON) tablet 7.5 mg  7.5 mg Oral QHS Money, Travis B, FNP   7.5 mg at 10/14/17 2149  . multivitamin with minerals tablet 1 tablet  1 tablet Oral Daily Money, Gerlene Burdock, FNP   1 tablet at 10/15/17 [phone]  . nicotine polacrilex (NICORETTE) gum 2 mg  2 mg Oral PRN Cobos, Rockey Situ, MD   2 mg at 10/15/17 0811  . OLANZapine (ZYPREXA) tablet 2.5 mg  2.5 mg Oral QHS Cobos, Rockey Situ, MD   2.5 mg at 10/14/17 2149  . ondansetron (ZOFRAN-ODT) disintegrating tablet 4 mg  4 mg Oral Q6H PRN Money, Gerlene Burdock, FNP      . pantoprazole (PROTONIX) EC tablet 20 mg  20 mg Oral Daily Money, Gerlene Burdock, FNP   20 mg at 10/15/17 4782  . thiamine (VITAMIN B-1) tablet 100 mg  100 mg Oral Daily Money, Gerlene Burdock, FNP   100 mg at 10/15/17 9562  . venlafaxine XR (EFFEXOR-XR) 24 hr capsule 75 mg  75 mg Oral Daily Cobos, Rockey Situ, MD   75 mg at 10/15/17 0808   PTA Medications: Medications Prior to Admission  Medication Sig Dispense Refill Last Dose  . albuterol (PROVENTIL HFA;VENTOLIN HFA) 108 (90 Base) MCG/ACT inhaler Inhale 1-2 puffs into the lungs every 6 (six) hours as needed for wheezing or shortness of breath.   10/11/2017 at Unknown time  . gabapentin (NEURONTIN) 100 MG capsule Take 1 capsule (100 mg total) by mouth 3 (three) times daily. For agitation 90 capsule 0 Past Week at Unknown time  . hydrOXYzine (ATARAX/VISTARIL) 25 MG tablet Take 1 tablet (25 mg total) by mouth 3 (three) times daily as needed for anxiety.  60 tablet 0 Past Week at Unknown time  . ibuprofen (ADVIL,MOTRIN) 200 MG tablet Take 400 mg by mouth every 6 (six) hours as needed for headache or mild pain.     Marland Kitchen loratadine (CLARITIN) 10 MG tablet Take 1 tablet (10 mg total) by mouth daily. (May buy from over the counter): For allergies   Past Week at Unknown time  . mirtazapine (REMERON) 7.5 MG tablet Take 1 tablet (7.5 mg total) by mouth at bedtime. For sleep 30 tablet 0 Past Week at Unknown time  . Multiple Vitamin (MULTIVITAMIN WITH MINERALS) TABS tablet Take 1 tablet by mouth daily.     Marland Kitchen OLANZapine (ZYPREXA) 5 MG tablet Take 1 tablet (5 mg total) by mouth at bedtime. For mood control 30 tablet 0 Past Week at Unknown time  . pantoprazole (PROTONIX) 20 MG tablet Take 1 tablet (20 mg total) by mouth daily. For acid reflux 30 tablet 0 Past Week at Unknown time  . venlafaxine XR (EFFEXOR-XR) 75 MG 24 hr capsule Take 1 capsule (75  mg total) by mouth daily with breakfast. For depression 30 capsule 0 Past Week at Unknown time  . nicotine (NICODERM CQ - DOSED IN MG/24 HOURS) 21 mg/24hr patch Place 1 patch (21 mg total) onto the skin daily. (May buy from over the counter): For amoking cessation 28 patch 0     Patient Stressors: Financial difficulties Marital or family conflict Medication change or noncompliance Occupational concerns Substance abuse Traumatic event  Patient Strengths: Capable of independent living Communication skills Physical Health Supportive family/friends  Treatment Modalities: Medication Management, Group therapy, Case management,  1 to 1 session with clinician, Psychoeducation, Recreational therapy.   Physician Treatment Plan for Primary Diagnosis: MDD (major depressive disorder), recurrent episode, severe (HCC) Long Term Goal(s): Improvement in symptoms so as ready for discharge Improvement in symptoms so as ready for discharge   Short Term Goals: Ability to identify triggers associated with substance abuse/mental health issues will improve Ability to identify changes in lifestyle to reduce recurrence of condition will improve Ability to verbalize feelings will improve Ability to disclose and discuss suicidal ideas Ability to demonstrate self-control will improve Ability to identify and develop effective coping behaviors will improve Ability to maintain clinical measurements within normal limits will improve  Medication Management: Evaluate patient's response, side effects, and tolerance of medication regimen.  Therapeutic Interventions: 1 to 1 sessions, Unit Group sessions and Medication administration.  Evaluation of Outcomes: Progressing  Physician Treatment Plan for Secondary Diagnosis: Principal Problem:   MDD (major depressive disorder), recurrent episode, severe (HCC)  Long Term Goal(s): Improvement in symptoms so as ready for discharge Improvement in symptoms so as ready for  discharge   Short Term Goals: Ability to identify triggers associated with substance abuse/mental health issues will improve Ability to identify changes in lifestyle to reduce recurrence of condition will improve Ability to verbalize feelings will improve Ability to disclose and discuss suicidal ideas Ability to demonstrate self-control will improve Ability to identify and develop effective coping behaviors will improve Ability to maintain clinical measurements within normal limits will improve     Medication Management: Evaluate patient's response, side effects, and tolerance of medication regimen.  Therapeutic Interventions: 1 to 1 sessions, Unit Group sessions and Medication administration.  Evaluation of Outcomes: Progressing   RN Treatment Plan for Primary Diagnosis: MDD (major depressive disorder), recurrent episode, severe (HCC) Long Term Goal(s): Knowledge of disease and therapeutic regimen to maintain health will improve  Short Term Goals: Ability to  remain free from injury will improve, Ability to verbalize frustration and anger appropriately will improve, Ability to demonstrate self-control, Ability to participate in decision making will improve, Ability to verbalize feelings will improve, Ability to disclose and discuss suicidal ideas, Ability to identify and develop effective coping behaviors will improve and Compliance with prescribed medications will improve  Medication Management: RN will administer medications as ordered by provider, will assess and evaluate patient's response and provide education to patient for prescribed medication. RN will report any adverse and/or side effects to prescribing provider.  Therapeutic Interventions: 1 on 1 counseling sessions, Psychoeducation, Medication administration, Evaluate responses to treatment, Monitor vital signs and CBGs as ordered, Perform/monitor CIWA, COWS, AIMS and Fall Risk screenings as ordered, Perform wound care treatments  as ordered.  Evaluation of Outcomes: Progressing   LCSW Treatment Plan for Primary Diagnosis: MDD (major depressive disorder), recurrent episode, severe (HCC) Long Term Goal(s): Safe transition to appropriate next level of care at discharge, Engage patient in therapeutic group addressing interpersonal concerns.  Short Term Goals: Engage patient in aftercare planning with referrals and resources, Increase social support, Increase ability to appropriately verbalize feelings, Increase emotional regulation, Facilitate acceptance of mental health diagnosis and concerns, Facilitate patient progression through stages of change regarding substance use diagnoses and concerns, Identify triggers associated with mental health/substance abuse issues and Increase skills for wellness and recovery  Therapeutic Interventions: Assess for all discharge needs, 1 to 1 time with Social worker, Explore available resources and support systems, Assess for adequacy in community support network, Educate family and significant other(s) on suicide prevention, Complete Psychosocial Assessment, Interpersonal group therapy.  Evaluation of Outcomes: Progressing   Progress in Treatment: Attending groups: Yes. Participating in groups: Yes. Minimally Taking medication as prescribed: Yes. Toleration medication: Yes. Family/Significant other contact made: No, will contact:  patient refused consent Patient understands diagnosis: Yes. Discussing patient identified problems/goals with staff: Yes. Medical problems stabilized or resolved: Yes. Denies suicidal/homicidal ideation: Yes. Issues/concerns per patient self-inventory: No. Other:   New problem(s) identified: None   New Short Term/Long Term Goal(s):Detox, medication stabilization, elimination of SI thoughts, development of comprehensive mental wellness plan.  Patient Goals: "to detox and get to [facility]"  Discharge Plan or Barriers: Patient plans to return home in  [place] and follow up with [facility] for outpatient medication management and therapy services at discharge.   Reason for Continuation of Hospitalization: Anxiety Medication stabilization  Estimated Length of Stay: Wednesday, 10/17/17  Attendees: Patient: Charlotte Strong 10/15/2017 11:50 AM  Physician: Dr. Nehemiah Massed, MD 10/15/2017 11:50 AM  Nursing: Meriam Sprague.Kirtland Bouchard, RN 10/15/2017 11:50 AM  RN Care Manager: Juliann Pares 10/15/2017 11:50 AM  Social Worker: Baldo Daub, LCSWA 10/15/2017 11:50 AM  Recreational Therapist: X 10/15/2017 11:50 AM  Other: X 10/15/2017 11:50 AM  Other: X 10/15/2017 11:50 AM  Other:X 10/15/2017 11:50 AM    Scribe for Treatment Team: Maeola Sarah, LCSWA 10/15/2017 11:50 AM

## 2017-10-15 NOTE — [facility] Group Notes (Signed)
LCSW Group Therapy Note 10/15/2017 1:03 PM  Type of Therapy and Topic: Group Therapy: Overcoming Obstacles  Participation Level: Did Not Attend  Description of Group:  In this group patients will be encouraged to explore what they see as obstacles to their own wellness and recovery. They will be guided to discuss their thoughts, feelings, and behaviors related to these obstacles. The group will process together ways to cope with barriers, with attention given to specific choices patients can make. Each patient will be challenged to identify changes they are motivated to make in order to overcome their obstacles. This group will be process-oriented, with patients participating in exploration of their own experiences as well as giving and receiving support and challenge from other group members.  Therapeutic Goals: 1. Patient will identify personal and current obstacles as they relate to admission. 2. Patient will identify barriers that currently interfere with their wellness or overcoming obstacles.  3. Patient will identify feelings, thought process and behaviors related to these barriers. 4. Patient will identify two changes they are willing to make to overcome these obstacles:   Summary of Patient Progress  Invited, chose not to attend.    Therapeutic Modalities:  Cognitive Behavioral Therapy Solution Focused Therapy Motivational Interviewing Relapse Prevention Therapy   Rori Goar LCSWA Clinical Social Worker   

## 2017-10-15 NOTE — Plan of Care (Signed)
Nurse discussed depression, anxiety, coping skills with patient.  

## 2017-10-15 NOTE — Progress Notes (Signed)
Recreation Therapy Notes  Date: 5.6.19 Time: 0930 Location: [address] Dayroom  Group Topic: Stress Management  Goal Area(s) Addresses:  Patient will verbalize importance of using healthy stress management.  Patient will identify positive emotions associated with healthy stress management.   Behavioral Response: Engaged  Intervention: Stress Management  Activity :  Guided Imagery.  LRT introduced the stress management technique of guided imagery.  LRT read a script that guided patients through a peaceful outing in a meadow.  Patients were to follow along as LRT read script to engage in activity.  Education:  Stress Management, Discharge Planning.   Education Outcome: Acknowledges edcuation/In group clarification offered/Needs additional education  Clinical Observations/Feedback: Pt attended and participated in group.     Caroll Rancher, LRT/CTRS         Caroll Rancher A 10/15/2017 10:52 AM

## 2017-10-15 NOTE — Progress Notes (Signed)
D:  Patient's self inventory sheet, patient sleeps good, sleep medication helpful.  Good appetite, normal energy level, good concentration.  Rated depression and anxiety 5, hopeless 3.  Withdrawals, tremors, runny nose.  Denied SI.  Physical problems, headaches, worst pain #4 in past 24 hours, pain medication helpful.  Goal is out patient.  Plans to talk to SW.  Does have discharge plans. A:  Medications administered per MD orders.  Emotional support and encouragement given patient. R:  Patient denied SI and HI, contracts for safety.  Denied A/V hallucinations.  Safety maintained with 15 minute checks.  Patient has poor boundaries while talking to female peers.  Patient talks about partying while talking to peers.

## 2017-10-15 NOTE — Progress Notes (Signed)
Nursing note 7p-7a  Assuming care- pt resting in bed with eyes closed. Respirations even and unlabored. Pt continues to remain safe on the unit/ Observed by 15 min rounds. RN will continue to monitor.

## 2017-10-16 DIAGNOSIS — R450 Nervousness: Secondary | ICD-10-CM

## 2017-10-16 DIAGNOSIS — B182 Chronic viral hepatitis C: Secondary | ICD-10-CM

## 2017-10-16 DIAGNOSIS — J45909 Unspecified asthma, uncomplicated: Secondary | ICD-10-CM

## 2017-10-16 DIAGNOSIS — K219 Gastro-esophageal reflux disease without esophagitis: Secondary | ICD-10-CM

## 2017-10-16 DIAGNOSIS — Z62811 Personal history of psychological abuse in childhood: Secondary | ICD-10-CM

## 2017-10-16 DIAGNOSIS — Z62810 Personal history of physical and sexual abuse in childhood: Secondary | ICD-10-CM

## 2017-10-16 DIAGNOSIS — F1920 Other psychoactive substance dependence, uncomplicated: Secondary | ICD-10-CM

## 2017-10-16 LAB — HEPATIC FUNCTION PANEL
ALK PHOS: 80 U/L (ref 38–126)
ALT: 167 U/L — ABNORMAL HIGH (ref 14–54)
AST: 79 U/L — ABNORMAL HIGH (ref 15–41)
Albumin: 4 g/dL (ref 3.5–5.0)
BILIRUBIN DIRECT: 0.1 mg/dL (ref 0.1–0.5)
BILIRUBIN TOTAL: 0.5 mg/dL (ref 0.3–1.2)
Indirect Bilirubin: 0.4 mg/dL (ref 0.3–0.9)
Total Protein: 7.6 g/dL (ref 6.5–8.1)

## 2017-10-16 NOTE — Progress Notes (Signed)
D: Pt denies HI/AV hallucinations. Pt observed interacting with peers and playing cards. Patient is for discharge but expressed she doesn't feel ready to leave. Patient is passive SI but contracts for safety. A: Pt was offered support and encouragement. Pt was given scheduled medications. Pt was encourage to attend groups. Q 15 minute checks were done for safety.  R:Pt attends groups and interacts well with peers and staff. Pt is taking medication. Pt has no complaints.Pt receptive to treatment and safety maintained on unit.

## 2017-10-16 NOTE — Plan of Care (Signed)
Nurse discussed depression, anxiety, coping skills with patient.  

## 2017-10-16 NOTE — Progress Notes (Signed)
Adult Psychoeducational Group Note  Date:  10/16/2017 Time:  5:15 PM  Group Topic/Focus:  Self Care:   The focus of this group is to help patients understand the importance of self-care in order to improve or restore emotional, physical, spiritual, interpersonal, and financial health.  Participation Level:  Minimal  Participation Quality:  Attentive  Affect:  Appropriate  Cognitive:  Appropriate  Insight: Appropriate  Engagement in Group:  Engaged  Modes of Intervention:  Discussion  Additional Comments:  Pt attended group and was listening and provided feedback to the group members with words of encouragement about supporting themselves. Pt did not share much but share an example about how she does practice self care.   Charlotte Strong [name] 10/16/2017, 5:15 PM
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
## 2017-10-16 NOTE — Plan of Care (Signed)
Nurse discussed anxiety, depression, coping skills with patient. 

## 2017-10-16 NOTE — Progress Notes (Signed)
Recreation Therapy Notes  Animal-Assisted Activity (AAA) Program Checklist/Progress Notes Patient Eligibility Criteria Checklist & Daily Group note for Rec Tx Intervention  Date: 5.7.19 Time: 1445 Location: 400 Hall Dayroom   AAA/T Program Assumption of Risk Form signed by Patient/ or Parent Legal Guardian YES   Patient is free of allergies or sever asthma YES   Patient reports no fear of animals YES   Patient reports no history of cruelty to animals YES   Patient understands his/her participation is voluntary YES   Patient washes hands before animal contact YES   Patient washes hands after animal contact YES   Behavioral Response: Engaged  Education: Hand Washing, Appropriate Animal Interaction   Education Outcome: Acknowledges understanding/In group clarification offered/Needs additional education.   Clinical Observations/Feedback: Pt attended group.    Raciel Caffrey, LRT/CTRS         Mary-Ann Pennella A 10/16/2017 3:50 PM 

## 2017-10-16 NOTE — Progress Notes (Signed)
D:  Patient's self inventory sheet, patient sleeps good, sleep medication helpful.  Good appetite, normal energy level, good concentration.  Rated depression, hopeless and anxiety 8.  Withdrawals, tremors, runny nose.  SI, contracts for safety.  Physical problems, headaches, pain, leg cramps.  Worst pain in past 24 hours is #6, legs, arms, back.  No pain medicine.  Goal is talk to MD about SI thoughts.  Plans to see MD, give her a few extra days.  Does have discharge plans. A:  Medications administered per MD orders.  Emotional support and encouragement given patient. R:  Denied HI.  Denied A/V hallucinations.  SI, contracts for safety  Safety maintained with 15 minute checks.

## 2017-10-16 NOTE — [facility] Suicide Risk Assessment (Signed)
[facility] INPATIENT:  Family/Significant Other Suicide Prevention Education  Suicide Prevention Education:  Patient Refusal for Family/Significant Other Suicide Prevention Education: The patient Charlotte Strong has refused to provide written consent for family/significant other to be provided Family/Significant Other Suicide Prevention Education during admission and/or prior to discharge.  Physician notified.  SPE completed with pt, as pt refused to consent to family contact. SPI pamphlet provided to pt and pt was encouraged to share information with support network, ask questions, and talk about any concerns relating to SPE. Pt denies access to guns/firearms and verbalized understanding of information provided. Mobile Crisis information also provided to pt.   Olson Lucarelli N Smart LCSW 10/16/2017, 8:39 AM

## 2017-10-16 NOTE — [facility] Group Notes (Signed)
[facility] Group Notes:  (Nursing/MHT/Case Management/Adjunct)  Date:  10/16/2017  Time:  3:15 pm  Type of Therapy:  Psychoeducational Skills  Participation Level:  Active  Participation Quality:  Appropriate  Affect:  Appropriate  Cognitive:  Appropriate  Insight:  Appropriate  Engagement in Group:  Engaged  Modes of Intervention:  Education  Summary of Progress/Problems: Patient was alert and cooperative during group.    Earline Mayotte 10/16/2017, 4:11 PM

## 2017-10-16 NOTE — Progress Notes (Addendum)
Patient ID: Charlotte Strong, female   DOB: [birth year], 42 y.o.   MRN: [id number]  Pt currently presents with a pleasant affect and cooperative behavior. Pt reports to writer that their goal is to "not taking any naps tomorrow." Pt states "I am probably going home tomorrow." Reports a good appetite. Pt reports good sleep with current medication regimen. Pt ambivalent about hospitalization and sobriety.  Pt provided with medications per providers orders. Pt's labs and vitals were monitored throughout the night. Pt given a 1:1 about emotional and mental status. Pt supported and encouraged to express concerns and questions. Pt educated on medications.  Pt's safety ensured with 15 minute and environmental checks. Pt currently denies SI/HI and A/V hallucinations. Pt verbally agrees to seek staff if SI/HI or A/VH occurs and to consult with staff before acting on any harmful thoughts. Will continue POC.

## 2017-10-16 NOTE — [facility] Group Notes (Signed)
[facility] Group Notes:  (Nursing/MHT/Case Management/Adjunct)  Date:  10/16/2017  Time:  3:15 pm  Type of Therapy:  Psychoeducational Group  Participation Level:  Active  Participation Quality:  Appropriate  Affect:  Appropriate  Cognitive:  Appropriate  Insight:  Appropriate  Engagement in Group:  Engaged  Modes of Intervention:  Discussion  Summary of Progress/Problems: Patient was alert and cooperative during group.    Charlotte Strong 10/16/2017, 4:12 PM

## 2017-10-17 MED ORDER — MIRTAZAPINE 7.5 MG PO TABS: 7.5000 mg | ORAL_TABLET | Freq: Every day | ORAL | 0 refills | Status: DC

## 2017-10-17 MED ORDER — ADULT MULTIVITAMIN W/MINERALS CH: 1.0000 | ORAL_TABLET | Freq: Every day | ORAL | 0 refills | Status: DC

## 2017-10-17 MED ORDER — VENLAFAXINE HCL ER 75 MG PO CP24: 75.0000 mg | ORAL_CAPSULE | Freq: Every day | ORAL | 0 refills | Status: DC

## 2017-10-17 MED ORDER — ALBUTEROL SULFATE HFA 108 (90 BASE) MCG/ACT IN AERS: 1.0000 | INHALATION_SPRAY | Freq: Four times a day (QID) | RESPIRATORY_TRACT | Status: DC | PRN

## 2017-10-17 MED ORDER — LORATADINE 10 MG PO TABS: 10.0000 mg | ORAL_TABLET | Freq: Every day | ORAL | Status: DC

## 2017-10-17 MED ORDER — GABAPENTIN 100 MG PO CAPS: 100.0000 mg | ORAL_CAPSULE | Freq: Three times a day (TID) | ORAL | 0 refills | Status: DC

## 2017-10-17 MED ORDER — OLANZAPINE 5 MG PO TABS: 5.0000 mg | ORAL_TABLET | Freq: Every day | ORAL | 0 refills | Status: DC

## 2017-10-17 MED ORDER — PANTOPRAZOLE SODIUM 20 MG PO TBEC: 20.0000 mg | DELAYED_RELEASE_TABLET | Freq: Every day | ORAL | 0 refills | Status: DC

## 2017-10-17 NOTE — Discharge Instructions (Signed)
Depression Screening Depression screening is a tool that your health care provider can use to learn if you have symptoms of depression. Depression is a common condition with many symptoms that are also often found in other conditions. Depression is treatable, but it must first be diagnosed. You may not know that certain feelings, thoughts, and behaviors that you are having can be symptoms of depression. Taking a depression screening test can help you and your health care provider decide if you need more assessment, or if you should be referred to a mental health care provider. What are the screening tests?  You may have a physical exam to see if another condition is affecting your mental health. You may have a blood or urine sample taken during the physical exam.  You may be interviewed using a screening tool that was developed from research, such as one of these: ? Patient Health Questionnaire (PHQ). This is a set of either 2 or 9 questions. A health care provider who has been trained to score this screening test uses a guide to assess if your symptoms suggest that you may have depression. ? Hamilton Depression Rating Scale (HAM-D). This is a set of either 17 or 24 questions. You may be asked to take it again during or after your treatment, to see if your depression has gotten better. ? Beck Depression Inventory (BDI). This is a set of 21 multiple choice questions. Your health care provider scores your answers to assess:  Your level of depression, ranging from mild to severe.  Your response to treatment.  Your health care provider may talk with you about your daily activities, such as eating, sleeping, work, and recreation, and ask if you have had any changes in activity.  Your health care provider may ask you to see a mental health specialist, such as a psychiatrist or psychologist, for more evaluation. Who should be screened for depression?  All adults, including adults with a family history  of a mental health disorder.  Adolescents who are 12-18 years old.  People who are recovering from a myocardial infarction (MI).  Pregnant women, or women who have given birth.  People who have a long-term (chronic) illness.  Anyone who has been diagnosed with another type of a mental health disorder.  Anyone who has symptoms that could show depression. What do my results mean? Your health care provider will review the results of your depression screening, physical exam, and lab tests. Positive screens suggest that you may have depression. Screening is the first step in getting the care that you may need. It is up to you to get your screening results. Ask your health care provider, or the department that is doing your screening tests, when your results will be ready. Talk with your health care provider about your results and diagnosis. A diagnosis of depression is made using the Diagnostic and Statistical Manual of Mental Disorders (DSM-V). This is a book that lists the number and type of symptoms that must be present for a health care provider to give a specific diagnosis.  Your health care provider may work with you to treat your symptoms of depression, or your health care provider may help you find a mental health provider who can assess, diagnose, and treat your depression. Get help right away if:  You have thoughts about hurting yourself or others. If you ever feel like you may hurt yourself or others, or have thoughts about taking your own life, get help right away. You can   go to your nearest emergency department or call:  Your local emergency services (911 in the [place]  A suicide crisis helpline, such as the National Suicide Prevention Lifeline at 1-800-273-8255. This is open 24 hours a day.  Summary  Depression screening is the first step in getting the help that you may need.  If your screening test shows symptoms of depression (is positive), your health care provider may ask  you to see a mental health provider.  Anyone who is age 12 or older should be screened for depression. This information is not intended to replace advice given to you by your health care provider. Make sure you discuss any questions you have with your health care provider. Document Released: 10/13/2016 Document Revised: 10/13/2016 Document Reviewed: 10/13/2016 Elsevier Interactive Patient Education  2018 [employer]  

## 2017-10-17 NOTE — Progress Notes (Signed)
Recreation Therapy Notes  Date: 5.8.19 Time: 0930 Location: 300 [address] Dayroom  Group Topic: Stress Management  Goal Area(s) Addresses:  Patient will verbalize importance of using healthy stress management.  Patient will identify positive emotions associated with healthy stress management.   Intervention: Stress Management  Activity :  Meditation.  LRT introduced the stress management technique of meditation.  LRT played a meditation that focused on the strength and resilience of mountains and how those characteristics can be used in daily life.  Patients were to follow along as meditation played.  Education:  Stress Management, Discharge Planning.   Education Outcome: Acknowledges edcuation/In group clarification offered/Needs additional education  Clinical Observations/Feedback: Pt did not attend group.      Charlotte Strong, LRT/CTRS         Charlotte Strong A 10/17/2017 11:43 AM 
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
## 2017-10-17 NOTE — Progress Notes (Signed)
Patient was discharged per order. AVS, SRA, medications, scripts and transition summary were all reviewed with patient. Pt was given an opportunity to ask questions and verbalized understanding of all discharge paperwork. Belongings were returned, and patient signed for receipt. Patient verbalized readiness for discharge and appeared in no acute distress when escorted to lobby.  

## 2017-10-17 NOTE — Progress Notes (Signed)
  [facility] Adult Case Management Discharge Plan :  Will you be returning to the same living situation after discharge:  Yes,  home At discharge, do you have transportation home?: Yes,  family member or bus Do you have the ability to pay for your medications: Yes,  mental health  Release of information consent forms completed and submitted to medical records by CSW.  Patient to Follow up at: Follow-up Information    [facility]. Go on 10/19/2017.   Specialty:  Behavioral Health Why:  Hospital follow-up appointment on Friday, 10/19/17 at 8:15am. Thank you.  Contact information: [address] [place] [address] [phone]           Next level of care provider has access to [facility] Link:no  Safety Planning and Suicide Prevention discussed: Yes,  SPE completed with pt; pt declined to consent to collateral contact. SPI pamphlet and Mobile Crisis information provided to pt.   Have you used any form of tobacco in the last 30 days? (Cigarettes, Smokeless Tobacco, Cigars, and/or Pipes): Yes  Has patient been referred to the Quitline?: Patient refused referral  Patient has been referred for addiction treatment: Yes  [employer], LCSW 10/17/2017, 8:47 AM

## 2017-10-17 NOTE — [facility] Suicide Risk Assessment (Signed)
[facility] Discharge Suicide Risk Assessment   Principal Problem: MDD (major depressive disorder), recurrent episode, severe (HCC) Discharge Diagnoses:  Patient Active Problem List   Diagnosis Date Noted  . Polysubstance dependence (HCC) [F19.20]   . Chronic hepatitis C without hepatic coma (HCC) [B18.2]   . MDD (major depressive disorder), recurrent episode, severe (HCC) [F33.2] 10/12/2017  . GERD (gastroesophageal reflux disease) [K21.9] 09/15/2017  . Major depressive disorder, single episode, severe without psychosis (HCC) [F32.2] 09/13/2017  . 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 Polysubstance dependence including opioid type drug, continuous use, with perceptual disturbance (HCC) [Z61.096] 09/24/2015  . 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 Substance abuse (HCC) [F19.10]   . Suicidal ideation [R45.851]   . Left foot drop [M21.372] 12/01/2014  . Left wrist drop [M21.332] 12/01/2014  . Substance induced mood disorder (HCC) [F19.94] 10/13/2014  . 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 Compartment syndrome of lower extremity (HCC) [T79.A29A] 10/08/2014  . Wheezing [R06.2]   . Neuropathic pain [M79.2]   . Depression with anxiety [F41.8]   . Palliative care encounter [Z51.5]   . Pain [R52]   . Encephalopathy acute [G93.40]   . Aspiration pneumonia (HCC) [J69.0]   . Altered mental status [R41.82] 09/30/2014  . Acute renal failure (HCC) [N17.9] 09/30/2014  . Rhabdomyolysis [M62.82] 09/30/2014  . Acute respiratory failure with hypoxia (HCC) [J96.01] 09/30/2014  . Lactic acidosis [E87.2] 09/30/2014    Total Time spent with patient: 30 minutes  Musculoskeletal: Strength & Muscle Tone: within normal limits Gait & Station: normal Patient leans: N/A  Psychiatric Specialty Exam: Review of Systems  All other systems reviewed and are  negative.   Blood pressure 107/89, pulse 89, temperature 98.3 F (36.8 C), temperature source Oral, resp. rate 16, height  (1.676 m), weight 54.4 kg (120 lb), last menstrual period 05/16/2017, SpO2 100 %.Body mass index is 19.37 kg/m.  General Appearance: Casual  Eye Contact::  Fair  Speech:  Normal Rate409  Volume:  Normal  Mood:  Euthymic  Affect:  Appropriate  Thought Process:  Coherent  Orientation:  Full (Time, Place, and Person)  Thought Content:  Logical  Suicidal Thoughts:  No  Homicidal Thoughts:  No  Memory:  Immediate;   Fair  Judgement:  Intact  Insight:  Fair  Psychomotor Activity:  Normal  Concentration:  Fair  Recall:  [employer] of Knowledge:Fair  Language: Good  Akathisia:  No  Handed:  Right  AIMS (if indicated):     Assets:  Communication Skills Desire for Improvement Resilience  Sleep:  Number of Hours: 6.75  Cognition: WNL  ADL's:  Intact   Mental Status Per Nursing Assessment::   On Admission:  Suicidal ideation indicated by patient  Demographic Factors:  Low socioeconomic status  Loss Factors: NA  Historical Factors: Impulsivity  Risk Reduction Factors:   Positive social support  Continued Clinical Symptoms:  Alcohol/Substance Abuse/Dependencies  Cognitive Features That Contribute To Risk:  None    Suicide Risk:  Minimal: No identifiable suicidal ideation.  Patients presenting with no risk factors but with morbid ruminations; may be classified as minimal risk based on the severity of the depressive symptoms  Follow-up Information    [facility]. Go on 10/19/2017.   Specialty:  Behavioral Health Why:  Hospital follow-up appointment on Friday, 10/19/17 at 8:15am. Thank you.  Contact information: [address] [place] [address] [phone]  Plan Of Care/Follow-up recommendations:  Activity:  ad lib  Antonieta Pert, MD 10/17/2017, 8:01 AM

## 2017-10-17 NOTE — Progress Notes (Signed)
Pt attended goals group today and participated. Pt goal for the day is to go home  

## 2017-10-20 ENCOUNTER — Encounter (HOSPITAL_COMMUNITY): Payer: Self-pay

## 2017-10-20 ENCOUNTER — Emergency Department (HOSPITAL_COMMUNITY)
Admission: EM | Admit: 2017-10-20 | Discharge: 2017-10-20 | Disposition: A | Discharge status: Home / Self Care | Payer: Self-pay | Attending: Emergency Medicine | Admitting: Emergency Medicine

## 2017-10-20 ENCOUNTER — Other Ambulatory Visit: Payer: Self-pay

## 2017-10-20 ENCOUNTER — Emergency Department (HOSPITAL_COMMUNITY): Payer: Self-pay

## 2017-10-20 DIAGNOSIS — R110 Nausea: Secondary | ICD-10-CM | POA: Insufficient documentation

## 2017-10-20 DIAGNOSIS — T401X1A Poisoning by heroin, accidental (unintentional), initial encounter: Secondary | ICD-10-CM | POA: Insufficient documentation

## 2017-10-20 DIAGNOSIS — F1910 Other psychoactive substance abuse, uncomplicated: Secondary | ICD-10-CM | POA: Insufficient documentation

## 2017-10-20 DIAGNOSIS — F17210 Nicotine dependence, cigarettes, uncomplicated: Secondary | ICD-10-CM | POA: Insufficient documentation

## 2017-10-20 DIAGNOSIS — Z79899 Other long term (current) drug therapy: Secondary | ICD-10-CM | POA: Insufficient documentation
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
## 2017-10-20 NOTE — ED Notes (Signed)
Discharge instructions and resource guide reviewed with patient. Pt given opportunity to ask questions and verbalizes understanding. Patient a/o x4, vss, denies pain at this time. Pt ambulatory upon discharge. Cab was called for pt.

## 2017-10-20 NOTE — ED Notes (Signed)
Pt ambulated to bathroom with no assistance and tolerated well.
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
## 2017-10-20 NOTE — Discharge Instructions (Signed)
Substance Abuse Treatment Programs ° °Intensive Outpatient Programs °[facility]     °[address]      °[place], [place]                   °[phone]      ° °[facility] °[address] °[place], [place] °[phone] ° °[facility]     °(Inpatient and outpatient)     °[address]           °[phone]   ° °[facility] °[phone] (Suboxone and Methadone) ° °[address]      °[place], [place] [address]      °[phone]      ° °[address] °[place], [place] °[phone] ° °[facility] (Outpatient/Inpatient, [facility])    °(insurance only) [phone]      °       °[facility] (Groups & Residential) °[place], [place] °[phone] ° °   °[facility]     °[address]     °[place], [place]      °[phone]      ° °[facility] (for caregivers and family) °[address] °[place], [place] °[phone] ° ° ° ° ° °Residential Treatment Programs °[facility]      °[address], [place], [place] [address]  °[phone]      ° °[facility] °[address] [place], [place] [address] °[phone] ° °[facility]        °[phone]      ° °[facility] °[phone] ° °[facility]             °[address]                                         °[place], [place]                                                °[phone] or [phone]                              ° °[facility] [place] °[address] °[place], [address] °[phone] ° °[facility]    °[address]      °, [place]     °[phone]      ° °[facility] °[address] °, [place] °[phone] ° °[facility]   °[address]     °[place], [place] [address]     °[phone]      °Admissions: 8am-3pm M-F ° °[facility] °[address] °[place],  [place] °[phone] ° °[facility]: Residential Program (90 Days)   °[place], [place]      °[phone] or [phone]    ° °[facility] °[place], [place] °(Walk in Hours over the weekend or by referral) ° °[facility] °[address], [place], [place] [address] °[phone] ° °Crisis Mobile: [facility]:  1-877-626-1772 (for crisis response 24 hours a day) °[facility] Hotline:      1-800-256-2452 °Outpatient Psychiatry and Counseling ° °[facility]: Mobile Crisis   Crisis Management 24 hours:  1-877-626-1772  [facility] sliding scale fee and walk in schedule: M-F 8am-12pm/1pm-3pm [address]  [place], [place] [address] [phone]  [facility] [address] [place], [place] [address] [phone]  [facility] (Formerly known [facility])- new patient walk-in appointments available Monday - Friday 8am -3pm.          [address] [place], [place] [address] [phone] or crisis line- [phone]  [facility]/ Intensive Outpatient Therapy Program [address] [place], [place] [address] [phone]  [facility]                  Crisis Services      [phone]      [address]     [place], [place] [address]                 [facility]   [facility] [phone] [address] [place], [place] [address]   [facility]          [address],  [place], [place] [address]       [phone]  [facility] [address] [place], [place] [address] [phone]  [facility]    [address]    [place], [place] [address]     [phone]       Parish McKinney, MD     [address]     [place] [address]     [phone]       [facility] [address] [place]  [address] [place] [address]  [facility]     [address]     [place], [place]      [phone]       [facility] Shamsher Ahluwalia, MD [address] [place], [place] [address] [phone]  [facility]     [address]     [place], [place] [address]     [phone]       [facility] [address] , [place] [address] [phone] Resources for Temporary Residential Assistance/Crisis Centers 

## 2017-11-04 ENCOUNTER — Encounter (HOSPITAL_COMMUNITY): Payer: Self-pay | Admitting: *Deleted

## 2017-11-04 ENCOUNTER — Inpatient Hospital Stay (HOSPITAL_COMMUNITY)
Admission: AD | Admit: 2017-11-04 | Discharge: 2017-11-08 | DRG: 885 | Disposition: A | Discharge status: Home / Self Care | Payer: No Typology Code available for payment source | Source: Intra-hospital | Attending: Psychiatry | Admitting: Psychiatry

## 2017-11-04 ENCOUNTER — Emergency Department (HOSPITAL_COMMUNITY)
Admission: EM | Admit: 2017-11-04 | Discharge: 2017-11-04 | Disposition: A | Discharge status: Other Acute Inpatient Hospital | Payer: Self-pay | Attending: Emergency Medicine | Admitting: Emergency Medicine

## 2017-11-04 ENCOUNTER — Other Ambulatory Visit: Payer: Self-pay

## 2017-11-04 DIAGNOSIS — Z811 Family history of alcohol abuse and dependence: Secondary | ICD-10-CM | POA: Diagnosis not present

## 2017-11-04 DIAGNOSIS — F1324 Sedative, hypnotic or anxiolytic dependence with sedative, hypnotic or anxiolytic-induced mood disorder: Secondary | ICD-10-CM | POA: Diagnosis present

## 2017-11-04 DIAGNOSIS — F1124 Opioid dependence with opioid-induced mood disorder: Secondary | ICD-10-CM | POA: Diagnosis present

## 2017-11-04 DIAGNOSIS — J45909 Unspecified asthma, uncomplicated: Secondary | ICD-10-CM | POA: Diagnosis present

## 2017-11-04 DIAGNOSIS — F17210 Nicotine dependence, cigarettes, uncomplicated: Secondary | ICD-10-CM | POA: Diagnosis not present

## 2017-11-04 DIAGNOSIS — R45851 Suicidal ideations: Secondary | ICD-10-CM | POA: Insufficient documentation

## 2017-11-04 DIAGNOSIS — Z915 Personal history of self-harm: Secondary | ICD-10-CM | POA: Diagnosis not present

## 2017-11-04 DIAGNOSIS — F1120 Opioid dependence, uncomplicated: Secondary | ICD-10-CM | POA: Diagnosis not present

## 2017-11-04 DIAGNOSIS — F332 Major depressive disorder, recurrent severe without psychotic features: Principal | ICD-10-CM | POA: Diagnosis present

## 2017-11-04 DIAGNOSIS — K219 Gastro-esophageal reflux disease without esophagitis: Secondary | ICD-10-CM | POA: Diagnosis present

## 2017-11-04 DIAGNOSIS — Z62810 Personal history of physical and sexual abuse in childhood: Secondary | ICD-10-CM | POA: Diagnosis not present

## 2017-11-04 DIAGNOSIS — F419 Anxiety disorder, unspecified: Secondary | ICD-10-CM | POA: Diagnosis not present

## 2017-11-04 DIAGNOSIS — Z818 Family history of other mental and behavioral disorders: Secondary | ICD-10-CM | POA: Diagnosis not present

## 2017-11-04 DIAGNOSIS — G4700 Insomnia, unspecified: Secondary | ICD-10-CM | POA: Diagnosis not present

## 2017-11-04 DIAGNOSIS — Z79899 Other long term (current) drug therapy: Secondary | ICD-10-CM

## 2017-11-04 DIAGNOSIS — F1910 Other psychoactive substance abuse, uncomplicated: Secondary | ICD-10-CM | POA: Insufficient documentation

## 2017-11-04 DIAGNOSIS — F1424 Cocaine dependence with cocaine-induced mood disorder: Secondary | ICD-10-CM | POA: Diagnosis present

## 2017-11-04 DIAGNOSIS — F1994 Other psychoactive substance use, unspecified with psychoactive substance-induced mood disorder: Secondary | ICD-10-CM | POA: Diagnosis not present

## 2017-11-04 DIAGNOSIS — F4310 Post-traumatic stress disorder, unspecified: Secondary | ICD-10-CM | POA: Diagnosis not present

## 2017-11-04 DIAGNOSIS — F1220 Cannabis dependence, uncomplicated: Secondary | ICD-10-CM | POA: Diagnosis present

## 2017-11-04 LAB — ETHANOL: Alcohol, Ethyl (B): <10 mg/dL (ref <10)

## 2017-11-04 LAB — COMPREHENSIVE METABOLIC PANEL
ALBUMIN: 4 g/dL (ref 3.5–5.0)
ALK PHOS: 73 U/L (ref 38–126)
ALT: 35 U/L (ref 14–54)
AST: 36 U/L (ref 15–41)
Anion gap: 11 (ref 5–15)
BILIRUBIN TOTAL: 0.8 mg/dL (ref 0.3–1.2)
BUN: 10 mg/dL (ref 6–20)
CALCIUM: 9.2 mg/dL (ref 8.9–10.3)
CO2: 25 mmol/L (ref 22–32)
CREATININE: 0.64 mg/dL (ref 0.44–1.00)
Chloride: 99 mmol/L — ABNORMAL LOW (ref 101–111)
GFR calc Af Amer: >60 mL/min (ref >60)
GFR calc non Af Amer: >60 mL/min (ref >60)
GLUCOSE: 110 mg/dL — AB (ref 65–99)
Potassium: 3 mmol/L — ABNORMAL LOW (ref 3.5–5.1)
SODIUM: 135 mmol/L (ref 135–145)
TOTAL PROTEIN: 7.2 g/dL (ref 6.5–8.1)

## 2017-11-04 LAB — RAPID URINE DRUG SCREEN, HOSP PERFORMED
Amphetamines: POSITIVE — AB
BARBITURATES: NOT DETECTED
Benzodiazepines: POSITIVE — AB
COCAINE: NOT DETECTED
Opiates: NOT DETECTED
TETRAHYDROCANNABINOL: POSITIVE — AB

## 2017-11-04 LAB — CBC
HEMATOCRIT: 40.9 % (ref 36.0–46.0)
HEMOGLOBIN: 14.1 g/dL (ref 12.0–15.0)
MCH: 32.4 pg (ref 26.0–34.0)
MCHC: 34.5 g/dL (ref 30.0–36.0)
MCV: 94 fL (ref 78.0–100.0)
Platelets: 318 10*3/uL (ref 150–400)
RBC: 4.35 MIL/uL (ref 3.87–5.11)
RDW: 13.7 % (ref 11.5–15.5)
WBC: 8.9 10*3/uL (ref 4.0–10.5)

## 2017-11-04 LAB — SALICYLATE LEVEL: Salicylate Lvl: <7 mg/dL (ref 2.8–30.0)

## 2017-11-04 LAB — ACETAMINOPHEN LEVEL: Acetaminophen (Tylenol), Serum: <10 ug/mL — ABNORMAL LOW (ref 10–30)

## 2017-11-04 LAB — PREGNANCY, URINE: PREG TEST UR: NEGATIVE

## 2017-11-04 MED ORDER — ALBUTEROL SULFATE HFA 108 (90 BASE) MCG/ACT IN AERS: 1.0000 | INHALATION_SPRAY | Freq: Four times a day (QID) | RESPIRATORY_TRACT | Status: DC | PRN

## 2017-11-04 MED ORDER — MIRTAZAPINE 7.5 MG PO TABS
7.5000 mg | ORAL_TABLET | Freq: Every day | ORAL | Status: DC
  Administered 2017-11-04 – 2017-11-07 (×4): 7.5 mg via ORAL
  Filled 2017-11-04 (×5): qty 1
  Filled 2017-11-04: qty 7
  Filled 2017-11-04: qty 1

## 2017-11-04 MED ORDER — VENLAFAXINE HCL ER 75 MG PO CP24
75.0000 mg | ORAL_CAPSULE | Freq: Every day | ORAL | Status: DC
  Administered 2017-11-05 – 2017-11-08 (×4): 75 mg via ORAL
  Filled 2017-11-04 (×5): qty 1
  Filled 2017-11-04: qty 7

## 2017-11-04 MED ORDER — GABAPENTIN 100 MG PO CAPS
100.0000 mg | ORAL_CAPSULE | Freq: Three times a day (TID) | ORAL | Status: DC
  Administered 2017-11-05 – 2017-11-08 (×11): 100 mg via ORAL
  Filled 2017-11-04 (×6): qty 1
  Filled 2017-11-04: qty 21
  Filled 2017-11-04 (×4): qty 1
  Filled 2017-11-04: qty 21
  Filled 2017-11-04 (×3): qty 1
  Filled 2017-11-04: qty 21

## 2017-11-04 MED ORDER — ALUM & MAG HYDROXIDE-SIMETH 200-200-20 MG/5ML PO SUSP: 30.0000 mL | ORAL | Status: DC | PRN

## 2017-11-04 MED ORDER — HYDROXYZINE HCL 25 MG PO TABS: 25.0000 mg | ORAL_TABLET | Freq: Four times a day (QID) | ORAL | Status: DC | PRN

## 2017-11-04 MED ORDER — ONDANSETRON 4 MG PO TBDP: 4.0000 mg | ORAL_TABLET | Freq: Four times a day (QID) | ORAL | Status: DC | PRN

## 2017-11-04 MED ORDER — NAPROXEN 500 MG PO TABS: 500.0000 mg | ORAL_TABLET | Freq: Two times a day (BID) | ORAL | Status: DC | PRN

## 2017-11-04 MED ORDER — METHOCARBAMOL 500 MG PO TABS: 500.0000 mg | ORAL_TABLET | Freq: Three times a day (TID) | ORAL | Status: DC | PRN

## 2017-11-04 MED ORDER — DICYCLOMINE HCL 20 MG PO TABS: 20.0000 mg | ORAL_TABLET | Freq: Four times a day (QID) | ORAL | Status: DC | PRN

## 2017-11-04 MED ORDER — MIRTAZAPINE 7.5 MG PO TABS
7.5000 mg | ORAL_TABLET | Freq: Every day | ORAL | Status: DC
  Filled 2017-11-04: qty 1

## 2017-11-04 MED ORDER — OLANZAPINE 5 MG PO TABS: 5.0000 mg | ORAL_TABLET | Freq: Every day | ORAL | Status: DC

## 2017-11-04 MED ORDER — VENLAFAXINE HCL ER 75 MG PO CP24
75.0000 mg | ORAL_CAPSULE | Freq: Every day | ORAL | Status: DC
  Administered 2017-11-04: 75 mg via ORAL
  Filled 2017-11-04: qty 1

## 2017-11-04 MED ORDER — LOPERAMIDE HCL 2 MG PO CAPS: 2.0000 mg | ORAL_CAPSULE | ORAL | Status: DC | PRN

## 2017-11-04 MED ORDER — MAGNESIUM HYDROXIDE 400 MG/5ML PO SUSP: 30.0000 mL | Freq: Every day | ORAL | Status: DC | PRN

## 2017-11-04 MED ORDER — OLANZAPINE 5 MG PO TABS
5.0000 mg | ORAL_TABLET | Freq: Every day | ORAL | Status: DC
  Administered 2017-11-04 – 2017-11-07 (×4): 5 mg via ORAL
  Filled 2017-11-04: qty 7
  Filled 2017-11-04 (×6): qty 1

## 2017-11-04 MED ORDER — ACETAMINOPHEN 325 MG PO TABS: 650.0000 mg | ORAL_TABLET | Freq: Four times a day (QID) | ORAL | Status: DC | PRN

## 2017-11-04 MED ORDER — DICYCLOMINE HCL 20 MG PO TABS
20.0000 mg | ORAL_TABLET | Freq: Four times a day (QID) | ORAL | Status: DC | PRN
  Administered 2017-11-04: 20 mg via ORAL
  Filled 2017-11-04: qty 1

## 2017-11-04 MED ORDER — METHOCARBAMOL 500 MG PO TABS
500.0000 mg | ORAL_TABLET | Freq: Three times a day (TID) | ORAL | Status: DC | PRN
  Administered 2017-11-04 – 2017-11-07 (×4): 500 mg via ORAL
  Filled 2017-11-04 (×4): qty 1

## 2017-11-04 MED ORDER — LORATADINE 10 MG PO TABS
10.0000 mg | ORAL_TABLET | Freq: Every day | ORAL | Status: DC
  Administered 2017-11-04: 10 mg via ORAL
  Filled 2017-11-04: qty 1

## 2017-11-04 MED ORDER — HYDROXYZINE HCL 25 MG PO TABS
25.0000 mg | ORAL_TABLET | Freq: Four times a day (QID) | ORAL | Status: DC | PRN
  Administered 2017-11-04 – 2017-11-07 (×4): 25 mg via ORAL
  Filled 2017-11-04 (×4): qty 1
  Filled 2017-11-04: qty 10

## 2017-11-04 MED ORDER — GABAPENTIN 100 MG PO CAPS
100.0000 mg | ORAL_CAPSULE | Freq: Three times a day (TID) | ORAL | Status: DC
  Administered 2017-11-04 (×2): 100 mg via ORAL
  Filled 2017-11-04 (×2): qty 1

## 2017-11-04 MED ORDER — ADULT MULTIVITAMIN W/MINERALS CH
1.0000 | ORAL_TABLET | Freq: Every day | ORAL | Status: DC
  Administered 2017-11-05 – 2017-11-08 (×4): 1 via ORAL
  Filled 2017-11-04 (×6): qty 1

## 2017-11-04 MED ORDER — PANTOPRAZOLE SODIUM 20 MG PO TBEC
20.0000 mg | DELAYED_RELEASE_TABLET | Freq: Every day | ORAL | Status: DC
  Administered 2017-11-04: 20 mg via ORAL
  Filled 2017-11-04: qty 1

## 2017-11-04 MED ORDER — POTASSIUM CHLORIDE CRYS ER 20 MEQ PO TBCR
40.0000 meq | EXTENDED_RELEASE_TABLET | Freq: Once | ORAL | Status: AC
  Administered 2017-11-04: 40 meq via ORAL
  Filled 2017-11-04: qty 2

## 2017-11-04 MED ORDER — PANTOPRAZOLE SODIUM 20 MG PO TBEC
20.0000 mg | DELAYED_RELEASE_TABLET | Freq: Every day | ORAL | Status: DC
  Administered 2017-11-05 – 2017-11-08 (×4): 20 mg via ORAL
  Filled 2017-11-04 (×6): qty 1

## 2017-11-04 MED ORDER — ADULT MULTIVITAMIN W/MINERALS CH
1.0000 | ORAL_TABLET | Freq: Every day | ORAL | Status: DC
  Administered 2017-11-04: 1 via ORAL
  Filled 2017-11-04: qty 1
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
## 2017-11-04 NOTE — ED Notes (Signed)
Patient resting in bed watching television with no signs of distress noted. Patient denies any needs at this time. Pt maintains a flat affect but is pleasant in her interactions with staff. Pt verbally contracts for safety.

## 2017-11-04 NOTE — ED Notes (Signed)
Bed: WA26 Expected date:  Expected time:  Means of arrival:  Comments: 

## 2017-11-04 NOTE — ED Triage Notes (Signed)
Pt states she was here last with due to overdose, feels as if she is still withdrawing, yesterday stared to feel suicidal with plan to overdose with pills and heroin. Pt calm at present time.

## 2017-11-04 NOTE — Progress Notes (Signed)
Admission note:  Pt is a 42 year old Caucasian female admitted to the services of Dr. Jola Babinski for substance abuse of benzodiazepines (Xanax 2 mg bars daily totally 20-30 a week), as well as for suicidal ideation.  Pt states that she has had difficulty in keeping to her treatment plan in the past due to transportation issues.  Pt states she is feeling overwhelmed.  Pt denies suicidal ideation at this time but has had three past attempts and was last suicidal to overdose.  Pt did overdose on opiates May 11 and was resuscitated with narcan at that time.  Pt has been inpatient here as well as [facility].  Pt is cooperative with the admission process.  Pt shown to room 304, night medications given as ordered.

## 2017-11-04 NOTE — Tx Team (Signed)
Initial Treatment Plan 11/04/2017 11:17 PM Charlotte Strong [id number]    PATIENT STRESSORS: Financial difficulties Marital or family conflict Medication change or noncompliance Substance abuse   PATIENT STRENGTHS: Average or above average intelligence Communication skills Motivation for treatment/growth Physical Health   PATIENT IDENTIFIED PROBLEMS: Depression  Suicidal Ideation  Substance abuse  "Talk to social worker about how to keep to follow up plan for treatment, transportation to appointments, medications"  "stick to treatment plan"             DISCHARGE CRITERIA:  Adequate post-discharge living arrangements Improved stabilization in mood, thinking, and/or behavior Motivation to continue treatment in a less acute level of care Need for constant or close observation no longer present Withdrawal symptoms are absent or subacute and managed without 24-hour nursing intervention  PRELIMINARY DISCHARGE PLAN: Attend 12-step recovery group Outpatient therapy Placement in alternative living arrangements  PATIENT/FAMILY INVOLVEMENT: This treatment plan has been presented to and reviewed with the patient, Charlotte Strong.  The patient and family have been given the opportunity to ask questions and make suggestions.  Charlotte Pares, RN 11/04/2017, 11:17 PM

## 2017-11-04 NOTE — BHH Counselor (Signed)
Support paperwork signed and faxed to [facility]. [employer] placed in pt's chart. Patient accepted to bed 304-1. Night AC will call when bed ready and pt can be transported.   Evette Cristal, [place] Therapeutic Triage Specialist

## 2017-11-04 NOTE — ED Notes (Addendum)
Pt to go to [facility] Room 304 Bed 1 at 2030 today

## 2017-11-04 NOTE — ED Notes (Signed)
Pt given sandwich and juice. Resting without complaints at present time
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
## 2017-11-04 NOTE — ED Notes (Signed)
Pt moved to room 34 Edie RN given reoprt

## 2017-11-04 NOTE — Patient Outreach (Signed)
ED Peer Support Specialist Patient Intake (Complete at intake & 30-60 Day Follow-up)  Name: Charlotte Strong  MRN: [id number]  Age: 42 y.o.   Date of Admission: 11/04/2017  Intake: Initial Comments:      Primary Reason Admitted: SI, depression, poly substance use with benzos, cannabinoids, and heroin  Lab values: Alcohol/ETOH: Negative Positive UDS? Yes Amphetamines: Yes Barbiturates: No Benzodiazepines: Yes Cocaine: No Opiates: No Cannabinoids: Yes  Demographic information: Gender: Female Ethnicity: White Marital Status: Divorced Insurance Status: Uninsured/Self-pay [employer] (Work Neurosurgeon, Physicist, medical, etc.: No Lives with: Friend/Rommate Living situation: House/Apartment  Reported Patient History: Patient reported health conditions: Depression, Asthma Patient aware of HIV and hepatitis status: Yes (comment)(Hepatitis C)  In past year, has patient visited ED for any reason? Yes  Number of ED visits: 4  Reason(s) for visit: poly substance use, SI, drug overdose on heroin 10/20/17  In past year, has patient been hospitalized for any reason? Yes  Number of hospitalizations: 3  Reason(s) for hospitalization: poly substance use, SI [facility] admissions on 03/15/18,09/13/17, and 10/13/17  In past year, has patient been arrested? No  Number of arrests:    Reason(s) for arrest:    In past year, has patient been incarcerated? No  Number of incarcerations:    Reason(s) for incarceration:    In past year, has patient received medication-assisted treatment? No  In past year, patient received the following treatments:    In past year, has patient received any harm reduction services? No  Did this include any of the following?    In past year, has patient received care from a mental health provider for diagnosis other than SUD? No  In past year, is this first time patient has overdosed? No(May 11th for heroin overdose)  Number of past  overdoses: 1  In past year, is this first time patient has been hospitalized for an overdose? No  Number of hospitalizations for overdose(s): 1(May 11th overdose on heroin)  Is patient currently receiving treatment for a mental health diagnosis? No  Patient reports experiencing difficulty participating in SUD treatment: No    Most important reason(s) for this difficulty?    Has patient received prior services for treatment? Yes  In past, patient has received services from following agencies:    Plan of Care:  Suggested follow up at these agencies/treatment centers: (Patient is going to [facility] at 8:30 pm. CPSS will follow up with the patient at [facility] for substance use recovery support and help with recovery resources. )  Other information: CPSS met with the patient and provided substance use recovery support. CPSS utilized motivational interviewing skills to highlight patient strengths with her courage to come ask for help for her substance use addiction. Patient is interested in further help with substance use recovery resources at [facility] from [place]. CPSS provided CPSS contact information. CPSS highly encouraged the patient to contact CPSS for substance use recovery support while at [facility].    Mason Jim, CPSS  11/04/2017 3:51 PM

## 2017-11-05 DIAGNOSIS — F17210 Nicotine dependence, cigarettes, uncomplicated: Secondary | ICD-10-CM

## 2017-11-05 DIAGNOSIS — Z811 Family history of alcohol abuse and dependence: Secondary | ICD-10-CM

## 2017-11-05 DIAGNOSIS — R45851 Suicidal ideations: Secondary | ICD-10-CM

## 2017-11-05 DIAGNOSIS — Z818 Family history of other mental and behavioral disorders: Secondary | ICD-10-CM

## 2017-11-05 DIAGNOSIS — G4700 Insomnia, unspecified: Secondary | ICD-10-CM

## 2017-11-05 DIAGNOSIS — F332 Major depressive disorder, recurrent severe without psychotic features: Principal | ICD-10-CM

## 2017-11-05 DIAGNOSIS — F1994 Other psychoactive substance use, unspecified with psychoactive substance-induced mood disorder: Secondary | ICD-10-CM

## 2017-11-05 DIAGNOSIS — F419 Anxiety disorder, unspecified: Secondary | ICD-10-CM

## 2017-11-05 DIAGNOSIS — Z62810 Personal history of physical and sexual abuse in childhood: Secondary | ICD-10-CM

## 2017-11-05 DIAGNOSIS — F4310 Post-traumatic stress disorder, unspecified: Secondary | ICD-10-CM

## 2017-11-05 DIAGNOSIS — F1120 Opioid dependence, uncomplicated: Secondary | ICD-10-CM
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
## 2017-11-05 NOTE — [facility] Suicide Risk Assessment (Signed)
[facility] INPATIENT:  Family/Significant Other Suicide Prevention Education  Suicide Prevention Education:  Patient Refusal for Family/Significant Other Suicide Prevention Education: The patient Charlotte Strong has refused to provide written consent for family/significant other to be provided Family/Significant Other Suicide Prevention Education during admission and/or prior to discharge.  Physician notified.  Baldo Daub [facility] 11/05/2017, 2:04 PM
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
## 2017-11-05 NOTE — Progress Notes (Signed)
Nursing Progress Note: 7-7p  D- Pt is vague about how many pills she took. " I only used once but I was out of it .". Affect is blunted and appropriate. Pt is able to contract for safety. Reports an increase in depression  A - Observed pt interacting in group and in the milieu.Support and encouragement offered, safety maintained with q 15 minutes. Marland Kitchen  R-Contracts for safety and continues to follow treatment plan, working on learning new coping skills to prevent relapse
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
## 2017-11-06 DIAGNOSIS — K219 Gastro-esophageal reflux disease without esophagitis: Secondary | ICD-10-CM

## 2017-11-06 DIAGNOSIS — J45909 Unspecified asthma, uncomplicated: Secondary | ICD-10-CM
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
## 2017-11-06 NOTE — [facility] Group Notes (Signed)
[facility] Group Therapy 11/06/2017 1:15pm  Type of Therapy: [facility] Presentation  Participation Level: invited. Chose to remain in bed.    [employer], LCSW 11/06/2017 2:40 PM

## 2017-11-06 NOTE — Plan of Care (Signed)
Problem: Health Behavior/Discharge Planning: Goal: Compliance with treatment plan for underlying cause of condition will improve Intervention: Patient encouraged to take medications as prescribed and attend groups. Patient encouraged to be active in their recovery. Outcome: Patient is attending groups, taking medications as prescribed and complying with goals of treatment. 11/06/2017 1:16 PM - Progressing by Ferrel Logan, RN   Problem: Safety: Goal: Periods of time without injury will increase Intervention: Patient verbally agrees not to harm self. Low fall risk precautions in place. Safety monitored with q15 minute checks. Outcome: Patient remains safe on the unit at this time. 11/06/2017 1:16 PM - Progressing by Ferrel Logan, RN

## 2017-11-06 NOTE — Progress Notes (Signed)
D   Pt is pleasant on approach and cooperative    She attended AA group this evening and her behavior is appropriate   She endorses some depression and anxiety  A   Verbal support given   Medications administered and effectiveness monitored   Q 15 min checks  R   Pt is safe at present

## 2017-11-06 NOTE — Progress Notes (Signed)
Patient ID: Charlotte Strong, female   DOB: 11/22/1975, 42 y.o.   MRN: [id number]  Nursing Progress Note [id number]  Data: Patient presents calm and cooperative. Patient complaint with scheduled medications. Patient denies pain/physical complaints. Patient completed self-inventory sheet and rates depression, hopelessness, and anxiety 6,6,7 respectively. Patient rates their sleep and appetite as good/fair respectively. Patient states goal for today is to "work on staying up". Patient is seen attending groups and visible in the milieu. Patient currently denies HI/AVH but reports passive SI with no plan.   Action: Patient educated about and provided medication per provider's orders. Patient safety maintained with q15 min safety checks and frequent rounding. Low fall risk precautions in place. Emotional support given. 1:1 interaction and active listening provided. Patient encouraged to attend meals and groups. Patient encouraged to work on treatment plan and goals. Labs, vital signs and patient behavior monitored throughout shift.   Response: Patient currently denies any plan or intention to act on SI thoughts. Patient agrees to come to staff if she develops intention of acting on thoughts or if they worsen. Patient remains safe on the unit at this time. Patient is interacting with peers appropriately on the unit. Will continue to support and monitor.

## 2017-11-07 MED ORDER — NICOTINE POLACRILEX 2 MG MT GUM
2.0000 mg | CHEWING_GUM | OROMUCOSAL | Status: DC | PRN
  Administered 2017-11-07 – 2017-11-08 (×3): 2 mg via ORAL
  Filled 2017-11-07: qty 1
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
## 2017-11-07 NOTE — Progress Notes (Addendum)
Recreation Therapy Notes  Date: 5.29.19 Time: 0930 Location: [address] Dayroom  Group Topic: Stress Management  Goal Area(s) Addresses:  Patient will verbalize importance of using healthy stress management.  Patient will identify positive emotions associated with healthy stress management.   Behavioral Response: Engaged  Intervention: Stress Management  Activity :  Meditation.  LRT played Strong meditation focusing on letting go of the past and living in the now.  Patients listened and followed along as the meditation played.  Education:  Stress Management, Discharge Planning.   Education Outcome: Acknowledges edcuation/In group clarification offered/Needs additional education  Clinical Observations/Feedback: Pt attended and participated in group.     Charlotte Strong, LRT/CTRS         Charlotte Strong 11/07/2017 11:49 AM 

## 2017-11-07 NOTE — Therapy (Signed)
Occupational Therapy Group Treatment Note  Date:  11/07/2017 Time:  12:23 PM  Group Topic/Focus:  Stress Management  Participation Level:  Minimal  Participation Quality:  Inattentive  Affect:  Flat  Cognitive:  Appropriate  Insight: Improving  Engagement in Group:  Limited  Modes of Intervention:  Activity and Education  Additional Comments:    S: "My poor stress management led me here"  O: Stress management group completed to use as productive coping strategy, to help mitigate maladaptive coping to integrate in functional BADL/IADL. Education given on the definition of stress and its cognitive, behavioral, emotional, and physical effects on the body. Stress management tools worksheet completed to identify negative coping mechanisms and their short and long term effects vs positive coping mechanisms with demonstration. Coping strategies taught include: relaxation based- deep breathing, counting to 10, taking a 1 minute vacation, acceptance, stress balls, relaxation audio/video, visual/mental imagery. Positive mental attitude- gratitude, acceptance, cognitive reframing, positive self talk, anger management. Self control circle activity completed to identify areas of control and areas not within personal control to facilitate acceptance in daily relationships and life. Adult coloring pages given at end of session.   A: Pt minimally engaged in group this date, leaving x2 and coming back. Pt once whispering with other group member, but little productive interaction with peers and facilitator. Education given on stress management tools worksheet, pt not offering any insight of nonproductive or desired productive coping mechanisms. Pt with history of drug/alcohol dependence. Education given on self control sircle activity, pt not offering any personal examples but occasionally nodding along with other group members.   P: Pt provided with education on stress management activities to implement  into daily routine. Handouts given to facilitate carryover when reintegrating into community   [facility], [place], OTR/L  [employer] 11/07/2017, 12:23 PM

## 2017-11-07 NOTE — [facility] Group Notes (Signed)
LCSW Group Therapy Note 11/07/2017 2:30 PM  Type of Therapy and Topic: Group Therapy: Overcoming Obstacles  Participation Level: Did Not Attend  Description of Group:  In this group patients will be encouraged to explore what they see as obstacles to their own wellness and recovery. They will be guided to discuss their thoughts, feelings, and behaviors related to these obstacles. The group will process together ways to cope with barriers, with attention given to specific choices patients can make. Each patient will be challenged to identify changes they are motivated to make in order to overcome their obstacles. This group will be process-oriented, with patients participating in exploration of their own experiences as well as giving and receiving support and challenge from other group members.  Therapeutic Goals: 1. Patient will identify personal and current obstacles as they relate to admission. 2. Patient will identify barriers that currently interfere with their wellness or overcoming obstacles.  3. Patient will identify feelings, thought process and behaviors related to these barriers. 4. Patient will identify two changes they are willing to make to overcome these obstacles:   Summary of Patient Progress  Invited, chose not to attend.     Therapeutic Modalities:  Cognitive Behavioral Therapy Solution Focused Therapy Motivational Interviewing Relapse Prevention Therapy   Alcario Drought Clinical Social Worker

## 2017-11-07 NOTE — Plan of Care (Signed)
Patient was sleeping upon initial approach. Patient denies SI/HI/AVH. Contracts for safety. Patient said she slept okay last night and is ready to go home. Patient compliant with all mediations prescribed per MD. No questions or concerns. Safety maintained with 15 minute checks. Will continue to monitor.  Problem: Activity: Goal: Interest or engagement in activities will improve Outcome: Progressing  Patient attended morning group and said it was helpful.

## 2017-11-07 NOTE — Progress Notes (Signed)
Patient ID: Charlotte Strong, female   DOB: 1976-06-02, 42 y.o.   MRN: [id number] D: Pt observed in the dayroom interacting with peers. Pt at the time of assessment endorsed moderate generalized pain-see MAR. Pt denied anxiety, depression, SI/HI or AVH, states, "I feel better; I feel good." A: Medications offered as prescribed. Pt given the opportunity to ask questions and state concerns. Support, encouragement, and safe environment provided. R: Pt was med compliant. All patient's questions and concerns addressed. 15-minute safety checks continue. Safety checks continue. Pt did attend wrap-up group.

## 2017-11-07 NOTE — Tx Team (Signed)
Interdisciplinary Treatment and Diagnostic Plan Update  11/07/2017 Time of Session: [id number] Charlotte Strong MRN: [id number]  Principal Diagnosis: Substance induced mood disorder (HCC)  Secondary Diagnoses: Principal Problem:   Substance induced mood disorder (HCC) Active Problems:   Opioid use disorder, severe, dependence (HCC)   MDD (major depressive disorder), recurrent severe, without psychosis (HCC)   Current Medications:  Current Facility-Administered Medications  Medication Dose Route Frequency Provider Last Rate Last Dose  . acetaminophen (TYLENOL) tablet 650 mg  650 mg Oral Q6H PRN Laveda Abbe, NP      . albuterol (PROVENTIL HFA;VENTOLIN HFA) 108 (90 Base) MCG/ACT inhaler 1-2 puff  1-2 puff Inhalation Q6H PRN Laveda Abbe, NP      . alum & mag hydroxide-simeth (MAALOX/MYLANTA) 200-200-20 MG/5ML suspension 30 mL  30 mL Oral Q4H PRN Laveda Abbe, NP      . dicyclomine (BENTYL) tablet 20 mg  20 mg Oral Q6H PRN Laveda Abbe, NP      . gabapentin (NEURONTIN) capsule 100 mg  100 mg Oral TID Laveda Abbe, NP   100 mg at 11/07/17 4696  . hydrOXYzine (ATARAX/VISTARIL) tablet 25 mg  25 mg Oral Q6H PRN Laveda Abbe, NP   25 mg at 11/06/17 2220  . loperamide (IMODIUM) capsule 2-4 mg  2-4 mg Oral PRN Laveda Abbe, NP      . magnesium hydroxide (MILK OF MAGNESIA) suspension 30 mL  30 mL Oral Daily PRN Laveda Abbe, NP      . methocarbamol (ROBAXIN) tablet 500 mg  500 mg Oral Q8H PRN Laveda Abbe, NP   500 mg at 11/06/17 2220  . mirtazapine (REMERON) tablet 7.5 mg  7.5 mg Oral QHS Laveda Abbe, NP   7.5 mg at 11/06/17 2220  . multivitamin with minerals tablet 1 tablet  1 tablet Oral Daily Laveda Abbe, NP   1 tablet at 11/07/17 2952  . naproxen (NAPROSYN) tablet 500 mg  500 mg Oral BID PRN Laveda Abbe, NP      . OLANZapine [facility]) tablet 5 mg  5 mg Oral QHS Laveda Abbe, NP   5  mg at 11/06/17 2220  . ondansetron (ZOFRAN-ODT) disintegrating tablet 4 mg  4 mg Oral Q6H PRN Laveda Abbe, NP      . pantoprazole (PROTONIX) EC tablet 20 mg  20 mg Oral Daily Laveda Abbe, NP   20 mg at 11/07/17 8413  . venlafaxine XR (EFFEXOR-XR) 24 hr capsule 75 mg  75 mg Oral Daily Laveda Abbe, NP   75 mg at 11/07/17 2440   PTA Medications: Medications Prior to Admission  Medication Sig Dispense Refill Last Dose  . albuterol (PROVENTIL HFA;VENTOLIN HFA) 108 (90 Base) MCG/ACT inhaler Inhale 1-2 puffs into the lungs every 6 (six) hours as needed for wheezing or shortness of breath.   unk  . gabapentin (NEURONTIN) 100 MG capsule Take 1 capsule (100 mg total) by mouth 3 (three) times daily. For agitation 90 capsule 0 Past Week at Unknown time  . loratadine (CLARITIN) 10 MG tablet Take 1 tablet (10 mg total) by mouth daily. (May buy from over the counter): For allergies   Past Week at Unknown time  . mirtazapine (REMERON) 7.5 MG tablet Take 1 tablet (7.5 mg total) by mouth at bedtime. 30 tablet 0 Past Week at Unknown time  . Multiple Vitamin (MULTIVITAMIN WITH MINERALS) TABS tablet Take 1 tablet by mouth daily. 30 tablet 0 Past  Week at Unknown time  . OLANZapine (ZYPREXA) 5 MG tablet Take 1 tablet (5 mg total) by mouth at bedtime. 30 tablet 0 Past Week at Unknown time  . pantoprazole (PROTONIX) 20 MG tablet Take 1 tablet (20 mg total) by mouth daily. 30 tablet 0 Past Week at Unknown time  . venlafaxine XR (EFFEXOR-XR) 75 MG 24 hr capsule Take 1 capsule (75 mg total) by mouth daily. 30 capsule 0 Past Week at Unknown time    Patient Stressors: Financial difficulties Marital or family conflict Medication change or noncompliance Substance abuse  Patient Strengths: Average or above average intelligence Communication skills Motivation for treatment/growth Physical Health  Treatment Modalities: Medication Management, Group therapy, Case management,  1 to 1 session  with clinician, Psychoeducation, Recreational therapy.   Physician Treatment Plan for Primary Diagnosis: Substance induced mood disorder (HCC) Long Term Goal(s): Improvement in symptoms so as ready for discharge Improvement in symptoms so as ready for discharge   Short Term Goals: Ability to identify changes in lifestyle to reduce recurrence of condition will improve Ability to demonstrate self-control will improve Ability to identify and develop effective coping behaviors will improve Compliance with prescribed medications will improve Ability to identify triggers associated with substance abuse/mental health issues will improve  Medication Management: Evaluate patient's response, side effects, and tolerance of medication regimen.  Therapeutic Interventions: 1 to 1 sessions, Unit Group sessions and Medication administration.  Evaluation of Outcomes: Progressing  Physician Treatment Plan for Secondary Diagnosis: Principal Problem:   Substance induced mood disorder (HCC) Active Problems:   Opioid use disorder, severe, dependence (HCC)   MDD (major depressive disorder), recurrent severe, without psychosis (HCC)  Long Term Goal(s): Improvement in symptoms so as ready for discharge Improvement in symptoms so as ready for discharge   Short Term Goals: Ability to identify changes in lifestyle to reduce recurrence of condition will improve Ability to demonstrate self-control will improve Ability to identify and develop effective coping behaviors will improve Compliance with prescribed medications will improve Ability to identify triggers associated with substance abuse/mental health issues will improve     Medication Management: Evaluate patient's response, side effects, and tolerance of medication regimen.  Therapeutic Interventions: 1 to 1 sessions, Unit Group sessions and Medication administration.  Evaluation of Outcomes: Progressing   RN Treatment Plan for Primary Diagnosis:  Substance induced mood disorder (HCC) Long Term Goal(s): Knowledge of disease and therapeutic regimen to maintain health will improve  Short Term Goals: Ability to remain free from injury will improve, Ability to disclose and discuss suicidal ideas and Ability to identify and develop effective coping behaviors will improve  Medication Management: RN will administer medications as ordered by provider, will assess and evaluate patient's response and provide education to patient for prescribed medication. RN will report any adverse and/or side effects to prescribing provider.  Therapeutic Interventions: 1 on 1 counseling sessions, Psychoeducation, Medication administration, Evaluate responses to treatment, Monitor vital signs and CBGs as ordered, Perform/monitor CIWA, COWS, AIMS and Fall Risk screenings as ordered, Perform wound care treatments as ordered.  Evaluation of Outcomes: Progressing   LCSW Treatment Plan for Primary Diagnosis: Substance induced mood disorder (HCC) Long Term Goal(s): Safe transition to appropriate next level of care at discharge, Engage patient in therapeutic group addressing interpersonal concerns.  Short Term Goals: Engage patient in aftercare planning with referrals and resources, Facilitate patient progression through stages of change regarding substance use diagnoses and concerns and Identify triggers associated with mental health/substance abuse issues  Therapeutic Interventions: Assess  for all discharge needs, 1 to 1 time with Child psychotherapist, Explore available resources and support systems, Assess for adequacy in community support network, Educate family and significant other(s) on suicide prevention, Complete Psychosocial Assessment, Interpersonal group therapy.  Evaluation of Outcomes: Progressing   Progress in Treatment: Attending groups: Yes. Participating in groups: Yes. Taking medication as prescribed: Yes. Toleration medication: Yes. Family/Significant  other contact made: SPE completed with pt; pt declined to consent to collateral contact.  Patient understands diagnosis: Yes. Discussing patient identified problems/goals with staff: Yes. Medical problems stabilized or resolved: Yes. Denies suicidal/homicidal ideation: Yes. Issues/concerns per patient self-inventory: No. Other: n/a   New problem(s) identified: No, Describe:  n/a  New Short Term/Long Term Goal(s): detox, medication management for mood stabilization; elimination of SI thoughts; development of comprehensive mental wellness/sobriety plan.   Patient Goals: "To stick with my aftercare plan and go to treatment."   Discharge Plan or Barriers: CSW assessing--pt on [facility] and referred to [facility]. No beds currently available. [facility] appt made. [facility] pamphlet, Mobile Crisis information, and AA/NA information provided to patient for additional community support and resources.   Reason for Continuation of Hospitalization: Depression Medication stabilization Withdrawal symptoms  Estimated Length of Stay: Thursday, 11/08/17  Attendees: Patient: Charlotte Strong 11/07/2017 8:50 AM  Physician: Dr. Jama Flavors MD; Dr. Altamese Granite MD 11/07/2017 8:50 AM  Nursing: Stormy Fabian RN; Roni RN 11/07/2017 8:50 AM  RN Care Manager:x 11/07/2017 8:50 AM  Social Worker: Chartered loss adjuster, LCSW 11/07/2017 8:50 AM  Recreational Therapist: x 11/07/2017 8:50 AM  Other: Armandina Stammer NP 11/07/2017 8:50 AM  Other:  11/07/2017 8:50 AM  Other: 11/07/2017 8:50 AM    Scribe for Treatment Team: Ledell Peoples Smart, LCSW 11/07/2017 8:50 AM

## 2017-11-07 NOTE — Progress Notes (Signed)
Pt reports she has had a good day.  She presents in a bright mood and looks relaxed.  She denies SI/HI/AVH.  She denies having any withdrawal symptoms at this time.  She has been going to groups and is med compliant on the unit.  Pt makes her needs known in an appropriate manner.  She states she is looking forward to discharge, possibly tomorrow to a treatment program.  Support and encouragement offered.  Discharge plans are in process.  Safety maintained with q15 minute checks.

## 2017-11-08 MED ORDER — MIRTAZAPINE 7.5 MG PO TABS: 7.5000 mg | ORAL_TABLET | Freq: Every day | ORAL | 0 refills | Status: DC

## 2017-11-08 MED ORDER — VENLAFAXINE HCL ER 75 MG PO CP24: 75.0000 mg | ORAL_CAPSULE | Freq: Every day | ORAL | 0 refills | Status: DC

## 2017-11-08 MED ORDER — NICOTINE POLACRILEX 2 MG MT GUM: 2.0000 mg | CHEWING_GUM | OROMUCOSAL | 0 refills | Status: DC | PRN

## 2017-11-08 MED ORDER — ALBUTEROL SULFATE HFA 108 (90 BASE) MCG/ACT IN AERS: 1.0000 | INHALATION_SPRAY | Freq: Four times a day (QID) | RESPIRATORY_TRACT | Status: DC | PRN

## 2017-11-08 MED ORDER — GABAPENTIN 100 MG PO CAPS: 100.0000 mg | ORAL_CAPSULE | Freq: Three times a day (TID) | ORAL | 0 refills | Status: DC

## 2017-11-08 MED ORDER — HYDROXYZINE HCL 25 MG PO TABS: 25.0000 mg | ORAL_TABLET | Freq: Four times a day (QID) | ORAL | 0 refills | Status: DC | PRN

## 2017-11-08 MED ORDER — OLANZAPINE 5 MG PO TABS: 5.0000 mg | ORAL_TABLET | Freq: Every day | ORAL | 0 refills | Status: DC

## 2017-11-08 NOTE — Progress Notes (Signed)
  [facility] Adult Case Management Discharge Plan :  Will you be returning to the same living situation after discharge:  Yes,  patient reports she is returning home with her roommate At discharge, do you have transportation home?: Yes,  bus pass Do you have the ability to pay for your medications: No.  Release of information consent forms completed and in the chart;  Patient's signature needed at discharge.  Patient to Follow up at: Follow-up Information    [facility] Follow up on 11/14/2017.   Specialty:  Behavioral Health Why:  Hospital follow-up on Wed, 6/5 at 8:15AM. Please bring: photo ID, social security card, any proof of income if you have it, and medication list. Thank you. Please walk in Friday or Monday so that yoou can receive 2 weeks worth of medications for [facility]. Contact information: [address] [place] [address] [phone]        Services, [facility] Follow up on 11/13/2017.   Why:  Screening for possible admission on Tuesday, 6/4 at 8:00AM. Please bring Photo ID/proof of [employer], clothing (no leggings), and medications/prescriptions provided by hospital. Thank you.  Contact information: [address] [place] [address] [phone]           Next level of care provider has access to [facility] Link:yes  Safety Planning and Suicide Prevention discussed: Yes,  with the patient  Have you used any form of tobacco in the last 30 days? (Cigarettes, Smokeless Tobacco, Cigars, and/or Pipes): Yes  Has patient been referred to the Quitline?: Patient refused referral  Patient has been referred for addiction treatment: Yes  Maeola Sarah, LCSWA 11/08/2017, 11:07 AM

## 2017-11-08 NOTE — Progress Notes (Signed)
All discharge paperwork reviewed with patient. Prescriptions and samples given to patient and questions were answered. Denied SI/HI/AVH.  All belongings from locker returned to patient. Patient was walked to lobby with a bus pass. Patient thanked RN and staff for everything.  
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
## 2017-11-08 NOTE — [facility] Suicide Risk Assessment (Signed)
[facility] Discharge Suicide Risk Assessment   Principal Problem: Substance induced mood disorder [facility]) Discharge Diagnoses:  Patient Active Problem List   Diagnosis Date Noted  . MDD (major depressive disorder), recurrent severe, without psychosis (HCC) [F33.2] 11/04/2017  . Polysubstance dependence (HCC) [F19.20]   . Chronic hepatitis C without hepatic coma (HCC) [B18.2]   . MDD (major depressive disorder), recurrent episode, severe (HCC) [F33.2] 10/12/2017  . GERD (gastroesophageal reflux disease) [K21.9] 09/15/2017  . Major depressive disorder, single episode, severe without psychosis (HCC) [F32.2] 09/13/2017  . 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 Opioid use disorder, severe, dependence (HCC) [F11.20] 09/24/2015  . 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 Substance abuse (HCC) [F19.10]   . Suicidal ideation [R45.851]   . Left foot drop [M21.372] 12/01/2014  . Left wrist drop [M21.332] 12/01/2014  . Substance induced mood disorder (HCC) [F19.94] 10/13/2014  . 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 Compartment syndrome of lower extremity (HCC) [T79.A29A] 10/08/2014  . Wheezing [R06.2]   . Neuropathic pain [M79.2]   . Depression with anxiety [F41.8]   . Palliative care encounter [Z51.5]   . Pain [R52]   . Encephalopathy acute [G93.40]   . Aspiration pneumonia (HCC) [J69.0]   . Altered mental status [R41.82] 09/30/2014  . Acute renal failure (HCC) [N17.9] 09/30/2014  . Rhabdomyolysis [M62.82] 09/30/2014  . Acute respiratory failure with hypoxia (HCC) [J96.01] 09/30/2014  . Lactic acidosis [E87.2] 09/30/2014    Total Time spent with patient: 30 minutes  Musculoskeletal: Strength & Muscle Tone: within normal limits Gait & Station: normal Patient leans: N/A  Psychiatric Specialty Exam: Review of Systems  Constitutional: Negative for  chills and fever.  Respiratory: Negative for cough and shortness of breath.   Cardiovascular: Negative for chest pain.  Gastrointestinal: Negative for abdominal pain, heartburn, nausea and vomiting.  Psychiatric/Behavioral: Negative for depression, hallucinations and suicidal ideas. The patient is not nervous/anxious and does not have insomnia.     Blood pressure (!) 121/108, pulse 88, temperature 98.3 F (36.8 C), temperature source Oral, resp. rate 16, height  (1.676 m), weight 54.9 kg (121 lb), SpO2 99 %.Body mass index is 19.53 kg/m.  General Appearance: Casual and Fairly Groomed  Patent attorney::  Good  Speech:  Clear and Coherent and Normal Rate  Volume:  Normal  Mood:  Euthymic  Affect:  Appropriate and Congruent  Thought Process:  Coherent and Goal Directed  Orientation:  Full (Time, Place, and Person)  Thought Content:  Logical  Suicidal Thoughts:  No  Homicidal Thoughts:  No  Memory:  Immediate;   Fair Recent;   Fair Remote;   Fair  Judgement:  Fair  Insight:  Lacking  Psychomotor Activity:  Normal  Concentration:  Fair  Recall:  [employer] of Knowledge:Fair  Language: Fair  Akathisia:  No  Handed:    AIMS (if indicated):     Assets:  Resilience  Sleep:  Number of Hours: 6.75  Cognition: WNL  ADL's:  Intact   Mental Status Per Nursing Assessment::   On Admission:  Suicidal ideation indicated by others, Suicide plan  Demographic Factors:  Low socioeconomic status and Unemployed  Loss Factors: Financial problems/change in socioeconomic status  Historical Factors: Family history of mental illness or substance abuse and Impulsivity  Risk Reduction Factors:   Positive social support, Positive therapeutic relationship and Positive coping skills or problem solving skills  Continued Clinical  Symptoms:  Depression:   Comorbid alcohol abuse/dependence Impulsivity Severe Alcohol/Substance Abuse/Dependencies  Cognitive Features That Contribute To Risk:   None    Suicide Risk:  Minimal: No identifiable suicidal ideation.  Patients presenting with no risk factors but with morbid ruminations; may be classified as minimal risk based on the severity of the depressive symptoms  Follow-up Information    [facility] Follow up.   Specialty:  Addiction Medicine Why:  Referral faxed: 5/28 Contact information: [address] [place] [address] [phone]        [facility] Follow up on 11/14/2017.   Specialty:  Behavioral Health Why:  Hospital follow-up on Wed, 6/5 at 8:15AM. Please bring: photo ID, social security card, any proof of income if you have it, and medication list. Thank you.  Contact information: [address] [place] [address] [phone]        [facility] Follow up on 11/13/2017.   Why:  Screening for possible admission on Tuesday, 6/4 at 8:00AM. Please bring Photo ID/proof of [employer], clothing (no leggings), and medications/prescriptions provided by hospital. Thank you.  Contact information: [address] [place] [address] [phone]         Subjective Data:  Charlotte Strong is a 42 y/o F with history of MDD with psychotic features and polysubstance abuse who was admitted voluntarily after she presented to the ED with worsening depression, SI with plan to overdose on heroin, and relapse on multiple illicit substances. Pt was medically cleared and then transferred to [facility] for additional treatment and stabilization. Of note, pt has admissions to [facility] for similar presentations with discharges on 09/20/17 and 10/17/17. She was restarted on previous medications of remeron, olanzapine, and effexor, and she reported gradual improvement of her presenting symptoms during her stay.   Upon evaluation today, pt shares, "I'm good. I've been trying to get this lady on the phone at [facility]." Pt shares she has a phone interview at [facility] this morning for substance use  treatment after discharge. She reports that overall she is doing well in terms of mood and physical symptoms. She denies any specific concerns. She is sleeping well. Her appetite is good. She denies other physical complaints. She denies SI/HI/AH/VH. She is in agreement to continue her current treatment regimen without changes. She plans to follow up at [facility] for substance use treatment when a spot becomes available. She will have outpatient follow up at [facility]. She was able to engage in safety planning including plan to return to [facility] or contact emergency services if she feels unable to maintain her own safety or the safety of others. Pt had no further questions, comments, or concerns.   Plan Of Care/Follow-up recommendations:   -Discharge to outpatient level of care  -MDD with psychotic features -ContinueMirtazapine 7.5 mgpoqhs  -Continue Effexor-XR 75 mg PO Daily for depression. -Continue Zyprexa 5 mg PO QHS for mood stability  -GERD -Continue Protonix 20 mg PO Daily for GERD  -Anxiety - Continue Neurontin 100 mg PO TID -Continue vistaril  po q6h prn anxiety  -Asthma - Continue albuterol 157mcg/act take 1-2 puffs q6h prn SOB/wheezing  Activity:  as tolerated Diet:  normal Tests:  NA Other:  see above for DC plan  Micheal Likens, MD 11/08/2017, 10:03 AM

## 2017-11-08 NOTE — Plan of Care (Signed)
Patient self inventory: Patient slept well last night, sleep medication helped. Appetite has been good, energy level normal, and concentration good. Depression rated 2, hopelessness rated 2, and anxiety rated 0. Denies SI/HI/AVH. Denies physical problems. Patient's goal is "to go home today." Patient compliant with medication administration. Safety maintained with 15 minute checks. Will continue to monitor.   Problem: Education: Goal: Emotional status will improve Outcome: Adequate for Discharge

## 2018-01-07 ENCOUNTER — Other Ambulatory Visit: Payer: Self-pay

## 2018-01-07 ENCOUNTER — Emergency Department (HOSPITAL_COMMUNITY)
Admission: EM | Admit: 2018-01-07 | Discharge: 2018-01-07 | Disposition: A | Discharge status: Home / Self Care | Payer: Self-pay | Attending: Emergency Medicine | Admitting: Emergency Medicine

## 2018-01-07 DIAGNOSIS — F17210 Nicotine dependence, cigarettes, uncomplicated: Secondary | ICD-10-CM | POA: Insufficient documentation

## 2018-01-07 DIAGNOSIS — J45909 Unspecified asthma, uncomplicated: Secondary | ICD-10-CM | POA: Insufficient documentation

## 2018-01-07 DIAGNOSIS — Z79899 Other long term (current) drug therapy: Secondary | ICD-10-CM | POA: Insufficient documentation

## 2018-01-07 DIAGNOSIS — F1910 Other psychoactive substance abuse, uncomplicated: Secondary | ICD-10-CM

## 2018-01-07 DIAGNOSIS — T50905A Adverse effect of unspecified drugs, medicaments and biological substances, initial encounter: Secondary | ICD-10-CM | POA: Insufficient documentation

## 2018-01-07 DIAGNOSIS — Y658 Other specified misadventures during surgical and medical care: Secondary | ICD-10-CM | POA: Insufficient documentation

## 2018-01-07 DIAGNOSIS — T887XXA Unspecified adverse effect of drug or medicament, initial encounter: Secondary | ICD-10-CM | POA: Insufficient documentation

## 2018-01-07 LAB — URINALYSIS, ROUTINE W REFLEX MICROSCOPIC
Bilirubin Urine: NEGATIVE
GLUCOSE, UA: NEGATIVE mg/dL
Hgb urine dipstick: NEGATIVE
Ketones, ur: NEGATIVE mg/dL
Nitrite: POSITIVE — AB
PROTEIN: NEGATIVE mg/dL
Specific Gravity, Urine: 1.005 (ref 1.005–1.030)
pH: 6 (ref 5.0–8.0)

## 2018-01-07 LAB — COMPREHENSIVE METABOLIC PANEL
ALK PHOS: 63 U/L (ref 38–126)
ALT: 23 U/L (ref 0–44)
ANION GAP: 12 (ref 5–15)
AST: 22 U/L (ref 15–41)
Albumin: 4.2 g/dL (ref 3.5–5.0)
BILIRUBIN TOTAL: 1.2 mg/dL (ref 0.3–1.2)
BUN: 10 mg/dL (ref 6–20)
CALCIUM: 9.8 mg/dL (ref 8.9–10.3)
CO2: 21 mmol/L — AB (ref 22–32)
Chloride: 104 mmol/L (ref 98–111)
Creatinine, Ser: 0.74 mg/dL (ref 0.44–1.00)
Glucose, Bld: 111 mg/dL — ABNORMAL HIGH (ref 70–99)
Potassium: 3.5 mmol/L (ref 3.5–5.1)
SODIUM: 137 mmol/L (ref 135–145)
TOTAL PROTEIN: 7.8 g/dL (ref 6.5–8.1)

## 2018-01-07 LAB — CBC WITH DIFFERENTIAL/PLATELET
Basophils Absolute: 0 10*3/uL (ref 0.0–0.1)
Basophils Relative: 0 %
EOS ABS: 0.1 10*3/uL (ref 0.0–0.7)
Eosinophils Relative: 1 %
HCT: 44.5 % (ref 36.0–46.0)
HEMOGLOBIN: 15.7 g/dL — AB (ref 12.0–15.0)
LYMPHS ABS: 2 10*3/uL (ref 0.7–4.0)
Lymphocytes Relative: 23 %
MCH: 33 pg (ref 26.0–34.0)
MCHC: 35.3 g/dL (ref 30.0–36.0)
MCV: 93.5 fL (ref 78.0–100.0)
MONOS PCT: 9 %
Monocytes Absolute: 0.8 10*3/uL (ref 0.1–1.0)
NEUTROS PCT: 67 %
Neutro Abs: 5.7 10*3/uL (ref 1.7–7.7)
Platelets: 342 10*3/uL (ref 150–400)
RBC: 4.76 MIL/uL (ref 3.87–5.11)
RDW: 13.1 % (ref 11.5–15.5)
WBC: 8.5 10*3/uL (ref 4.0–10.5)

## 2018-01-07 LAB — RAPID URINE DRUG SCREEN, HOSP PERFORMED
Amphetamines: POSITIVE — AB
Barbiturates: NOT DETECTED
Benzodiazepines: NOT DETECTED
Cocaine: NOT DETECTED
Opiates: POSITIVE — AB
Tetrahydrocannabinol: POSITIVE — AB

## 2018-01-07 LAB — I-STAT BETA HCG BLOOD, ED (MC, WL, AP ONLY): I-stat hCG, quantitative: <5 m[IU]/mL (ref <5)

## 2018-01-07 LAB — ACETAMINOPHEN LEVEL

## 2018-01-07 LAB — ETHANOL

## 2018-01-07 LAB — SALICYLATE LEVEL

## 2018-01-07 MED ORDER — SODIUM CHLORIDE 0.9 % IV BOLUS
1000.0000 mL | Freq: Once | INTRAVENOUS | Status: AC
  Administered 2018-01-07: 1000 mL via INTRAVENOUS

## 2018-01-07 MED ORDER — ZIPRASIDONE MESYLATE 20 MG IM SOLR
20.0000 mg | Freq: Once | INTRAMUSCULAR | Status: AC
Start: 2018-01-07 — End: 2018-01-07
  Administered 2018-01-07: 20 mg via INTRAMUSCULAR
  Filled 2018-01-07: qty 20

## 2018-01-07 MED ORDER — STERILE WATER FOR INJECTION IJ SOLN
INTRAMUSCULAR | Status: AC
  Administered 2018-01-07: 1.2 mL
  Filled 2018-01-07: qty 10

## 2018-01-07 NOTE — ED Notes (Signed)
Pt will not hold still to get an accurate BP. Yelling for the doctor etc.
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
## 2018-01-07 NOTE — ED Triage Notes (Signed)
Pt did meth yesterday and has not had any since. Pt is reporting "fullness in head" and crawling sensation. Pt is writhing on the bed and yelling out and groaning.

## 2018-01-07 NOTE — ED Notes (Signed)
Bed: [id number] Expected date:  Expected time:  Means of arrival:  Comments: EMS 42 yo female coming down from meth

## 2018-01-07 NOTE — ED Notes (Signed)
Huntley DecSara: [phone]

## 2018-01-28 ENCOUNTER — Other Ambulatory Visit: Payer: Self-pay

## 2018-01-28 ENCOUNTER — Emergency Department (HOSPITAL_COMMUNITY)
Admission: EM | Admit: 2018-01-28 | Discharge: 2018-01-28 | Disposition: A | Discharge status: Home / Self Care | Payer: Self-pay | Attending: Emergency Medicine | Admitting: Emergency Medicine

## 2018-01-28 ENCOUNTER — Ambulatory Visit (HOSPITAL_COMMUNITY)
Admission: RE | Admit: 2018-01-28 | Discharge: 2018-01-28 | Disposition: A | Discharge status: Home / Self Care | Payer: No Typology Code available for payment source | Attending: Psychiatry | Admitting: Psychiatry

## 2018-01-28 ENCOUNTER — Encounter (HOSPITAL_COMMUNITY): Payer: Self-pay | Admitting: *Deleted

## 2018-01-28 DIAGNOSIS — F1220 Cannabis dependence, uncomplicated: Secondary | ICD-10-CM | POA: Insufficient documentation

## 2018-01-28 DIAGNOSIS — F419 Anxiety disorder, unspecified: Secondary | ICD-10-CM | POA: Insufficient documentation

## 2018-01-28 DIAGNOSIS — F329 Major depressive disorder, single episode, unspecified: Secondary | ICD-10-CM

## 2018-01-28 DIAGNOSIS — F1120 Opioid dependence, uncomplicated: Secondary | ICD-10-CM | POA: Insufficient documentation

## 2018-01-28 DIAGNOSIS — R45851 Suicidal ideations: Secondary | ICD-10-CM | POA: Insufficient documentation

## 2018-01-28 DIAGNOSIS — F17210 Nicotine dependence, cigarettes, uncomplicated: Secondary | ICD-10-CM | POA: Insufficient documentation

## 2018-01-28 DIAGNOSIS — F32A Depression, unspecified: Secondary | ICD-10-CM

## 2018-01-28 DIAGNOSIS — F322 Major depressive disorder, single episode, severe without psychotic features: Secondary | ICD-10-CM | POA: Insufficient documentation

## 2018-01-28 DIAGNOSIS — F4310 Post-traumatic stress disorder, unspecified: Secondary | ICD-10-CM | POA: Insufficient documentation

## 2018-01-28 DIAGNOSIS — F1910 Other psychoactive substance abuse, uncomplicated: Secondary | ICD-10-CM | POA: Insufficient documentation

## 2018-01-28 DIAGNOSIS — F1420 Cocaine dependence, uncomplicated: Secondary | ICD-10-CM | POA: Insufficient documentation

## 2018-01-28 DIAGNOSIS — J45909 Unspecified asthma, uncomplicated: Secondary | ICD-10-CM | POA: Insufficient documentation

## 2018-01-28 DIAGNOSIS — F1320 Sedative, hypnotic or anxiolytic dependence, uncomplicated: Secondary | ICD-10-CM | POA: Insufficient documentation

## 2018-01-28 DIAGNOSIS — Z9049 Acquired absence of other specified parts of digestive tract: Secondary | ICD-10-CM | POA: Insufficient documentation

## 2018-01-28 LAB — COMPREHENSIVE METABOLIC PANEL
ALBUMIN: 4.3 g/dL (ref 3.5–5.0)
ALK PHOS: 66 U/L (ref 38–126)
ALT: 19 U/L (ref 0–44)
AST: 23 U/L (ref 15–41)
Anion gap: 8 (ref 5–15)
BILIRUBIN TOTAL: 0.7 mg/dL (ref 0.3–1.2)
BUN: 10 mg/dL (ref 6–20)
CALCIUM: 9.5 mg/dL (ref 8.9–10.3)
CO2: 28 mmol/L (ref 22–32)
CREATININE: 0.81 mg/dL (ref 0.44–1.00)
Chloride: 102 mmol/L (ref 98–111)
GFR calc Af Amer: >60 mL/min (ref >60)
GFR calc non Af Amer: >60 mL/min (ref >60)
GLUCOSE: 102 mg/dL — AB (ref 70–99)
Potassium: 4.3 mmol/L (ref 3.5–5.1)
Sodium: 138 mmol/L (ref 135–145)
TOTAL PROTEIN: 7.5 g/dL (ref 6.5–8.1)

## 2018-01-28 LAB — SALICYLATE LEVEL: Salicylate Lvl: <7 mg/dL (ref 2.8–30.0)

## 2018-01-28 LAB — CBC
HEMATOCRIT: 47.3 % — AB (ref 36.0–46.0)
Hemoglobin: 15.6 g/dL — ABNORMAL HIGH (ref 12.0–15.0)
MCH: 31.6 pg (ref 26.0–34.0)
MCHC: 33 g/dL (ref 30.0–36.0)
MCV: 95.9 fL (ref 78.0–100.0)
Platelets: 363 10*3/uL (ref 150–400)
RBC: 4.93 MIL/uL (ref 3.87–5.11)
RDW: 12.9 % (ref 11.5–15.5)
WBC: 8.3 10*3/uL (ref 4.0–10.5)

## 2018-01-28 LAB — RAPID URINE DRUG SCREEN, HOSP PERFORMED
Amphetamines: NOT DETECTED
BARBITURATES: NOT DETECTED
BENZODIAZEPINES: NOT DETECTED
Cocaine: POSITIVE — AB
Opiates: POSITIVE — AB
Tetrahydrocannabinol: POSITIVE — AB

## 2018-01-28 LAB — I-STAT BETA HCG BLOOD, ED (MC, WL, AP ONLY)

## 2018-01-28 LAB — ACETAMINOPHEN LEVEL: Acetaminophen (Tylenol), Serum: <10 ug/mL — ABNORMAL LOW (ref 10–30)

## 2018-01-28 LAB — ETHANOL: Alcohol, Ethyl (B): <10 mg/dL (ref <10)

## 2018-01-28 MED ORDER — METHOCARBAMOL 500 MG PO TABS: 500.0000 mg | ORAL_TABLET | Freq: Three times a day (TID) | ORAL | Status: DC | PRN

## 2018-01-28 MED ORDER — DICYCLOMINE HCL 20 MG PO TABS: 20.0000 mg | ORAL_TABLET | Freq: Four times a day (QID) | ORAL | Status: DC | PRN

## 2018-01-28 MED ORDER — ONDANSETRON 4 MG PO TBDP: 4.0000 mg | ORAL_TABLET | Freq: Three times a day (TID) | ORAL | 0 refills | Status: DC | PRN

## 2018-01-28 MED ORDER — NAPROXEN 250 MG PO TABS: 500.0000 mg | ORAL_TABLET | Freq: Two times a day (BID) | ORAL | Status: DC | PRN

## 2018-01-28 MED ORDER — ONDANSETRON 4 MG PO TBDP: 4.0000 mg | ORAL_TABLET | Freq: Four times a day (QID) | ORAL | Status: DC | PRN

## 2018-01-28 MED ORDER — CLONIDINE HCL 0.1 MG PO TABS: 0.1000 mg | ORAL_TABLET | Freq: Every day | ORAL | Status: DC

## 2018-01-28 MED ORDER — LOPERAMIDE HCL 2 MG PO CAPS: 2.0000 mg | ORAL_CAPSULE | ORAL | Status: DC | PRN

## 2018-01-28 MED ORDER — DICYCLOMINE HCL 20 MG PO TABS: 20.0000 mg | ORAL_TABLET | Freq: Three times a day (TID) | ORAL | 0 refills | Status: DC | PRN

## 2018-01-28 MED ORDER — CLONIDINE HCL 0.1 MG PO TABS: ORAL_TABLET | ORAL | 0 refills | Status: DC

## 2018-01-28 MED ORDER — HYDROXYZINE HCL 25 MG PO TABS: 25.0000 mg | ORAL_TABLET | Freq: Four times a day (QID) | ORAL | Status: DC | PRN

## 2018-01-28 MED ORDER — CLONIDINE HCL 0.1 MG PO TABS
0.1000 mg | ORAL_TABLET | Freq: Four times a day (QID) | ORAL | Status: DC
  Administered 2018-01-28: 0.1 mg via ORAL
  Filled 2018-01-28: qty 1

## 2018-01-28 MED ORDER — CLONIDINE HCL 0.1 MG PO TABS: 0.1000 mg | ORAL_TABLET | Freq: Two times a day (BID) | ORAL | Status: DC

## 2018-01-28 MED ORDER — METHOCARBAMOL 500 MG PO TABS: 500.0000 mg | ORAL_TABLET | Freq: Three times a day (TID) | ORAL | 0 refills | Status: DC | PRN
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
## 2018-01-28 NOTE — ED Notes (Signed)
Belongings at desk

## 2018-01-28 NOTE — Consult Note (Signed)
Patient evaluated along with TTS Riley ChurchesKendall Garvin via Telepsych. Charlotte Strong is a 42 y.o. female [facility] for substance abuse and depression. [facility] and was provided outpatient resources. Patient has had multiple admissions for substance abuse and [facility], 10/12/17, and 11/04/17.  On exam: Patient is alert and oriented x 4, pleasant, and cooperative. Speech is clear and coherent. Mood is depressed and affect is congruent with mood. Patient reports two accidental overdoses the past week, but denies they were suicide attempts. Denies current suicidal thoughts. Denies homicidal thoughts and AVH. No indication that the patient is responding to internal stimuli. Reports last use of heroin and cocaine was last night around 10 pm.    General Appearance: Casual and Fairly Groomed  Eye Contact:  Good  Speech:  Clear and Coherent and Normal Rate  Volume:  Normal  Mood:  Anxious, Depressed and Hopeless  Affect:  Congruent and Depressed  Orientation:  Full (Time, Place, and Person)  Thought Content:  Logical and Hallucinations: None  Suicidal Thoughts:  No  Homicidal Thoughts:  No  Judgement:  Fair  Insight:  Good  Psychomotor Activity:  Normal  Assets:  Communication Skills Desire for Improvement Housing Intimacy Leisure Time Physical Health  ADL's:  Intact     Disposition: No evidence of imminent risk to self or others at present.   Patient does not meet criteria for psychiatric inpatient admission. Supportive therapy provided about ongoing stressors. Discussed crisis plan, support from social network, calling 911, coming to the Emergency Department, and calling Suicide Hotline. TTS provided with outpatient resources/substance abuse treatment resources.

## 2018-01-28 NOTE — ED Notes (Signed)
Pt wanded by security. 

## 2018-01-28 NOTE — H&P (Signed)
Behavioral Health Medical Screening Exam  Charlotte Strong is an 42 y.o. female. [facility] a friend with similar concerns related to "[facility]" reports last using yesterday at 10 pm. Chart reviewed patient has previous inpatient admission related to substance use. Patient is currently denying suicidal or homicidal ideations. Reports she and her friend  with attempt to be admitted at [facility]. Case was staffed with MD Lucianne MussKumar. Patient was provide with additional outpatient resources.   Total Time spent with patient: 15 minutes  Psychiatric Specialty Exam: Physical Exam  Vitals reviewed. Constitutional: She is oriented to person, place, and time. She appears well-developed.  Neurological: She is alert and oriented to person, place, and time.  Psychiatric: She has a normal mood and affect. Her behavior is normal.    Review of Systems  Constitutional: Negative for chills.  Musculoskeletal: Negative for back pain and joint pain.  Neurological: Negative for headaches.  Psychiatric/Behavioral: Positive for substance abuse.  All other systems reviewed and are negative.   There were no vitals taken for this visit.There is no height or weight on file to calculate BMI.  General Appearance: Casual  Eye Contact:  Fair  Speech:  Clear and Coherent  Volume:  Normal  Mood:  Anxious and Depressed  Affect:  Congruent  Thought Process:  Coherent  Orientation:  Full (Time, Place, and Person)  Thought Content:  Hallucinations: None  Suicidal Thoughts:  No  Homicidal Thoughts:  No  Memory:  Immediate;   Fair Recent;   Fair Remote;   Fair  Judgement:  Fair  Insight:  Fair  Psychomotor Activity:  Normal  Concentration: Concentration: Fair  Recall:  Fis[employer]Fair  Fund of Knowledge:Fair  Language: Fair  Akathisia:  No  Handed:  Right  AIMS (if indicated):     Assets:  Communication Skills Desire for Improvement Resilience Social Support  Sleep:       Musculoskeletal: Strength &  Muscle Tone: within normal limits Gait & Station: normal Patient leans: Right  There were no vitals taken for this visit.  B/P 110/75 HR 108 RR 18 Temp: 98.9 Sat 98 RM   Recommendations: CSW to provided resources for substances abuse and treatment programs  Based on my evaluation the patient does not appear to have an emergency medical condition.  [name], NP 01/28/2018, 11:32 AM

## 2018-01-28 NOTE — BH Assessment (Signed)
NP Nira ConnJason Berry recommends the pt be discharged with resources.  TT[facility].  Pt [facility].

## 2018-01-28 NOTE — ED Notes (Signed)
Belongings inventoried and in locker 3. Valuables in security.

## 2018-01-28 NOTE — ED Notes (Signed)
Pt stable and ambulatory for discharge, states understanding follow up.  

## 2018-01-28 NOTE — ED Triage Notes (Signed)
Pt in stating she needs help with depression, SI and her addiction to heroin, states she had an intentional overdose a few days ago that really scared her and she wants some help. Pt also uses cocaine, last use of everything was last night. Denies use of ETOH.
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
## 2018-01-28 NOTE — ED Notes (Signed)
Placed patient belongings in locker 1
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
## 2018-01-28 NOTE — ED Notes (Signed)
Heart Healthy Diet was ordered at 6:35 for Dinner, But It will be put in at 745, Because [place]. I was told if power come on sooner the Diet will be brought up sooner.

## 2018-01-31 ENCOUNTER — Emergency Department (HOSPITAL_COMMUNITY)
Admission: EM | Admit: 2018-01-31 | Discharge: 2018-02-01 | Disposition: A | Discharge status: Other Acute Inpatient Hospital | Payer: No Typology Code available for payment source | Attending: Emergency Medicine | Admitting: Emergency Medicine

## 2018-01-31 ENCOUNTER — Encounter (HOSPITAL_COMMUNITY): Payer: Self-pay | Admitting: Emergency Medicine

## 2018-01-31 ENCOUNTER — Other Ambulatory Visit: Payer: Self-pay

## 2018-01-31 DIAGNOSIS — Z590 Homelessness: Secondary | ICD-10-CM | POA: Insufficient documentation

## 2018-01-31 DIAGNOSIS — F1910 Other psychoactive substance abuse, uncomplicated: Secondary | ICD-10-CM | POA: Diagnosis present

## 2018-01-31 DIAGNOSIS — R45851 Suicidal ideations: Secondary | ICD-10-CM | POA: Insufficient documentation

## 2018-01-31 DIAGNOSIS — F322 Major depressive disorder, single episode, severe without psychotic features: Secondary | ICD-10-CM | POA: Diagnosis present

## 2018-01-31 DIAGNOSIS — F17210 Nicotine dependence, cigarettes, uncomplicated: Secondary | ICD-10-CM | POA: Insufficient documentation

## 2018-01-31 LAB — CBC WITH DIFFERENTIAL/PLATELET
BASOS ABS: 0.1 10*3/uL (ref 0.0–0.1)
Basophils Relative: 1 %
Eosinophils Absolute: 0.2 10*3/uL (ref 0.0–0.7)
Eosinophils Relative: 2 %
HCT: 45.2 % (ref 36.0–46.0)
Hemoglobin: 15.7 g/dL — ABNORMAL HIGH (ref 12.0–15.0)
LYMPHS PCT: 25 %
Lymphs Abs: 1.8 10*3/uL (ref 0.7–4.0)
MCH: 32.6 pg (ref 26.0–34.0)
MCHC: 34.7 g/dL (ref 30.0–36.0)
MCV: 93.8 fL (ref 78.0–100.0)
Monocytes Absolute: 0.7 10*3/uL (ref 0.1–1.0)
Monocytes Relative: 9 %
Neutro Abs: 4.7 10*3/uL (ref 1.7–7.7)
Neutrophils Relative %: 63 %
PLATELETS: 354 10*3/uL (ref 150–400)
RBC: 4.82 MIL/uL (ref 3.87–5.11)
RDW: 13.1 % (ref 11.5–15.5)
WBC: 7.5 10*3/uL (ref 4.0–10.5)

## 2018-01-31 LAB — RAPID URINE DRUG SCREEN, HOSP PERFORMED
Amphetamines: NOT DETECTED
BARBITURATES: NOT DETECTED
BENZODIAZEPINES: POSITIVE — AB
Cocaine: POSITIVE — AB
Tetrahydrocannabinol: POSITIVE — AB

## 2018-01-31 LAB — COMPREHENSIVE METABOLIC PANEL
ALBUMIN: 4 g/dL (ref 3.5–5.0)
ALT: 14 U/L (ref 0–44)
ANION GAP: 7 (ref 5–15)
AST: 20 U/L (ref 15–41)
Alkaline Phosphatase: 57 U/L (ref 38–126)
BILIRUBIN TOTAL: 0.6 mg/dL (ref 0.3–1.2)
BUN: 11 mg/dL (ref 6–20)
CHLORIDE: 101 mmol/L (ref 98–111)
CO2: 28 mmol/L (ref 22–32)
Calcium: 9.1 mg/dL (ref 8.9–10.3)
Creatinine, Ser: 0.75 mg/dL (ref 0.44–1.00)
GFR calc Af Amer: >60 mL/min (ref >60)
GFR calc non Af Amer: >60 mL/min (ref >60)
GLUCOSE: 94 mg/dL (ref 70–99)
POTASSIUM: 3.7 mmol/L (ref 3.5–5.1)
SODIUM: 136 mmol/L (ref 135–145)
TOTAL PROTEIN: 7 g/dL (ref 6.5–8.1)

## 2018-01-31 LAB — ACETAMINOPHEN LEVEL: Acetaminophen (Tylenol), Serum: <10 ug/mL — ABNORMAL LOW (ref 10–30)

## 2018-01-31 LAB — ETHANOL: Alcohol, Ethyl (B): <10 mg/dL (ref <10)

## 2018-01-31 LAB — I-STAT BETA HCG BLOOD, ED (MC, WL, AP ONLY): I-stat hCG, quantitative: <5 m[IU]/mL (ref <5)

## 2018-01-31 LAB — SALICYLATE LEVEL: Salicylate Lvl: <7 mg/dL (ref 2.8–30.0)

## 2018-01-31 MED ORDER — NICOTINE 21 MG/24HR TD PT24: 21.0000 mg | MEDICATED_PATCH | Freq: Once | TRANSDERMAL | Status: DC
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
## 2018-01-31 NOTE — ED Notes (Signed)
Pt ambulated to restroom. Pt spoke with this RN upon return to room. Pt states if she is discharged she will find a way to end her life. Pt states she has nothing left to live for. Pt cooperative and calm as she said this. Pt states her mother is deceased and the rest of her family "won't have anything else to do with her". Pt states "I'm done if I cant get help". Pt denies HI and denies AVH. Pt contracts for safety while on unit.

## 2018-01-31 NOTE — ED Notes (Signed)
Patient denies pain and is resting comfortably.  

## 2018-01-31 NOTE — Patient Outreach (Signed)
ED Peer Support Specialist Patient Intake (Complete at intake & 30-60 Day Follow-up)  Name: Charlotte Strong  MRN: [id number]  Age: 42 y.o.   Date of Admission: 01/31/2018  Intake: Initial Comments:      Primary Reason Admitted: Charlotte Strong is an 42 y.o. female present to [facility] with complaints of suicidal ideations and detox. Patient has presented to [facility] in the past two days with similar complaints, however, when TTS speaks with the patient she expresses she wants detox and denies being suicidal. Patient denies suicidal ideations, expressed she was suicidal earlier this day but currently denies. Patient requests detox, expressing she wants to get clean. Report she last abuse heroin, xanax, cocaine, and meth the day before yesterday. Report she got so high because she was told if she was under the influence she could get into detox faster. Per Bouloudene, RN, note patient stated, "Pt reports she is homeless, here for suicidal attempt and drugs and alcohol detox, reports attempted to kill herself by taking 12 of xanax and inhaled 4 lines of heroin , also used cocain and marijuana." Patient denies suicidal / homicidal ideations, denies auditory / visual hallucinations.   Lab values: Alcohol/ETOH: Negative Positive UDS? Yes Amphetamines: No Barbiturates: No Benzodiazepines: No Cocaine: Yes Opiates:   Cannabinoids: No  Demographic information: Gender: Female Ethnicity: White Marital Status: Married Insurance underwriter Status: Uninsured/Self-pay Ecologist (Work Neurosurgeon, Physicist, medical, etc.: No Lives with: Alone Living situation: Homeless  Reported Patient History: Patient reported health conditions: Anxiety disorders, Bipolar disorder, Depression Patient aware of HIV and hepatitis status: No  In past year, has patient visited ED for any reason? Yes  Number of ED visits: 7  Reason(s) for visit: same reasons   In past year, has  patient been hospitalized for any reason? No  Number of hospitalizations:    Reason(s) for hospitalization:    In past year, has patient been arrested? No  Number of arrests:    Reason(s) for arrest:    In past year, has patient been incarcerated?    Number of incarcerations:    Reason(s) for incarceration:    In past year, has patient received medication-assisted treatment? No  In past year, patient received the following treatments:    In past year, has patient received any harm reduction services? No  Did this include any of the following?    In past year, has patient received care from a mental health provider for diagnosis other than SUD? No  In past year, is this first time patient has overdosed? Yes(3 or 4 times )  Number of past overdoses:    In past year, is this first time patient has been hospitalized for an overdose? No  Number of hospitalizations for overdose(s):    Is patient currently receiving treatment for a mental health diagnosis? No  Patient reports experiencing difficulty participating in SUD treatment: No    Most important reason(s) for this difficulty?    Has patient received prior services for treatment? No  In past, patient has received services from following agencies:    Plan of Care:  Suggested follow up at these agencies/treatment centers: Day Treatment Program(Want to get into [facility] services)  Other information: CPSS met with Pt and was made aware that Pt was not feeling well an does wants to get into treatment. Pt still feels suicidal and addressed that she wants help. CPSS discussed the importance of Pt wanting to better the quality of her life. CPSS was able  to complete the series of questions and try to link her with inpatient services. CPSS was discussed the fact that Pt will fall back into the the same situation if does not get help. CPSS is waiting to see if Pt gets a bed at [facility].   Aaron Edelman Markhi Kleckner, [place]  01/31/2018 4:33  PM
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
## 2018-01-31 NOTE — ED Triage Notes (Signed)
Pt reports she is homeless, here for suicidal attempt and drugs and alcohol detox, reports attempted to kill herself by taking 12 of xanax and inhaled 4 lines of heroin , also used cocain and marijuana.

## 2018-02-01 ENCOUNTER — Encounter (HOSPITAL_COMMUNITY): Payer: Self-pay | Admitting: *Deleted

## 2018-02-01 ENCOUNTER — Other Ambulatory Visit: Payer: Self-pay

## 2018-02-01 ENCOUNTER — Inpatient Hospital Stay (HOSPITAL_COMMUNITY)
Admission: AD | Admit: 2018-02-01 | Discharge: 2018-02-06 | DRG: 885 | Payer: Federal, State, Local not specified - Other | Source: Intra-hospital | Attending: Psychiatry | Admitting: Psychiatry

## 2018-02-01 DIAGNOSIS — Z630 Problems in relationship with spouse or partner: Secondary | ICD-10-CM | POA: Diagnosis not present

## 2018-02-01 DIAGNOSIS — F1290 Cannabis use, unspecified, uncomplicated: Secondary | ICD-10-CM | POA: Diagnosis present

## 2018-02-01 DIAGNOSIS — Z634 Disappearance and death of family member: Secondary | ICD-10-CM | POA: Diagnosis not present

## 2018-02-01 DIAGNOSIS — B182 Chronic viral hepatitis C: Secondary | ICD-10-CM | POA: Diagnosis present

## 2018-02-01 DIAGNOSIS — G4700 Insomnia, unspecified: Secondary | ICD-10-CM | POA: Diagnosis present

## 2018-02-01 DIAGNOSIS — Z590 Homelessness: Secondary | ICD-10-CM

## 2018-02-01 DIAGNOSIS — F322 Major depressive disorder, single episode, severe without psychotic features: Secondary | ICD-10-CM

## 2018-02-01 DIAGNOSIS — F1120 Opioid dependence, uncomplicated: Secondary | ICD-10-CM | POA: Diagnosis present

## 2018-02-01 DIAGNOSIS — F419 Anxiety disorder, unspecified: Secondary | ICD-10-CM | POA: Diagnosis present

## 2018-02-01 DIAGNOSIS — Z915 Personal history of self-harm: Secondary | ICD-10-CM

## 2018-02-01 DIAGNOSIS — F332 Major depressive disorder, recurrent severe without psychotic features: Principal | ICD-10-CM | POA: Diagnosis present

## 2018-02-01 DIAGNOSIS — J45909 Unspecified asthma, uncomplicated: Secondary | ICD-10-CM | POA: Diagnosis present

## 2018-02-01 DIAGNOSIS — R45851 Suicidal ideations: Secondary | ICD-10-CM | POA: Diagnosis present

## 2018-02-01 DIAGNOSIS — Z6379 Other stressful life events affecting family and household: Secondary | ICD-10-CM

## 2018-02-01 DIAGNOSIS — F17203 Nicotine dependence unspecified, with withdrawal: Secondary | ICD-10-CM | POA: Diagnosis present

## 2018-02-01 DIAGNOSIS — F1320 Sedative, hypnotic or anxiolytic dependence, uncomplicated: Secondary | ICD-10-CM

## 2018-02-01 DIAGNOSIS — Z880 Allergy status to penicillin: Secondary | ICD-10-CM

## 2018-02-01 DIAGNOSIS — F1410 Cocaine abuse, uncomplicated: Secondary | ICD-10-CM | POA: Diagnosis present

## 2018-02-01 DIAGNOSIS — F17210 Nicotine dependence, cigarettes, uncomplicated: Secondary | ICD-10-CM | POA: Diagnosis not present

## 2018-02-01 MED ORDER — VENLAFAXINE HCL ER 75 MG PO CP24
75.0000 mg | ORAL_CAPSULE | Freq: Every day | ORAL | Status: DC
  Administered 2018-02-01: 75 mg via ORAL
  Filled 2018-02-01: qty 1

## 2018-02-01 MED ORDER — ALUM & MAG HYDROXIDE-SIMETH 200-200-20 MG/5ML PO SUSP: 30.0000 mL | ORAL | Status: DC | PRN

## 2018-02-01 MED ORDER — NICOTINE 21 MG/24HR TD PT24
21.0000 mg | MEDICATED_PATCH | Freq: Every day | TRANSDERMAL | Status: DC
  Administered 2018-02-02: 21 mg via TRANSDERMAL
  Filled 2018-02-01 (×4): qty 1

## 2018-02-01 MED ORDER — ENSURE ENLIVE PO LIQD
237.0000 mL | Freq: Two times a day (BID) | ORAL | Status: DC
  Administered 2018-02-02 – 2018-02-06 (×6): 237 mL via ORAL

## 2018-02-01 MED ORDER — MIRTAZAPINE 7.5 MG PO TABS
7.5000 mg | ORAL_TABLET | Freq: Every day | ORAL | Status: DC
  Administered 2018-02-01 – 2018-02-05 (×5): 7.5 mg via ORAL
  Filled 2018-02-01: qty 7
  Filled 2018-02-01 (×4): qty 1
  Filled 2018-02-01: qty 7
  Filled 2018-02-01 (×2): qty 1

## 2018-02-01 MED ORDER — OLANZAPINE 5 MG PO TABS
5.0000 mg | ORAL_TABLET | Freq: Every day | ORAL | Status: DC
  Administered 2018-02-01 – 2018-02-05 (×5): 5 mg via ORAL
  Filled 2018-02-01 (×2): qty 1
  Filled 2018-02-01: qty 7
  Filled 2018-02-01 (×2): qty 1
  Filled 2018-02-01: qty 7
  Filled 2018-02-01: qty 1
  Filled 2018-02-01: qty 2

## 2018-02-01 MED ORDER — MIRTAZAPINE 7.5 MG PO TABS: 7.5000 mg | ORAL_TABLET | Freq: Every day | ORAL | Status: DC

## 2018-02-01 MED ORDER — OLANZAPINE 5 MG PO TABS: 5.0000 mg | ORAL_TABLET | Freq: Every day | ORAL | Status: DC

## 2018-02-01 MED ORDER — VENLAFAXINE HCL ER 75 MG PO CP24
75.0000 mg | ORAL_CAPSULE | Freq: Every day | ORAL | Status: DC
  Administered 2018-02-02 – 2018-02-06 (×5): 75 mg via ORAL
  Filled 2018-02-01: qty 7
  Filled 2018-02-01 (×4): qty 1
  Filled 2018-02-01: qty 7
  Filled 2018-02-01: qty 1

## 2018-02-01 MED ORDER — ACETAMINOPHEN 325 MG PO TABS
650.0000 mg | ORAL_TABLET | Freq: Four times a day (QID) | ORAL | Status: DC | PRN
  Administered 2018-02-02 – 2018-02-04 (×3): 650 mg via ORAL
  Filled 2018-02-01 (×3): qty 2

## 2018-02-01 MED ORDER — GABAPENTIN 100 MG PO CAPS
100.0000 mg | ORAL_CAPSULE | Freq: Three times a day (TID) | ORAL | Status: DC
  Administered 2018-02-02 – 2018-02-06 (×13): 100 mg via ORAL
  Filled 2018-02-01: qty 21
  Filled 2018-02-01 (×4): qty 1
  Filled 2018-02-01: qty 21
  Filled 2018-02-01 (×8): qty 1
  Filled 2018-02-01: qty 21
  Filled 2018-02-01: qty 1
  Filled 2018-02-01: qty 21

## 2018-02-01 MED ORDER — NICOTINE 14 MG/24HR TD PT24
MEDICATED_PATCH | TRANSDERMAL | Status: AC
  Administered 2018-02-01: 19:00:00
  Filled 2018-02-01: qty 1

## 2018-02-01 MED ORDER — GABAPENTIN 100 MG PO CAPS
100.0000 mg | ORAL_CAPSULE | Freq: Three times a day (TID) | ORAL | Status: DC
  Administered 2018-02-01: 100 mg via ORAL
  Filled 2018-02-01: qty 1

## 2018-02-01 MED ORDER — ACETAMINOPHEN 325 MG PO TABS: 650.0000 mg | ORAL_TABLET | Freq: Four times a day (QID) | ORAL | Status: DC | PRN

## 2018-02-01 MED ORDER — MAGNESIUM HYDROXIDE 400 MG/5ML PO SUSP: 30.0000 mL | Freq: Every day | ORAL | Status: DC | PRN

## 2018-02-01 NOTE — ED Notes (Signed)
Patient denies pain and is resting comfortably.  

## 2018-02-01 NOTE — Patient Outreach (Signed)
CPSS met with the patient to provide further substance use recovery support and help with resources. Patient's plan is to get connected with detox center so afterwards she can go to [facility] for residential substance use treatment. Patient plans to go to [facility] for detox. CPSS provided information regarding [facility] and other detox treatment programs like [facility] and [facility]. CPSS also provided the patient with PART/[facility] bus passes, NA meeting list, and CPSS contact information. CPSS strongly encouraged the patient to follow up with CPSS for further recovery support and help with recovery resources after discharge from the [facility].

## 2018-02-01 NOTE — ED Notes (Signed)
Educated on discharge instructions and follow up. Patient has all belongings.

## 2018-02-01 NOTE — Tx Team (Signed)
Initial Treatment Plan 02/01/2018 6:25 PM Charlotte Christella HartiganJacobs [id number]    PATIENT STRESSORS: Financial difficulties Marital or family conflict Substance abuse   PATIENT STRENGTHS: Average or above average intelligence Communication skills Motivation for treatment/growth   PATIENT IDENTIFIED PROBLEMS: Depression  Polysubstance Abuse  "I'm tried to get into [facility], but I need to be detoxed."  "I can't go back home until I go to treatment"  Suicide risk  Marital conflict           DISCHARGE CRITERIA:  Improved stabilization in mood, thinking, and/or behavior Need for constant or close observation no longer present Withdrawal symptoms are absent or subacute and managed without 24-hour nursing intervention  PRELIMINARY DISCHARGE PLAN: Attend 12-step recovery group Return to previous work or school arrangements  PATIENT/FAMILY INVOLVEMENT: This treatment plan has been presented to and reviewed with the patient, [name].  The patient and family have been given the opportunity to ask questions and make suggestions.  Cranford MonBeaudry, Tyon Cerasoli Evans, RN 02/01/2018, 6:25 PM
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
## 2018-02-01 NOTE — BH Assessment (Signed)
[facility]  Per Juanetta BeetsJacqueline Norman, DO, this pt requires psychiatric hospitalization at this time.  Berneice Heinrichina Tate, RN, [id number]; [facility] will be ready to receive pt at 15:00.  Pt has signed Voluntary Admission and Consent for Treatment, and signed [facility].  Pt's nurse, Angelique BlonderDenise, has been notified, and agrees to send original paperwork along with pt via [facility], and to call report to [phone].  Doylene Canninghomas Fitzhugh Vizcarrondo, [place] Behavioral Health Coordinator [phone]
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
## 2018-02-02 ENCOUNTER — Encounter (HOSPITAL_COMMUNITY): Payer: Self-pay | Admitting: Registered Nurse

## 2018-02-02 DIAGNOSIS — F1320 Sedative, hypnotic or anxiolytic dependence, uncomplicated: Secondary | ICD-10-CM

## 2018-02-02 DIAGNOSIS — F17210 Nicotine dependence, cigarettes, uncomplicated: Secondary | ICD-10-CM

## 2018-02-02 MED ORDER — LORAZEPAM 1 MG PO TABS
1.0000 mg | ORAL_TABLET | Freq: Four times a day (QID) | ORAL | Status: AC | PRN
  Administered 2018-02-03: 1 mg via ORAL
  Filled 2018-02-02: qty 1

## 2018-02-02 MED ORDER — ONDANSETRON 4 MG PO TBDP: 4.0000 mg | ORAL_TABLET | Freq: Four times a day (QID) | ORAL | Status: AC | PRN

## 2018-02-02 MED ORDER — ADULT MULTIVITAMIN W/MINERALS CH
1.0000 | ORAL_TABLET | Freq: Every day | ORAL | Status: DC
  Administered 2018-02-02 – 2018-02-06 (×5): 1 via ORAL
  Filled 2018-02-02 (×7): qty 1

## 2018-02-02 MED ORDER — VITAMIN B-1 100 MG PO TABS
100.0000 mg | ORAL_TABLET | Freq: Every day | ORAL | Status: DC
  Administered 2018-02-03 – 2018-02-06 (×4): 100 mg via ORAL
  Filled 2018-02-02 (×5): qty 1

## 2018-02-02 MED ORDER — HYDROXYZINE HCL 25 MG PO TABS
25.0000 mg | ORAL_TABLET | Freq: Four times a day (QID) | ORAL | Status: AC | PRN
  Administered 2018-02-03 – 2018-02-04 (×2): 25 mg via ORAL
  Filled 2018-02-02: qty 1
  Filled 2018-02-02 (×2): qty 10
  Filled 2018-02-02: qty 1

## 2018-02-02 MED ORDER — THIAMINE HCL 100 MG/ML IJ SOLN: 100.0000 mg | Freq: Once | INTRAMUSCULAR | Status: DC

## 2018-02-02 MED ORDER — LOPERAMIDE HCL 2 MG PO CAPS: 2.0000 mg | ORAL_CAPSULE | ORAL | Status: AC | PRN

## 2018-02-02 MED ORDER — NICOTINE POLACRILEX 2 MG MT GUM
2.0000 mg | CHEWING_GUM | OROMUCOSAL | Status: DC | PRN
  Administered 2018-02-02 – 2018-02-06 (×7): 2 mg via ORAL
  Filled 2018-02-02: qty 1
  Filled 2018-02-02 (×2): qty 10

## 2018-02-02 NOTE — [facility] Group Notes (Signed)
[facility] Group Notes:  (Nursing/MHT/Case Management/Adjunct)  Date:  02/02/2018  Time:  1:40 PM  Type of Therapy:  Psychoeducational Skills  Participation Level:  Active  Participation Quality:  Appropriate  Affect:  Appropriate  Cognitive:  Appropriate  Insight:  Appropriate  Engagement in Group:  Engaged  Modes of Intervention:  Problem-solving  Summary of Progress/Problems: Pt attended Psychoeducational group with top topic anger management.   Quinesha Selinger Shanta 02/02/2018, 1:40 PM 
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
## 2018-02-02 NOTE — Plan of Care (Signed)
  Problem: Education: Goal: Knowledge of [place] [facility] Education information/materials will improve Outcome: Progressing   

## 2018-02-02 NOTE — Progress Notes (Signed)
Patient did attend the first half of the  evening speaker AA meeting. Pt left group and returned to lie in her bed.

## 2018-02-02 NOTE — BHH Group Notes (Signed)
LCSW Group Therapy Note  02/02/2018   10:00--11:00am   Type of Therapy and Topic:  Group Therapy: Anger Cues and Responses  Participation Level:  None   Description of Group:   In this group, patients learned how to recognize the physical, cognitive, emotional, and behavioral responses they have to anger-provoking situations.  They identified a recent time they became angry and how they reacted.  They analyzed how their reaction was possibly beneficial and how it was possibly unhelpful.  The group discussed a variety of healthier coping skills that could help with such a situation in the future.  Deep breathing was practiced briefly.  Therapeutic Goals: 1. Patients will remember their last incident of anger and how they felt emotionally and physically, what their thoughts were at the time, and how they behaved. 2. Patients will identify how their behavior at that time worked for them, as well as how it worked against them. 3. Patients will explore possible new behaviors to use in future anger situations. 4. Patients will learn that anger itself is normal and cannot be eliminated, and that healthier reactions can assist with resolving conflict rather than worsening situations.  Summary of Patient Progress:  The patient attended however did not participate she was asked for her opinion but declined to answer stating " I don't feel good".  Therapeutic Modalities:   Cognitive Behavioral Therapy  Evorn Gongonnie D Zerenity Bowron

## 2018-02-02 NOTE — [facility] Suicide Risk Assessment (Signed)
[facility]   Nursing information obtained from:  Patient Demographic factors:  Caucasian, Low socioeconomic status, Unemployed Current Mental Status:  Suicide plan, Self-harm thoughts Loss Factors:  Legal issues Historical Factors:  Prior suicide attempts Risk Reduction Factors:  Sense of responsibility to family  Total Time spent with patient: 45 minutes Principal Problem: opiate dependence, depression Diagnosis:   Patient Active Problem List   Diagnosis Date Noted  . Major depressive disorder, recurrent severe without psychotic features (HCC) [F33.2] 02/01/2018  . MDD (major depressive disorder), recurrent severe, without psychosis (HCC) [F33.2] 11/04/2017  . Polysubstance dependence (HCC) [F19.20]   . Chronic hepatitis C without hepatic coma (HCC) [B18.2]   . MDD (major depressive disorder), recurrent episode, severe (HCC) [F33.2] 10/12/2017  . GERD (gastroesophageal reflux disease) [K21.9] 09/15/2017  . Major depressive disorder, single episode, severe without psychosis (HCC) [F32.2] 09/13/2017  . Opioid use disorder, severe, dependence (HCC) [F11.20] 09/24/2015  . Polysubstance abuse (HCC) [F19.10]   . Suicidal ideation [R45.851]   . Left foot drop [M21.372] 12/01/2014  . Left wrist drop [M21.332] 12/01/2014  . Substance induced mood disorder (HCC) [F19.94] 10/13/2014  . 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 Compartment syndrome of lower extremity (HCC) [T79.A29A] 10/08/2014  . Wheezing [R06.2]   . Neuropathic pain [M79.2]   . Depression with anxiety [F41.8]   . Palliative care encounter [Z51.5]   . Pain [R52]   . Encephalopathy acute [G93.40]   . Aspiration pneumonia (HCC) [J69.0]   . Altered mental status [R41.82] 09/30/2014  . Acute renal failure (HCC) [N17.9] 09/30/2014  . Rhabdomyolysis [M62.82] 09/30/2014  . Acute respiratory failure with hypoxia (HCC) [J96.01] 09/30/2014  . Lactic acidosis [E87.2] 09/30/2014    Subjective Data:   Continued Clinical Symptoms:  Alcohol Use Disorder Identification Test Final Score (AUDIT): 0 The "Alcohol Use Disorders Identification Test", Guidelines for Use in Primary Care, Second Edition.  World [employer] writer[employer] [facility](WHO). Score between 0-7:  no or low risk or alcohol related problems. Score between 8-15:  moderate risk of alcohol related problems. Score between 16-19:  high risk of alcohol related problems. Score 20 or above:  warrants further diagnostic evaluation for alcohol dependence and treatment.   CLINICAL FACTORS:  42 year old female, married ( separated ), three adult children, currently homeless, has been living in hotels.  She presented to hospital voluntarily with worsening depression , suicidal ideations. Reports history of  Benzodiazepine ( Xanax) / Opiate( Heroin) Dependencies and states that she has been attempting to get inpatient/rehab treatment.Reports she went to [facility]  but had been turned away due to positive UDS. States she has been using Xanax heavily and took about 22 Xanax tablets over the last 2- 3 days. States she last used Heroin 6 days ago. Denies alcohol use . 8/22 UDS positive for BZD, Cocaine, Cannabis, Admission BAL negative States she has not been taking psychiatric medications recently.  Patient reports history of one prior suicidal attempt by overdose in 2016. She states her mood tends to improve during periods of sobriety. Denies history of psychosis. She is known to our unit from prior psychiatric admissions , most recently May 2019. At the time was admitted with depression, suicidal ideations , opiate dependence. Was stabilized, discharged on Effexor XR, Zyprexa, Remeron, Neurontin. She states these medications were well tolerated and very helpful.   Medical History remarkable for Hep C   Dx- Benzodiazepine / Opiate Use Disorder, Substance Induced Mood Disorder  Plan- Inpatient admission  Has been restarted on Neurontin  100 mgrs TID, Remeron 7.5 mgrs QHS, Effexor XR 75 mgrs QHS, Zyprexa 5 mgrs QHS  Currently she is not presenting with symptoms of WDL- no tremors, no diaphoresis, no psychomotor agitation, no restlessness, vitals are stable. Will add Ativan PRN for potential BZD WDL if needed .       Musculoskeletal: Strength & Muscle Tone: within normal limits Gait & Station: normal Patient leans: N/A  Psychiatric Specialty Exam: Physical Exam  ROS no headache, no visual disturbances, no chest pain, no shortness of breath, no vomiting , no fever, no chills   Blood pressure 132/89, pulse 93, temperature 99.3 F (37.4 C), temperature source Oral, resp. rate 18, height 5\' 5"  (1.651 m), weight 56.2 kg, SpO2 99 %.Body mass index is 20.63 kg/m.  General Appearance: Fairly Groomed  Eye Contact:  Good  Speech:  Normal Rate  Volume:  Normal  Mood:  reports " I am already feeling better"  Affect:  Appropriate and slightly anxious   Thought Process:  Linear and Descriptions of Associations: Intact  Orientation:  Full (Time, Place, and Person)  Thought Content:  no hallucinations, no delusions, not internally preoccupied   Suicidal Thoughts:  No denies suicidal or self injurious ideations, denies homicidal or violent ideations, contracts for safety on unit   Homicidal Thoughts:  No  Memory:  recent and remote grossly intact   Judgement:  Fair  Insight:  Fair  Psychomotor Activity:  Normal- no psychomotor agitation or restlessness   Concentration:  Concentration: Good and Attention Span: Good  Recall:  Good  Fund of Knowledge:  Good  Language:  Good  Akathisia:  Negative  Handed:  Right  AIMS (if indicated):     Assets:  Communication Skills Desire for Improvement Resilience  ADL's:  Intact  Cognition:  WNL  Sleep:  Number of Hours: 6.25      COGNITIVE FEATURES THAT CONTRIBUTE TO RISK:  Closed-mindedness and Loss of executive function    SUICIDE RISK:   Moderate:  Frequent suicidal ideation  with limited intensity, and duration, some specificity in terms of plans, no associated intent, good self-control, limited dysphoria/symptomatology, some risk factors present, and identifiable protective factors, including available and accessible social support.  PLAN OF CARE: Patient will be admitted to inpatient psychiatric unit for stabilization and safety. Will provide and encourage milieu participation. Provide medication management and maked adjustments as needed.  Will follow daily.    I certify that inpatient services furnished can reasonably be expected to improve the patient's condition.   Craige Cotta, MD 02/02/2018, 5:46 PM
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
## 2018-02-02 NOTE — Progress Notes (Signed)
D: Patient observed up and visible in the milieu this evening. Patient states "I'm doing ok. I'm glad to be here so I can get some help. I've been here before. I'm kinda embarrassed that I'm back." Patient's affect anxious, flat with congruent mood.  Denies pain, physical complaints.   A: Medicated per orders, no prns requested or required. Medication education provided. Level III obs in place for safety. Emotional support offered. Patient encouraged to complete Suicide Safety Plan before discharge. Encouraged to attend and participate in unit programming.   R: Patient verbalizes understanding of POC. Patient denies SI/HI/AVH and remains safe on level III obs. Will continue to monitor throughout the night.

## 2018-02-03 MED ORDER — MIRTAZAPINE 7.5 MG PO TABS
7.5000 mg | ORAL_TABLET | Freq: Once | ORAL | Status: AC
  Administered 2018-02-03: 7.5 mg via ORAL
  Filled 2018-02-03 (×2): qty 1

## 2018-02-03 MED ORDER — HYDROXYZINE HCL 25 MG PO TABS
25.0000 mg | ORAL_TABLET | Freq: Once | ORAL | Status: AC
  Administered 2018-02-03: 25 mg via ORAL
  Filled 2018-02-03 (×2): qty 1

## 2018-02-03 NOTE — Progress Notes (Signed)
D: Patient observed up and active on the unit. Interactive with peers, staff though remains somewhat cautious and tentative. Patient states she is less anxious today. Hopeful for long term treatment options. Patient's affect anxious, mood congruent.  Denies pain, physical complaints. No evidence of significant withdrawal - slight anxiety noted. CIWA is a "2", BP slightly elevated as it has been since admit and prior to.  A: Medicated per orders, no prns requested or required. Medication education provided. Level III obs in place for safety. Emotional support offered. Patient encouraged to complete Suicide Safety Plan before discharge. Encouraged to attend and participate in unit programming. Patient educated on signs and symptoms of benzo withdrawal as patient states she was using them daily.    R: Patient verbalizes understanding of POC, withdrawal education. Patient denies SI/HI/AVH and remains safe on level III obs. Will continue to monitor throughout the night.

## 2018-02-03 NOTE — [facility] Group Notes (Addendum)
[facility] LCSW Group Therapy Note  Date/Time:  02/03/2018 10:00-11:00AM  Type of Therapy and Topic:  Group Therapy:  Healthy and Unhealthy Supports  Participation Level:  Minimal   Description of Group:  Patients in this group were introduced to the idea of adding a variety of healthy supports to address the various needs in their lives.Patients discussed what additional healthy supports could be helpful in their recovery and wellness after discharge in order to prevent future hospitalizations.   An emphasis was placed on using counselor, doctor, therapy groups, 12-step groups, and problem-specific support groups to expand supports.  They also worked as a group on developing a specific plan for several patients to deal with unhealthy supports through boundary-setting, psychoeducation with loved ones, and even termination of relationships.   Therapeutic Goals:   1)  discuss importance of adding supports to stay well once out of the hospital  2)  compare healthy versus unhealthy supports and identify some examples of each  3)  generate ideas and descriptions of healthy supports that can be added  4)  offer mutual support about how to address unhealthy supports  5)  encourage active participation in and adherence to discharge plan    Summary of Patient Progress:  The patient stated that current healthy supports in her life are family but added that she has some work to do repair the relationships. While current unhealthy supports include the people she used with. The patient was attentive but did not speak much more during group.   Therapeutic Modalities:   Motivational Interviewing Brief Solution-Focused Therapy  Evorn Gongonnie D Erwin Nishiyama

## 2018-02-03 NOTE — [facility] Suicide Risk Assessment (Signed)
[facility] INPATIENT:  Family/Significant Other Suicide Prevention Education  Suicide Prevention Education:  Education Completed; Earl LagosRichard Gills,   has been identified by the patient as the individual  with whom  identified as the person who will aid the patient in the event of a mental health crisis (suicidal ideations/suicide attempt).  With written consent from the patient, the friend has been provided the following suicide prevention education, prior to the and/or following the discharge of the patient.  The suicide prevention education provided includes the following:  Suicide risk factors  Suicide prevention and interventions  National Suicide Hotline telephone number  [facility] assessment telephone number  [facility] 911  [facility] and/or Residential Mobile Crisis Unit telephone number  Request made of family/significant other to:  Remove weapons (e.g., guns, rifles, knives), all items previously/currently identified as safety concern.    Remove drugs/medications (over-the-counter, prescriptions, illicit drugs), all items previously/currently identified as a safety concern.  The family member/significant other verbalizes understanding of the suicide prevention education information provided.  The family member/significant other agrees to remove the items of safety concern listed above.  [name] Merlyn Conley 02/03/2018, 3:16 PM

## 2018-02-03 NOTE — Plan of Care (Signed)
  Problem: Education: Goal: Knowledge of [facility] Education information/materials will improve Outcome: Progressing Goal: Verbalization of understanding the information provided will improve Outcome: Progressing Patient able to verbalize understanding of information, education provided.   Problem: Coping: Goal: Ability to interact with others will improve Outcome: Progressing Patient is interactive on the unit. Behavior is appropriate.

## 2018-02-03 NOTE — Progress Notes (Signed)
D Pt has been seen out in the milieu today...she is seen interacting approrpaitely with her peers today. She talks on the phone and she watches TV much of the day.     A She completed her daily assessment and on this she wrote she deneid SI  Today and she rated her dperession, hopelessness and axnetiy" 5/5/5/" today.tolerated well. SHe says she told Dr. Jama Flavorsobos today that she does not wanna be discharged form here " unless I can go straight to rehab from here..ibuprofen'm tired of relapsing and going to the street".     R Pt offered pos support and Clinical research associatewriter discussed ways pt could modify behaviors/

## 2018-02-03 NOTE — [facility] Suicide Risk Assessment (Signed)
[facility] INPATIENT:  Family/Significant Other Suicide Prevention Education  Suicide Prevention Education:  Contact Attempts: Charlotte LagosRichard Strong  has been identified by the patient as the family member/significant other with whom the patient will be residing, and identified as the person(s) who will aid the patient in the event of a mental health crisis.  With written consent from the patient, one attempt was made to provide suicide prevention education, prior to and/or following the patient's discharge.   CSW will reach out to [name] at least once more today. Date and time of first attempt: 919/02/04/2019   [name] [name] 02/03/2018, 9:32 AM

## 2018-02-03 NOTE — [facility] Suicide Risk Assessment (Signed)
[facility] INPATIENT:  Family/Significant Other Suicide Prevention Education  Suicide Prevention Education:  Contact Attempts: Einar Gradichard Gill, has been identified by the patient as the family member/significant other with whom the patient will be residing, and identified as the person(s) who will aid the patient in the event of a mental health crisis.  With written consent from the patient, two attempts were made to provide suicide prevention education, prior to and/or following the patient's discharge.  We were unsuccessful in providing suicide prevention education.  A suicide education pamphlet was given to the patient to share with family/significant other.  Date and time of first attempt:919/02/04/2019 Date and time of second attempt:1124/02/04/2019  [name] Neeley Sedivy 02/03/2018, 11:39 AM

## 2018-02-03 NOTE — Progress Notes (Signed)
Nutrition Brief Note  Patient identified on the Malnutrition Screening Tool (MST) Report  Weight is stable. Ensure has been ordered for patient per ONS protocol.  Wt Readings from Last 15 Encounters:  02/01/18 56.2 kg  01/31/18 57.2 kg  11/04/17 54.9 kg  11/04/17 59 kg  10/20/17 56.7 kg  10/12/17 54.4 kg  09/13/17 56.7 kg  09/13/17 59 kg  03/15/17 49 kg  03/15/17 52.2 kg  09/10/15 57.6 kg  05/27/15 55.8 kg  10/14/14 56.8 kg  10/04/14 65.8 kg  10/07/13 52.2 kg    Body mass index is 20.63 kg/m. Patient meets criteria for normal based on current BMI.   Labs and medications reviewed.   No nutrition interventions warranted at this time. If nutrition issues arise, please consult RD.   Tilda FrancoLindsey Brandilyn Nanninga, MS, RD, LDN [facility] Clinical Dietitian Pager: [phone] After Hours Pager: [phone]

## 2018-02-03 NOTE — Plan of Care (Signed)
  Problem: Safety: Goal: Periods of time without injury will increase Outcome: Progressing Note:  Pt has not harmed self or others tonight.  She denies SI/HI and verbally contracts for safety.    

## 2018-02-03 NOTE — Progress Notes (Signed)
D: Pt was in dayroom upon initial approach.  Pt presents with anxious affect and mood.  She reports her day as been "good" besides being on her "period."  Her goal is to "call these places tomorrow.  I'm going to call [facility] in the morning and see if they might have a bed for me there."  Pt denies SI/HI, denies hallucinations, complains of abdominal pain of 4/10.  Pt has been visible in milieu interacting with peers and staff appropriately.  Pt attended evening group.    A: Introduced self to pt.  Met with pt 1:1 and actively listened to pt and offered support and encouragement.  Medications administered per order.  PRN medication administered for anxiety and pain.  Heat packs provided for pain.  Pt continued to report difficulty sleeping after HS medications.  On-site provider notified and Vistaril 25 mg POX1 and Remeron 7.5 mg POX1 was ordered and administered.  Q15 minute safety checks maintained.  R: Pt is safe on the unit.  Pt is compliant with medications.  Pt verbally contracts for safety.  Will continue to monitor and assess.
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
## 2018-02-04 DIAGNOSIS — F419 Anxiety disorder, unspecified: Secondary | ICD-10-CM

## 2018-02-04 DIAGNOSIS — F332 Major depressive disorder, recurrent severe without psychotic features: Principal | ICD-10-CM

## 2018-02-04 DIAGNOSIS — F1120 Opioid dependence, uncomplicated: Secondary | ICD-10-CM

## 2018-02-04 DIAGNOSIS — G4700 Insomnia, unspecified: Secondary | ICD-10-CM

## 2018-02-04 NOTE — Progress Notes (Signed)
Patient is requesting a change in sleep medication. Currently is taking Remeron 7.5 mg and says it's not working well. Asked to be switched to Trazodone.

## 2018-02-04 NOTE — Tx Team (Signed)
Interdisciplinary Treatment and Diagnostic Plan Update  02/04/2018 Time of Session: [id number] Renato GailsCrystal Shimel MRN: [id number]  Principal Diagnosis: MDD, recurrent, severe  Secondary Diagnoses: Active Problems:   Major depressive disorder, recurrent severe without psychotic features (HCC)   Benzodiazepine dependence (HCC)   Current Medications:  Current Facility-Administered Medications  Medication Dose Route Frequency Provider Last Rate Last Dose  . acetaminophen (TYLENOL) tablet 650 mg  650 mg Oral Q6H PRN Charm RingsLord, Jamison Y, NP   650 mg at 02/03/18 1933  . alum & mag hydroxide-simeth (MAALOX/MYLANTA) 200-200-20 MG/5ML suspension 30 mL  30 mL Oral Q4H PRN Charm RingsLord, Jamison Y, NP      . feeding supplement (ENSURE ENLIVE) (ENSURE ENLIVE) liquid 237 mL  237 mL Oral BID BM Cobos, Rockey SituFernando A, MD   237 mL at 02/03/18 0954  . gabapentin (NEURONTIN) capsule 100 mg  100 mg Oral TID Charm RingsLord, Jamison Y, NP   100 mg at 02/04/18 0835  . hydrOXYzine (ATARAX/VISTARIL) tablet 25 mg  25 mg Oral Q6H PRN Cobos, Rockey SituFernando A, MD   25 mg at 02/03/18 2105  . loperamide (IMODIUM) capsule 2-4 mg  2-4 mg Oral PRN Cobos, Rockey SituFernando A, MD      . LORazepam (ATIVAN) tablet 1 mg  1 mg Oral Q6H PRN Cobos, Rockey SituFernando A, MD   1 mg at 02/03/18 0957  . magnesium hydroxide (MILK OF MAGNESIA) suspension 30 mL  30 mL Oral Daily PRN Charm RingsLord, Jamison Y, NP      . mirtazapine (REMERON) tablet 7.5 mg  7.5 mg Oral QHS Charm RingsLord, Jamison Y, NP   7.5 mg at 02/03/18 2105  . multivitamin with minerals tablet 1 tablet  1 tablet Oral Daily Cobos, Rockey SituFernando A, MD   1 tablet at 02/04/18 0835  . nicotine polacrilex (NICORETTE) gum 2 mg  2 mg Oral PRN Cobos, Rockey SituFernando A, MD   2 mg at 02/03/18 1456  . OLANZapine (ZYPREXA) tablet 5 mg  5 mg Oral QHS Charm RingsLord, Jamison Y, NP   5 mg at 02/03/18 2105  . ondansetron (ZOFRAN-ODT) disintegrating tablet 4 mg  4 mg Oral Q6H PRN Cobos, Rockey SituFernando A, MD      . thiamine (B-1) injection 100 mg  100 mg Intramuscular Once Cobos, Fernando A,  MD      . thiamine (VITAMIN B-1) tablet 100 mg  100 mg Oral Daily Cobos, Rockey SituFernando A, MD   100 mg at 02/04/18 0835  . venlafaxine XR (EFFEXOR-XR) 24 hr capsule 75 mg  75 mg Oral Daily Charm RingsLord, Jamison Y, NP   75 mg at 02/04/18 [id number]   PTA Medications: Medications Prior to Admission  Medication Sig Dispense Refill Last Dose  . albuterol (PROVENTIL HFA;VENTOLIN HFA) 108 (90 Base) MCG/ACT inhaler Inhale 1-2 puffs into the lungs every 6 (six) hours as needed for wheezing or shortness of breath. (Patient not taking: Reported on 01/28/2018)   Not Taking at Unknown time  . cloNIDine (CATAPRES) 0.1 MG tablet Take 1 tablet (0.1 mg total) by mouth 3 (three) times daily for 1 day, THEN 1 tablet (0.1 mg total) 2 (two) times daily for 1 day, THEN 1 tablet (0.1 mg total) daily for 1 day. 6 tablet 0 not picked up yet  . dicyclomine (BENTYL) 20 MG tablet Take 1 tablet (20 mg total) by mouth 3 (three) times daily as needed for spasms. (Patient not taking: Reported on 01/31/2018) 10 tablet 0 Not Taking at Unknown time  . gabapentin (NEURONTIN) 100 MG capsule Take 1 capsule (100 mg total)  by mouth 3 (three) times daily. For agitation (Patient not taking: Reported on 01/28/2018) 90 capsule 0 Not Taking at Unknown time  . methocarbamol (ROBAXIN) 500 MG tablet Take 1 tablet (500 mg total) by mouth every 8 (eight) hours as needed for muscle spasms. 10 tablet 0 not picked up yet  . mirtazapine (REMERON) 7.5 MG tablet Take 1 tablet (7.5 mg total) by mouth at bedtime. For sleep (Patient not taking: Reported on 01/28/2018) 30 tablet 0 Not Taking at Unknown time  . OLANZapine (ZYPREXA) 5 MG tablet Take 1 tablet (5 mg total) by mouth at bedtime. For mood control (Patient not taking: Reported on 01/28/2018) 30 tablet 0 Not Taking at Unknown time  . ondansetron (ZOFRAN-ODT) 4 MG disintegrating tablet Take 1 tablet (4 mg total) by mouth every 8 (eight) hours as needed for nausea or vomiting. 8 tablet 0 not picked up yet  . venlafaxine XR  (EFFEXOR-XR) 75 MG 24 hr capsule Take 1 capsule (75 mg total) by mouth daily. 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   Physician Treatment Plan for Primary Diagnosis: MDD, recurrent, severe Long Term Goal(s): Improvement in symptoms so as ready for discharge Improvement in symptoms so as ready for discharge   Short Term Goals: Ability to identify changes in lifestyle to reduce recurrence of condition will improve Ability to verbalize feelings will improve Ability to disclose and discuss suicidal ideas Ability to demonstrate self-control will improve Ability to identify and develop effective coping behaviors will improve Ability to identify and develop effective coping behaviors will improve Ability to maintain clinical measurements within normal limits will improve Compliance with prescribed medications will improve Ability to identify triggers associated with substance abuse/mental health issues will improve  Medication Management: Evaluate patient's response, side effects, and tolerance of medication regimen.  Therapeutic Interventions: 1 to 1 sessions, Unit Group sessions and Medication administration.  Evaluation of Outcomes: Progressing  Physician Treatment Plan for Secondary Diagnosis: Active Problems:   Major depressive disorder, recurrent severe without psychotic features (HCC)   Benzodiazepine dependence (HCC)  Long Term Goal(s): Improvement in symptoms so as ready for discharge Improvement in symptoms so as ready for discharge   Short Term Goals: Ability to  identify changes in lifestyle to reduce recurrence of condition will improve Ability to verbalize feelings will improve Ability to disclose and discuss suicidal ideas Ability to demonstrate self-control will improve Ability to identify and develop effective coping behaviors will improve Ability to identify and develop effective coping behaviors will improve Ability to maintain clinical measurements within normal limits will improve Compliance with prescribed medications will improve Ability to identify triggers associated with substance abuse/mental health issues will improve     Medication Management: Evaluate patient's response, side effects, and tolerance of medication regimen.  Therapeutic Interventions: 1 to 1 sessions, Unit Group sessions and Medication administration.  Evaluation of Outcomes: Progressing   RN Treatment Plan for Primary Diagnosis: MDD, recurrent, severe Long Term Goal(s): Knowledge of disease and therapeutic regimen to maintain health will improve  Short Term Goals: Ability to remain free from injury will improve, Ability to demonstrate self-control, Ability to disclose and discuss suicidal ideas and Ability to identify and develop effective  coping behaviors will improve  Medication Management: RN will administer medications as ordered by provider, will assess and evaluate patient's response and provide education to patient for prescribed medication. RN will report any adverse and/or side effects to prescribing provider.  Therapeutic Interventions: 1 on 1 counseling sessions, Psychoeducation, Medication administration, Evaluate responses to treatment, Monitor vital signs and CBGs as ordered, Perform/monitor CIWA, COWS, AIMS and Fall Risk screenings as ordered, Perform wound care treatments as ordered.  Evaluation of Outcomes: Progressing   LCSW Treatment Plan for Primary Diagnosis: MDD, recurrent, severe Long Term Goal(s): Safe transition to appropriate next level  of care at discharge, Engage patient in therapeutic group addressing interpersonal concerns.  Short Term Goals: Engage patient in aftercare planning with referrals and resources, Facilitate patient progression through stages of change regarding substance use diagnoses and concerns and Identify triggers associated with mental health/substance abuse issues  Therapeutic Interventions: Assess for all discharge needs, 1 to 1 time with Social worker, Explore available resources and support systems, Assess for adequacy in community support network, Educate family and significant other(s) on suicide prevention, Complete Psychosocial Assessment, Interpersonal group therapy.  Evaluation of Outcomes: Progressing   Progress in Treatment: Attending groups: Yes. Participating in groups: Yes. Taking medication as prescribed: Yes. Toleration medication: Yes. Family/Significant other contact made: Yes, individual(s) contacted:  pt's friend. SPE completed; collateral information obtained. Patient understands diagnosis: Yes. Discussing patient identified problems/goals with staff: Yes. Medical problems stabilized or resolved: Yes. Denies suicidal/homicidal ideation: Yes. Issues/concerns per patient self-inventory: No. Other: n/a   New problem(s) identified: No, Describe:  n/a  New Short Term/Long Term Goal(s): detox, medication management for mood stabilization; elimination of SI thoughts; development of comprehensive mental wellness/sobriety plan.   Patient Goals:  "To get into [facility]. I have to go to treatment before I can go home."   Discharge Plan or Barriers: CSW assessing. Pt hoping to get into [facility] or [facility] post discharge. [facility] for outpatient mental health services. MHAG pamphlet, Mobile Crisis information, and AA/NA information provided to patient for additional community support and resources.   Reason for Continuation of Hospitalization: Anxiety Depression Medication  stabilization Withdrawal symptoms  Estimated Length of Stay: Wed, 02/06/18  Attendees: Patient: 02/04/2018 8:41 AM  Physician: Dr. Jama Flavors MD; Dr. Altamese Euclid MD 02/04/2018 8:41 AM  Nursing: Arlyss Repress RN; [facility] RN 02/04/2018 8:41 AM  RN Care Manager:x 02/04/2018 8:41 AM  Social Worker: Corrie Mckusick LCSW 02/04/2018 8:41 AM  Recreational Therapist: x 02/04/2018 8:41 AM  Other: Hillery Jacks NP; Renold Don NP 02/04/2018 8:41 AM  Other:  02/04/2018 8:41 AM  Other: 02/04/2018 8:41 AM    Scribe for Treatment Team: Rona Ravens, LCSW 02/04/2018 8:41 AM

## 2018-02-04 NOTE — Progress Notes (Signed)
CSW [facility] for [facility].   Charlotte Strong S. Alan RipperHolloway, MSW, LCSW Clinical Social Worker 02/04/2018 9:54 AM

## 2018-02-04 NOTE — Progress Notes (Signed)
Attend group 

## 2018-02-04 NOTE — Plan of Care (Signed)
  Problem: Education: Goal: Verbalization of understanding the information provided will improve Outcome: Progressing   Problem: Activity: Goal: Interest or engagement in activities will improve Outcome: Progressing   Problem: Activity: Goal: Sleeping patterns will improve Outcome: Progressing   Patient was pleasant upon approach this morning. Denies SI HI AVH. Denies physical pain. Withdrawal symptoms are minimal. Patient claimed she didn't sleep well; MD notified.  Patient is compliant with medications prescribed per provider. Safety is maintained with 15 minute checks as we  as environmental checks. Will continue to monitor.

## 2018-02-04 NOTE — Progress Notes (Signed)
Recreation Therapy Notes  Date: 8.26.19 Time: 0930 Location: 300 [address] Dayroom  Group Topic: Stress Management  Goal Area(s) Addresses:  Patient will verbalize importance of using healthy stress management.  Patient will identify positive emotions associated with healthy stress management.   Intervention: Stress Management  Activity :  Guided Imagery.  LRT introduced the stress management technique of guided imagery.  LRT played a meditation on the computer for patients to engage in the activity.  Patients were to follow along as the meditation played.  Education:  Stress Management, Discharge Planning.   Education Outcome: Acknowledges edcuation/In group clarification offered/Needs additional education  Clinical Observations/Feedback: Pt did not attend group.     Jrue Jarriel, LRT/CTRS         Eriverto Byrnes A 02/04/2018 12:13 PM 

## 2018-02-04 NOTE — Progress Notes (Signed)
Pt is observed in the milieu, seen writing in her workbook. Pt appears flat/anxious in affect and mood. Pt denies SI/HI/AVH/Pain at this time. Pt c/o of insomnia. Pt states she would like adjustment to bedtime meds. Pt [facility] PRN vistaril requested and given. Will continue with POC.

## 2018-02-04 NOTE — [facility] Group Notes (Signed)
LCSW Group Therapy Note   02/04/2018 1:15pm   Type of Therapy and Topic:  Group Therapy:  Overcoming Obstacles   Participation Level:  Active   Description of Group:    In this group patients will be encouraged to explore what they see as obstacles to their own wellness and recovery. They will be guided to discuss their thoughts, feelings, and behaviors related to these obstacles. The group will process together ways to cope with barriers, with attention given to specific choices patients can make. Each patient will be challenged to identify changes they are motivated to make in order to overcome their obstacles. This group will be process-oriented, with patients participating in exploration of their own experiences as well as giving and receiving support and challenge from other group members.   Therapeutic Goals: 1. Patient will identify personal and current obstacles as they relate to admission. 2. Patient will identify barriers that currently interfere with their wellness or overcoming obstacles.  3. Patient will identify feelings, thought process and behaviors related to these barriers. 4. Patient will identify two changes they are willing to make to overcome these obstacles:      Summary of Patient Progress   Karsyn was attentive and engaged.  She shared that her biggest obstacle involves remaining sober and getting directly into treatment. Pt is hoping [facility] get [facility] from the [facility] and has been working with CSW to secure placement. "I called admissions today and did my phone screening." SHe continues to show progress with improving insight.    Therapeutic Modalities:   Cognitive Behavioral Therapy Solution Focused Therapy Motivational Interviewing Relapse Prevention Therapy  Rona RavensHeather S Onyx Schirmer, LCSW 02/04/2018 2:44 PM

## 2018-02-04 NOTE — Progress Notes (Signed)
CSW and NP met with pt to discuss aftercare and patient's progress. Pt denies SI/HI/AVH and is interested in [facility] referral with [facility] as plan B. Referral faxed to [facility] in admissions at [facility] and message left for June at [facility] requesting screening appointment. [facility] for outpatient mental health services. Pt is hoping to get into treatment directly from hospital or go to [facility] in [place], [place] to wait for a bed. CSW assessing.  Michell Kader S. Ouida Sills, MSW, LCSW Clinical Social Worker 02/04/2018 10:36 AM
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
## 2018-02-05 DIAGNOSIS — Z590 Homelessness: Secondary | ICD-10-CM

## 2018-02-05 MED ORDER — VENLAFAXINE HCL ER 75 MG PO CP24: 75.0000 mg | ORAL_CAPSULE | Freq: Every day | ORAL | 0 refills | Status: AC

## 2018-02-05 MED ORDER — ALBUTEROL SULFATE HFA 108 (90 BASE) MCG/ACT IN AERS: 1.0000 | INHALATION_SPRAY | Freq: Four times a day (QID) | RESPIRATORY_TRACT | Status: AC | PRN

## 2018-02-05 MED ORDER — NICOTINE POLACRILEX 2 MG MT GUM: 2.0000 mg | CHEWING_GUM | OROMUCOSAL | 0 refills | Status: AC | PRN

## 2018-02-05 MED ORDER — HYDROXYZINE HCL 25 MG PO TABS: 25.0000 mg | ORAL_TABLET | Freq: Four times a day (QID) | ORAL | 0 refills | Status: AC | PRN

## 2018-02-05 MED ORDER — TRAZODONE HCL 100 MG PO TABS
100.0000 mg | ORAL_TABLET | Freq: Every evening | ORAL | Status: DC | PRN
  Administered 2018-02-05: 100 mg via ORAL
  Filled 2018-02-05 (×3): qty 1

## 2018-02-05 MED ORDER — GABAPENTIN 100 MG PO CAPS: 100.0000 mg | ORAL_CAPSULE | Freq: Three times a day (TID) | ORAL | 0 refills | Status: AC

## 2018-02-05 MED ORDER — MIRTAZAPINE 7.5 MG PO TABS: 7.5000 mg | ORAL_TABLET | Freq: Every day | ORAL | 0 refills | Status: AC

## 2018-02-05 MED ORDER — OLANZAPINE 5 MG PO TABS: 5.0000 mg | ORAL_TABLET | Freq: Every day | ORAL | 0 refills | Status: AC

## 2018-02-05 MED ORDER — TRAZODONE HCL 100 MG PO TABS: 100.0000 mg | ORAL_TABLET | Freq: Every evening | ORAL | 0 refills | Status: AC | PRN

## 2018-02-05 NOTE — Plan of Care (Signed)
  Problem: Health Behavior/Discharge Planning: Goal: Compliance with treatment plan for underlying cause of condition will improve Outcome: Progressing  Patient is attending groups and taking medications as prescribed.   Problem: Safety: Goal: Periods of time without injury will increase Outcome: Progressing   Patient is on q15 minute safety checks, low fall risk precautions and contracts for safety on the unit. 

## 2018-02-05 NOTE — Progress Notes (Signed)
Patient ID: Charlotte Strong, female   DOB: [birth year], 42 y.o.   MRN: [id number]  Nursing Progress Note [id number]  Data: Patient presents mildly anxious but is pleasant and cooperative this morning. Patient complaint with scheduled medications. Patient denies pain/physical complaints. Patient reports she is [facility] to [facility] or tomorrow. Patient completed self-inventory sheet and rates depression, hopelessness, and anxiety 0,0,0 respectively. Patient rates their sleep and appetite as good/good respectively. Patient states goal for today is [facility] Patient is seen attending groups and visible in the milieu. Patient currently denies SI/HI/AVH.  Action: Patient educated about and provided medication per provider's orders. Patient safety maintained with q15 min safety checks and frequent rounding. Low fall risk precautions in place. Emotional support given. 1:1 interaction and active listening provided. Patient encouraged to attend meals and groups. Patient encouraged to work on treatment plan and goals. Labs, vital signs and patient behavior monitored throughout shift.   Response: Patient agrees to come to staff if any thoughts of SI/HI develop or if patient develops intention of acting on thoughts. Patient remains safe on the unit at this time. Patient is interacting with peers appropriately on the unit. Will continue to support and monitor.
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
## 2018-02-05 NOTE — [facility] Group Notes (Signed)
[facility] Group Therapy 02/05/2018 1:15pm  Type of Therapy: Mental Health Association Presentation  Participation Level: Active  Participation Quality: Attentive  Affect: Appropriate  Cognitive: Oriented  Insight: Developing/Improving  Engagement in Therapy: Engaged  Modes of Intervention: Discussion, Education and Socialization  Summary of Progress/Problems: Mental Health Association (MHA) Speaker came to talk about his personal journey with mental health. The pt processed ways by which to relate to the speaker. MHA speaker provided handouts and educational information pertaining to groups and services offered by the MHA. Pt was engaged in speaker'Charlotte presentation and was receptive to resources provided.    Charlotte Strong Charlotte Mackinley Cassaday, LCSW 02/05/2018 3:50 PM  

## 2018-02-05 NOTE — Progress Notes (Signed)
Recreation Therapy Notes  Animal-Assisted Activity (AAA) Program Checklist/Progress Notes Patient Eligibility Criteria Checklist & Daily Group note for Rec Tx Intervention  Date: 8.27.19 Time: 1430 [address]   AAA/T Program Assumption of Risk Form signed by Engineer, productionatient/ or Parent Legal Guardian YES   Patient is free of allergies or sever asthma  YES   Patient reports no fear of animals  YES  Patient reports no history of cruelty to animals YES   Patient understands his/her participation is voluntary YES   Patient washes hands before animal contact YES   Patient washes hands after animal contact  YES   Education: Charity fundraiserHand Washing, Appropriate Animal Interaction   Education Outcome: Acknowledges understanding/In group clarification offered/Needs additional education.   Clinical Observations/Feedback: Pt did not attend group.    Caroll RancherMarjette Mechele Kittleson, LRT/CTRS         Caroll RancherLindsay, Catricia Scheerer A 02/05/2018 3:55 PM

## 2018-02-05 NOTE — Progress Notes (Signed)
  [facility] Adult Case Management Discharge Plan :  Will you be returning to the same living situation after discharge:  No.[facility] has accepted pt.  At discharge, do you have transportation home?: Yes,  [facility] driver will pick up pt at 10am on Wed, 8/28 Do you have the ability to pay for your medications: Yes,  mental health  Release of information consent forms completed and submitted to medical records by CSW.  Patient to Follow up at: Follow-up Information    [facility] Follow up on 02/06/2018.   Specialty:  Addiction Medicine Why:  [facility]. Driver will pick you up at this time to transport you to facility. You will be provided with 21 day supply of medications.  Contact information: [address] [place] [address] [phone]        [facility] Follow up.   Specialty:  Behavioral Health Why:  Please follow-up within three days of hospital/rehab discharge to be assessed for outpatient mental health services including medication management/therapy. Walk in hours: Monday-Friday 8am-10am. Thank you.  Contact information: [address] [place] [address] [phone]           Next level of care provider has access to [facility] Link:no  Safety Planning and Suicide Prevention discussed: Yes,  SPE completed with pt's friend and with pt. SPI pamphlet and Mobile Crisis information provided to pt.   Have you used any form of tobacco in the last 30 days? (Cigarettes, Smokeless Tobacco, Cigars, and/or Pipes): Yes  Has patient been referred to the Quitline?: Patient refused referral  Patient has been referred for addiction treatment: Yes  Rona RavensHeather S Alexya Mcdaris, LCSW 02/05/2018, 9:03 AM
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
## 2018-02-06 NOTE — Progress Notes (Signed)
Discharge note: Patient reviewed discharge paperwork with RN including prescriptions, follow up appointments, and lab work. Patient given the opportunity to ask questions. All concerns were addressed. All belongings were returned to patient. Denied SI/HI/AVH. Patient thanked staff for their care while at the hospital.  Patient was discharged to lobby where an [employer] driver was waiting to pick her up.

## 2018-02-06 NOTE — Progress Notes (Signed)
Pt reports she is doing well and having a good day.  She states she is supposed to discharge today [facility].  She denies SI/HI/AVH at this time.  She voices no needs or concerns other than being able to get Trazodone for sleep tonight.  Pt has been pleasant and cooperative.  She is appropriate on the unit.  Support and encouragement offered.  Pt medicated per orders.  Discharge plans are in process.  Safety maintained with q15 minute checks.
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
## 2018-02-06 NOTE — Plan of Care (Signed)
  Problem: Education: Goal: Mental status will improve Outcome: Adequate for Discharge   Problem: Education: Goal: Verbalization of understanding the information provided will improve Outcome: Adequate for Discharge   Problem: Activity: Goal: Interest or engagement in activities will improve Outcome: Adequate for Discharge   Problem: Activity: Goal: Sleeping patterns will improve Outcome: Adequate for Discharge   Patient was pleasant and cooperative upon morning med pass. Patient states she is anxious yet [facility]. Denies physical pain. Denies SI HI AVH. Patient is compliant with medication administration prescribed per provider. Safety is maintained with 15 minute checks as well as environmental checks. Will continue to monitor.

## 2018-02-06 NOTE — BHH Suicide Risk Assessment (Signed)
[facility] Discharge Suicide Risk Assessment   Principal Problem: <principal problem not specified> Discharge Diagnoses:  Patient Active Problem List   Diagnosis Date Noted  . Benzodiazepine dependence (HCC) [F13.20]   . Major depressive disorder, recurrent severe without psychotic features (HCC) [F33.2] 02/01/2018  . MDD (major depressive disorder), recurrent severe, without psychosis (HCC) [F33.2] 11/04/2017  . Polysubstance dependence (HCC) [F19.20]   . Chronic hepatitis C without hepatic coma (HCC) [B18.2]   . MDD (major depressive disorder), recurrent episode, severe (HCC) [F33.2] 10/12/2017  . GERD (gastroesophageal reflux disease) [K21.9] 09/15/2017  . Major depressive disorder, single episode, severe without psychosis (HCC) [F32.2] 09/13/2017  . Opioid use disorder, severe, dependence (HCC) [F11.20] 09/24/2015  . Polysubstance abuse (HCC) [F19.10]   . Suicidal ideation [R45.851]   . Left foot drop [M21.372] 12/01/2014  . Left wrist drop [M21.332] 12/01/2014  . Substance induced mood disorder (HCC) [F19.94] 10/13/2014  . 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 Compartment syndrome of lower extremity (HCC) [T79.A29A] 10/08/2014  . Wheezing [R06.2]   . Neuropathic pain [M79.2]   . Depression with anxiety [F41.8]   . Palliative care encounter [Z51.5]   . Pain [R52]   . Encephalopathy acute [G93.40]   . Aspiration pneumonia (HCC) [J69.0]   . Altered mental status [R41.82] 09/30/2014  . Acute renal failure (HCC) [N17.9] 09/30/2014  . Rhabdomyolysis [M62.82] 09/30/2014  . Acute respiratory failure with hypoxia (HCC) [J96.01] 09/30/2014  . Lactic acidosis [E87.2] 09/30/2014    Total Time spent with patient: 15 minutes  Musculoskeletal: Strength & Muscle Tone: within normal limits Gait & Station: normal Patient leans: N/A  Psychiatric Specialty Exam: Review of Systems  All other systems reviewed and are negative.   Blood pressure 128/82, pulse 93,  temperature 97.6 F (36.4 C), temperature source Oral, resp. rate 20, height 5\' 5"  (1.651 m), weight 56.2 kg, SpO2 99 %.Body mass index is 20.63 kg/m.  General Appearance: Casual  Eye Contact::  Fair  Speech:  Normal Rate409  Volume:  Normal  Mood:  Anxious  Affect:  Congruent  Thought Process:  Coherent and Descriptions of Associations: Intact  Orientation:  Full (Time, Place, and Person)  Thought Content:  Logical  Suicidal Thoughts:  No  Homicidal Thoughts:  No  Memory:  Immediate;   Fair Recent;   Fair Remote;   Fair  Judgement:  Intact  Insight:  Fair  Psychomotor Activity:  Increased  Concentration:  Good  Recall:  Fair  Fund of Knowledge:Fair  Language: Good  Akathisia:  Negative  Handed:  Right  AIMS (if indicated):     Assets:  Desire for Improvement Physical Health Resilience  Sleep:  Number of Hours: 6.25  Cognition: WNL  ADL's:  Intact   Mental Status Per Nursing Assessment::   On Admission:  Suicide plan, Self-harm thoughts  Demographic Factors:  Caucasian, Low socioeconomic status and Unemployed  Loss Factors: NA  Historical Factors: Impulsivity  Risk Reduction Factors:   Sense of responsibility to family and Positive social support  Continued Clinical Symptoms:  Depression:   Impulsivity Alcohol/Substance Abuse/Dependencies  Cognitive Features That Contribute To Risk:  None    Suicide Risk:  Minimal: No identifiable suicidal ideation.  Patients presenting with no risk factors but with morbid ruminations; may be classified as minimal risk based on the severity of the depressive symptoms  Follow-up Information    [facility] Follow up on 02/06/2018.   Specialty:  Addiction Medicine Why:  [facility]. Driver will pick you up at this time to transport you to facility. You will be provided with 21 day supply of medications.  Contact information: [address] [place]  [id number] [phone]        [facility] Follow up.   Specialty:  Behavioral Health Why:  Please follow-up within three days of hospital/rehab discharge to be assessed for outpatient mental health services including medication management/therapy. Walk in hours: Monday-Friday 8am-10am. Thank you.  Contact information: [address] [place] [address] [phone]           Plan Of Care/Follow-up recommendations:  Activity:  ad lib  Antonieta PertGreg Lawson Clary, MD 02/06/2018, 7:42 AM

## 2018-06-10 IMAGING — DX DG CHEST 1V PORT
1 series · 1 of 1 positions shown · non-contrast
Comparison: Chest radiograph dated 10/14/2014

CLINICAL DATA: 41-year-old female with hypoxia.

EXAM:
PORTABLE CHEST 1 VIEW

[chest ap]
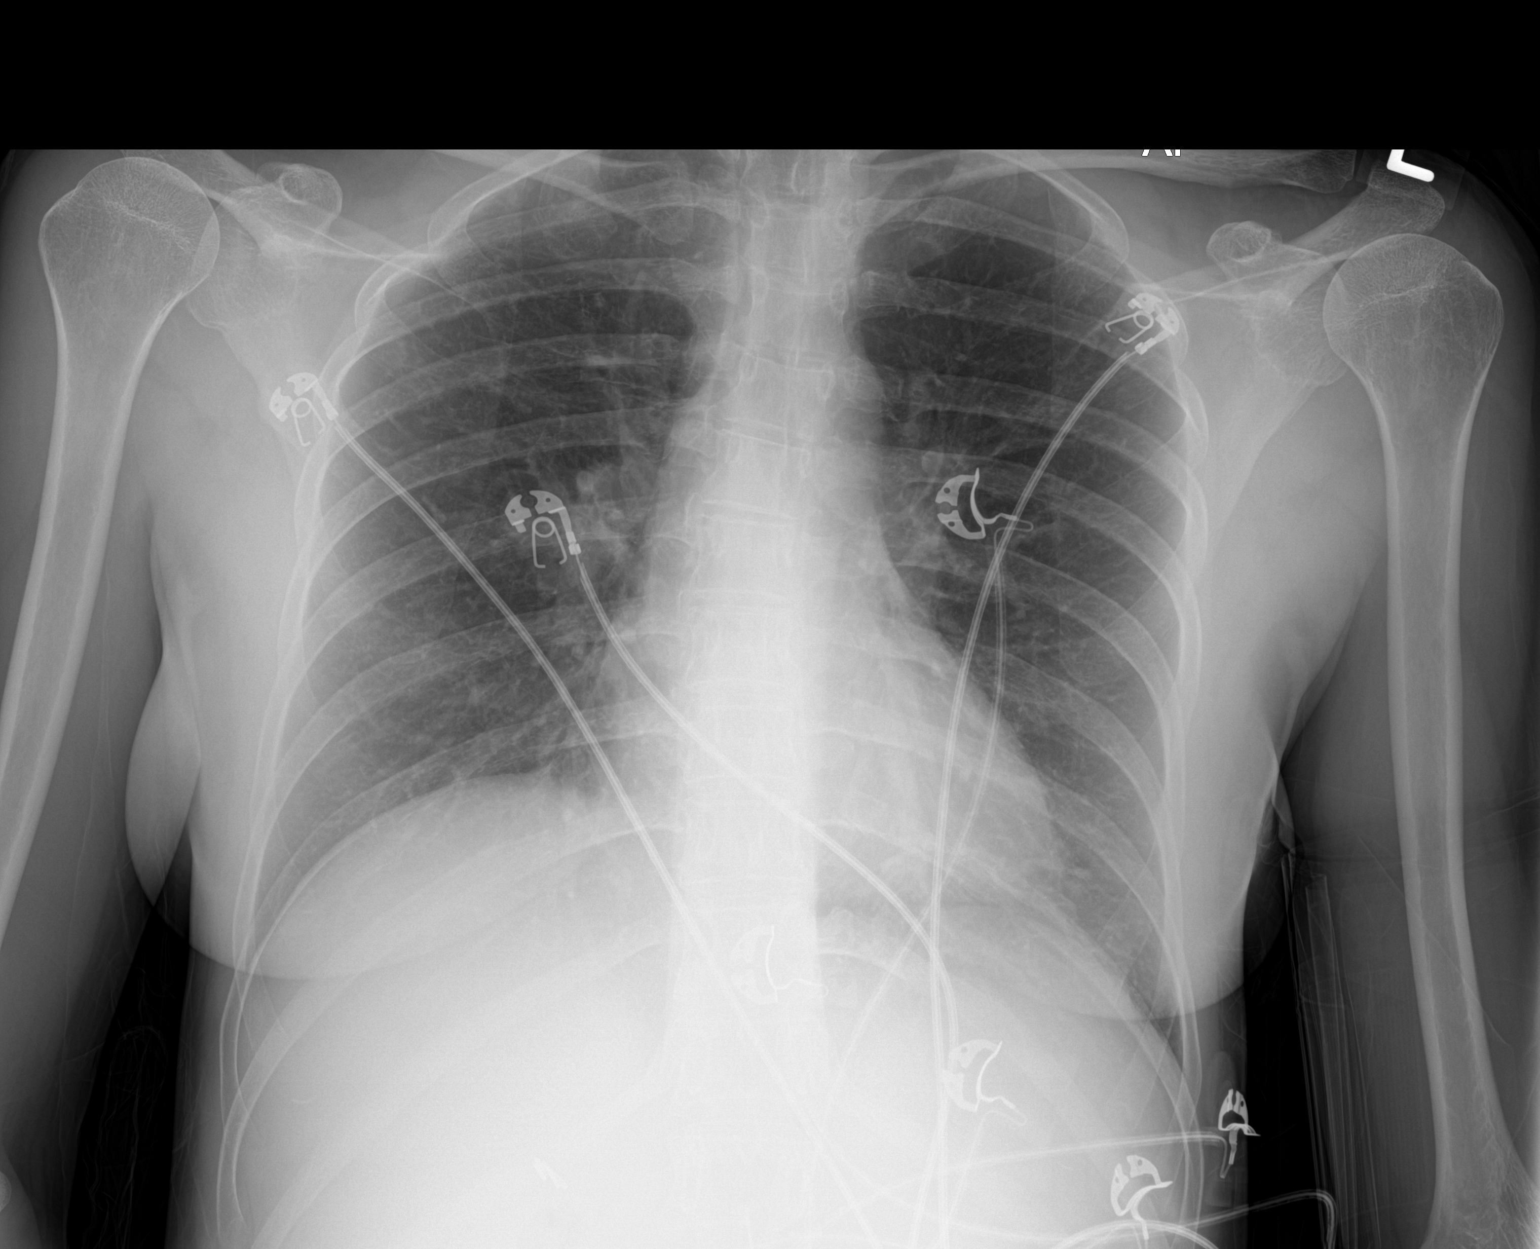

[1 of 1 positions shown; findings below may reference images not displayed]

FINDINGS: The heart size and mediastinal contours are within normal limits.
Both lungs are clear. The visualized skeletal structures are
unremarkable.
IMPRESSION: No active disease.

## 2018-09-27 ENCOUNTER — Encounter: Payer: Self-pay | Admitting: *Deleted

## 2018-09-27 NOTE — Congregational Nurse Program (Signed)
COVID 19 Hotel Screening: Patient states she has depression/anxiety, Patient states she has 20 trazadone pills remaining but is in need of her Effexor 75mg . Currently she cannot afford the medications and is feeling the effects of not having the medication. Referral has been sent to [facility] for follow up and case management assitance.
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
## 2018-10-03 ENCOUNTER — Telehealth: Payer: Self-pay | Admitting: Pediatric Intensive Care

## 2018-10-03 NOTE — Telephone Encounter (Signed)
Left HIPAA compliant message for client to return call. Berta Denson RN BSN CNP [phone] 

## 2018-10-04 NOTE — Progress Notes (Signed)
COVID Hotel Screening performed. Temperature, PHQ-9, and need for medical care and medications assessed. PHQ-9 = 12. Patient given copy of [facility].  Patient reports being on Trazedone and needing a refill.  Referral made to [facility]  Carlyle Basques RN MSN

## 2018-10-07 ENCOUNTER — Telehealth: Payer: Self-pay | Admitting: Pediatric Intensive Care

## 2018-10-07 NOTE — Telephone Encounter (Signed)
CN left HIPAA compliant message to return call at [phone]. Shann Medal RN BSN CNP [phone]

## 2018-10-09 NOTE — Progress Notes (Signed)
COVID Hotel Screening performed. Temperature, PHQ-9, and need for medical care and medications assessed. No additional needs assessed at this time.  Najiyah Paris RN MSN 

## 2018-10-14 NOTE — Congregational Nurse Program (Signed)
Closing encounter per request. 

## 2018-10-15 ENCOUNTER — Telehealth: Payer: Self-pay | Admitting: Pediatric Intensive Care

## 2018-10-15 NOTE — Telephone Encounter (Signed)
Left HIPAA compliant message to return call at [phone] regarding referral. Shann Medal RN BSN CNP [phone]

## 2018-10-27 NOTE — Progress Notes (Signed)
COVID Hotel Screening performed. Temperature, PHQ-9, and need for medical care and medications assessed. Patient is out of medications and does not have a provider or way to pay for medications. Was taking Effexor 75mg  and Trazodone 100mg . Request assistance with finding a provider, getting medicaid, and getting medications. [facility].  Carlyle Basques RN MSN

## 2018-10-30 ENCOUNTER — Telehealth: Payer: Self-pay | Admitting: Pediatric Intensive Care

## 2018-10-30 NOTE — Telephone Encounter (Signed)
Call to [facility] to reach client regarding referral. Front desk staff stated client is no longer at hotel. Shann Medal RN BSN CNP [phone]

## 2018-10-30 NOTE — Telephone Encounter (Signed)
Left HIPAA compliant message to return call at [phone] regarding referral. Imani Sherrin RN BSN CNP [phone] 

## 2021-11-06 ENCOUNTER — Emergency Department (HOSPITAL_COMMUNITY)
Admission: EM | Admit: 2021-11-06 | Discharge: 2021-11-06 | Disposition: A | Discharge status: Home / Self Care | Payer: Self-pay | Attending: Emergency Medicine | Admitting: Emergency Medicine

## 2021-11-06 ENCOUNTER — Other Ambulatory Visit: Payer: Self-pay

## 2021-11-06 ENCOUNTER — Encounter (HOSPITAL_COMMUNITY): Payer: Self-pay | Admitting: Emergency Medicine

## 2021-11-06 DIAGNOSIS — T7411XA Adult physical abuse, confirmed, initial encounter: Secondary | ICD-10-CM | POA: Insufficient documentation

## 2021-11-06 DIAGNOSIS — Z5982 Transportation insecurity: Secondary | ICD-10-CM | POA: Insufficient documentation

## 2021-11-06 DIAGNOSIS — S80211A Abrasion, right knee, initial encounter: Secondary | ICD-10-CM | POA: Insufficient documentation

## 2021-11-06 DIAGNOSIS — S80212A Abrasion, left knee, initial encounter: Secondary | ICD-10-CM | POA: Insufficient documentation

## 2021-11-06 DIAGNOSIS — Z748 Other problems related to care provider dependency: Secondary | ICD-10-CM

## 2021-11-06 DIAGNOSIS — S2001XA Contusion of right breast, initial encounter: Secondary | ICD-10-CM | POA: Insufficient documentation

## 2021-11-06 DIAGNOSIS — S7011XA Contusion of right thigh, initial encounter: Secondary | ICD-10-CM | POA: Insufficient documentation

## 2021-11-06 NOTE — ED Notes (Addendum)
Pt not present in assessment room to provide discharge instructions. Pt seen by SW and given bus pass.

## 2021-11-06 NOTE — ED Notes (Signed)
Pt safely ambulated to room from triage
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
## 2021-11-06 NOTE — Care Management (Addendum)
Spoke to patient about transportation concerns. She first stated she could call someone then spoke about [facility] bus. We only have   [employer] ticket has on ride and transfer.Given to patient. Patient called her SO and left message. Ambulated to BR, shaky but coordinated. Crying periodically.

## 2022-01-16 ENCOUNTER — Encounter (HOSPITAL_COMMUNITY): Payer: Self-pay

## 2022-01-16 ENCOUNTER — Other Ambulatory Visit: Payer: Self-pay

## 2022-01-16 ENCOUNTER — Emergency Department (HOSPITAL_COMMUNITY): Payer: Self-pay

## 2022-01-16 ENCOUNTER — Emergency Department (HOSPITAL_COMMUNITY)
Admission: EM | Admit: 2022-01-16 | Discharge: 2022-01-16 | Disposition: A | Discharge status: Home / Self Care | Payer: Self-pay | Attending: Emergency Medicine | Admitting: Emergency Medicine

## 2022-01-16 DIAGNOSIS — M545 Low back pain, unspecified: Secondary | ICD-10-CM | POA: Insufficient documentation

## 2022-01-16 DIAGNOSIS — S7002XA Contusion of left hip, initial encounter: Secondary | ICD-10-CM | POA: Diagnosis not present

## 2022-01-16 DIAGNOSIS — Z87891 Personal history of nicotine dependence: Secondary | ICD-10-CM | POA: Insufficient documentation

## 2022-01-16 DIAGNOSIS — R051 Acute cough: Secondary | ICD-10-CM

## 2022-01-16 DIAGNOSIS — Y92410 Unspecified street and highway as the place of occurrence of the external cause: Secondary | ICD-10-CM | POA: Diagnosis not present

## 2022-01-16 DIAGNOSIS — R059 Cough, unspecified: Secondary | ICD-10-CM | POA: Insufficient documentation

## 2022-01-16 DIAGNOSIS — S79912A Unspecified injury of left hip, initial encounter: Secondary | ICD-10-CM | POA: Diagnosis present

## 2022-01-16 MED ORDER — ALBUTEROL SULFATE HFA 108 (90 BASE) MCG/ACT IN AERS
2.0000 | INHALATION_SPRAY | Freq: Once | RESPIRATORY_TRACT | Status: AC
  Administered 2022-01-16: 2 via RESPIRATORY_TRACT
  Filled 2022-01-16: qty 6.7
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
## 2023-11-28 ENCOUNTER — Emergency Department (HOSPITAL_COMMUNITY)
Admission: EM | Admit: 2023-11-28 | Discharge: 2023-11-29 | Disposition: A | Discharge status: Home / Self Care | Payer: MEDICAID | Attending: Emergency Medicine | Admitting: Emergency Medicine

## 2023-11-28 DIAGNOSIS — T424X4A Poisoning by benzodiazepines, undetermined, initial encounter: Secondary | ICD-10-CM | POA: Insufficient documentation

## 2023-11-28 DIAGNOSIS — T50904A Poisoning by unspecified drugs, medicaments and biological substances, undetermined, initial encounter: Secondary | ICD-10-CM

## 2023-11-28 DIAGNOSIS — R4182 Altered mental status, unspecified: Secondary | ICD-10-CM | POA: Diagnosis present

## 2023-11-28 LAB — CBC WITH DIFFERENTIAL/PLATELET
Abs Immature Granulocytes: 0.03 10*3/uL (ref 0.00–0.07)
Basophils Absolute: 0.1 10*3/uL (ref 0.0–0.1)
Basophils Relative: 1 %
Eosinophils Absolute: 0.2 10*3/uL (ref 0.0–0.5)
Eosinophils Relative: 2 %
HCT: 42.1 % (ref 36.0–46.0)
Hemoglobin: 14 g/dL (ref 12.0–15.0)
Immature Granulocytes: 0 %
Lymphocytes Relative: 36 %
Lymphs Abs: 2.9 10*3/uL (ref 0.7–4.0)
MCH: 31.7 pg (ref 26.0–34.0)
MCHC: 33.3 g/dL (ref 30.0–36.0)
MCV: 95.5 fL (ref 80.0–100.0)
Monocytes Absolute: 0.5 10*3/uL (ref 0.1–1.0)
Monocytes Relative: 6 %
Neutro Abs: 4.4 10*3/uL (ref 1.7–7.7)
Neutrophils Relative %: 55 %
Platelets: 302 10*3/uL (ref 150–400)
RBC: 4.41 MIL/uL (ref 3.87–5.11)
RDW: 12.3 % (ref 11.5–15.5)
WBC: 8.1 10*3/uL (ref 4.0–10.5)
nRBC: 0 % (ref 0.0–0.2)

## 2023-11-28 LAB — RAPID URINE DRUG SCREEN, HOSP PERFORMED
Amphetamines: NOT DETECTED
Barbiturates: NOT DETECTED
Benzodiazepines: POSITIVE — AB
Cocaine: NOT DETECTED
Opiates: NOT DETECTED
Tetrahydrocannabinol: POSITIVE — AB

## 2023-11-28 LAB — URINALYSIS, W/ REFLEX TO CULTURE (INFECTION SUSPECTED)
Bilirubin Urine: NEGATIVE
Glucose, UA: NEGATIVE mg/dL
Hgb urine dipstick: NEGATIVE
Ketones, ur: NEGATIVE mg/dL
Nitrite: POSITIVE — AB
Protein, ur: NEGATIVE mg/dL
Specific Gravity, Urine: 1.009 (ref 1.005–1.030)
pH: 7 (ref 5.0–8.0)

## 2023-11-28 LAB — COMPREHENSIVE METABOLIC PANEL WITH GFR
ALT: 19 U/L (ref 0–44)
AST: 27 U/L (ref 15–41)
Albumin: 4 g/dL (ref 3.5–5.0)
Alkaline Phosphatase: 65 U/L (ref 38–126)
Anion gap: 14 (ref 5–15)
BUN: 15 mg/dL (ref 6–20)
CO2: 25 mmol/L (ref 22–32)
Calcium: 9.3 mg/dL (ref 8.9–10.3)
Chloride: 99 mmol/L (ref 98–111)
Creatinine, Ser: 0.81 mg/dL (ref 0.44–1.00)
GFR, Estimated: >60 mL/min (ref >60)
Glucose, Bld: 95 mg/dL (ref 70–99)
Potassium: 3.4 mmol/L — ABNORMAL LOW (ref 3.5–5.1)
Sodium: 138 mmol/L (ref 135–145)
Total Bilirubin: 0.7 mg/dL (ref 0.0–1.2)
Total Protein: 7.4 g/dL (ref 6.5–8.1)

## 2023-11-28 LAB — SALICYLATE LEVEL: Salicylate Lvl: <7 mg/dL — ABNORMAL LOW (ref 7.0–30.0)

## 2023-11-28 LAB — ACETAMINOPHEN LEVEL: Acetaminophen (Tylenol), Serum: <10 ug/mL — ABNORMAL LOW (ref 10–30)

## 2023-11-28 LAB — CBG MONITORING, ED: Glucose-Capillary: 96 mg/dL (ref 70–99)

## 2023-11-28 LAB — ETHANOL: Alcohol, Ethyl (B): <15 mg/dL (ref <15)

## 2023-11-28 LAB — HCG, SERUM, QUALITATIVE: Preg, Serum: NEGATIVE
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
## 2023-11-28 NOTE — ED Provider Notes (Signed)
 [place] EMERGENCY DEPARTMENT AT [facility] Provider Note   CSN: [id number] Arrival date & time: 11/28/23  1910     Patient presents with: Altered Mental Status   Charlotte Strong is a 48 y.o. female.   Patient presents after being found with decreased level of consciousness.  Concern for possible overdose.  Patient given Narcan with some relief.  Then she became combative and required Versed 5 mg as well as restraint.  Patient eventually was arousable here and admits that she took 4 mg of Xanax.  States that this was not an overdose attempt.       Prior to Admission medications   Not on File    Allergies: Patient has no allergy information on record.    Review of Systems  Unable to perform ROS: Acuity of condition    Updated Vital Signs BP (!) 130/90   Pulse 65   Resp (!) 27   SpO2 97%   Physical Exam Vitals and nursing note reviewed.  Constitutional:      General: She is not in acute distress.    Appearance: Normal appearance. She is well-developed. She is not toxic-appearing.  HENT:     Head: Normocephalic and atraumatic.   Eyes:     General: Lids are normal.     Conjunctiva/sclera: Conjunctivae normal.     Pupils: Pupils are equal, round, and reactive to light.   Neck:     Thyroid: No thyroid mass.     Trachea: No tracheal deviation.   Cardiovascular:     Rate and Rhythm: Normal rate and regular rhythm.     Heart sounds: Normal heart sounds. No murmur heard.    No gallop.  Pulmonary:     Effort: Pulmonary effort is normal. No respiratory distress.     Breath sounds: Normal breath sounds. No stridor. No decreased breath sounds, wheezing, rhonchi or rales.  Abdominal:     General: There is no distension.     Palpations: Abdomen is soft.     Tenderness: There is no abdominal tenderness. There is no rebound.   Musculoskeletal:        General: No tenderness. Normal range of motion.     Cervical back: Normal range of motion and neck supple.    Skin:    General: Skin is warm and dry.     Findings: No abrasion or rash.   Neurological:     Mental Status: She is oriented to person, place, and time. She is lethargic.     GCS: GCS eye subscore is 4. GCS verbal subscore is 5. GCS motor subscore is 6.     Cranial Nerves: No cranial nerve deficit.     Sensory: No sensory deficit.     Motor: Motor function is intact.     Comments: Uncooperative with exam.  Patient moves all 4 extremities.  Psychiatric:        Attention and Perception: She is inattentive.        Mood and Affect: Affect is labile.        Speech: Speech is rapid and pressured.        Behavior: Behavior is uncooperative and agitated.     (all labs ordered are listed, but only abnormal results are displayed) Labs Reviewed  CBC WITH DIFFERENTIAL/PLATELET  COMPREHENSIVE METABOLIC PANEL WITH GFR  ETHANOL  RAPID URINE DRUG SCREEN, HOSP PERFORMED  HCG, SERUM, QUALITATIVE  URINALYSIS, W/ REFLEX TO CULTURE (INFECTION SUSPECTED)  ACETAMINOPHEN  LEVEL  SALICYLATE LEVEL  EKG: EKG Interpretation Date/Time:  Wednesday November 28 2023 19:18:26 EDT Ventricular Rate:  65 PR Interval:  150 QRS Duration:  86 QT Interval:  431 QTC Calculation: 449 R Axis:   56  Text Interpretation: Sinus rhythm Atrial premature complex Confirmed by Dasie Faden ([id number]) on 11/28/2023 7:37:58 PM  Radiology: No results found.   Procedures   Medications Ordered in the ED - No data to display                                  Medical Decision Making Amount and/or Complexity of Data Reviewed Labs: ordered.   Patient is EKG shows normal sinus rhythm.  Patient was combative here initially but then gradually improved.  She admits that she took 4 mg of Xanax and states that she did this to become intoxicated.  Denies that this was a suicide attempt.  Patient will be allowed to metabolize.  She has been able to protect her airway on multiple reassessments.  Signed over to Dr. Carita    CRITICAL CARE Performed by: Faden ONEIDA Dasie Total critical care time: 60 minutes Critical care time was exclusive of separately billable procedures and treating other patients. Critical care was necessary to treat or prevent imminent or life-threatening deterioration. Critical care was time spent personally by me on the following activities: development of treatment plan with patient and/or surrogate as well as nursing, discussions with consultants, evaluation of patient's response to treatment, examination of patient, obtaining history from patient or surrogate, ordering and performing treatments and interventions, ordering and review of laboratory studies, ordering and review of radiographic studies, pulse oximetry and re-evaluation of patient's condition.      Final diagnoses:  None    ED Discharge Orders     None          Dasie Faden, MD 11/28/23 2336

## 2023-11-29 ENCOUNTER — Emergency Department (HOSPITAL_COMMUNITY): Payer: MEDICAID

## 2023-11-29 MED ORDER — NITROFURANTOIN MONOHYD MACRO 100 MG PO CAPS
100.0000 mg | ORAL_CAPSULE | Freq: Two times a day (BID) | ORAL | 0 refills | Status: AC
Start: 2023-11-29 — End: ?

## 2023-11-29 NOTE — Discharge Instructions (Addendum)
 Stop using drugs that are not prescribed to you.  Follow-up with your doctor.  Return to the ED with new or worsening symptoms peer

## 2023-11-29 NOTE — ED Notes (Signed)
 Back from CT

## 2023-11-29 NOTE — ED Notes (Addendum)
 Pt alert and oriented walking around room. Given sandwich and water  per request. MD notified.

## 2023-11-29 NOTE — ED Provider Notes (Signed)
 Care assumed from Dr. Leighton Punches.  Patient with a probable overdose of Xanax .  Did require Versed  from EMS and restraints.  Awaiting sobriety.  At 4:30 AM, patient is awake and alert.  She is tolerating p.o. and ambulatory.  She is oriented x 3.  She does admit to taking Xanax  but states it was not a suicide attempt and she does use Xanax  recreationally.  She denies any pain.  She is oriented x 3 and appears stable for discharge.  UDS positive for THC and benzos.  Does have positive nitrite and leukocytes but denies any UTI symptoms.  Will send for culture.  Labs otherwise reassuring.   [name], MD 11/29/23 [phone]
